# Patient Record
Sex: Female | Born: 1935 | Race: White | Hispanic: No | Smoking: Never smoker
Health system: Southern US, Community
[De-identification: ages and names within clinical notes are randomized; demographics above are authoritative.]

## PROBLEM LIST (undated history)

## (undated) DIAGNOSIS — R011 Cardiac murmur, unspecified: Secondary | ICD-10-CM

## (undated) DIAGNOSIS — E119 Type 2 diabetes mellitus without complications: Secondary | ICD-10-CM

## (undated) DIAGNOSIS — I10 Essential (primary) hypertension: Secondary | ICD-10-CM

## (undated) DIAGNOSIS — R42 Dizziness and giddiness: Secondary | ICD-10-CM

## (undated) DIAGNOSIS — I350 Nonrheumatic aortic (valve) stenosis: Secondary | ICD-10-CM

## (undated) DIAGNOSIS — K226 Gastro-esophageal laceration-hemorrhage syndrome: Secondary | ICD-10-CM

## (undated) DIAGNOSIS — M810 Age-related osteoporosis without current pathological fracture: Secondary | ICD-10-CM

## (undated) DIAGNOSIS — D62 Acute posthemorrhagic anemia: Secondary | ICD-10-CM

## (undated) HISTORY — DX: Nonrheumatic aortic (valve) stenosis: I35.0

## (undated) HISTORY — PX: CHOLECYSTECTOMY: SHX55

## (undated) HISTORY — PX: COLONOSCOPY: SHX174

## (undated) HISTORY — PX: TUBAL LIGATION: SHX77

## (undated) HISTORY — PX: SKIN CANCER EXCISION: SHX779

---

## 1998-08-11 ENCOUNTER — Other Ambulatory Visit: Admission: RE | Admit: 1998-08-11 | Discharge: 1998-08-11 | Payer: Self-pay | Admitting: Family Medicine

## 1999-06-21 ENCOUNTER — Encounter: Admission: RE | Admit: 1999-06-21 | Discharge: 1999-06-21 | Payer: Self-pay | Admitting: Family Medicine

## 1999-06-21 ENCOUNTER — Encounter: Payer: Self-pay | Admitting: Family Medicine

## 1999-08-15 ENCOUNTER — Encounter: Admission: RE | Admit: 1999-08-15 | Discharge: 1999-08-15 | Payer: Self-pay | Admitting: Family Medicine

## 1999-08-15 ENCOUNTER — Encounter: Payer: Self-pay | Admitting: Family Medicine

## 2000-06-24 ENCOUNTER — Encounter: Payer: Self-pay | Admitting: Family Medicine

## 2000-06-24 ENCOUNTER — Encounter: Admission: RE | Admit: 2000-06-24 | Discharge: 2000-06-24 | Payer: Self-pay | Admitting: Family Medicine

## 2000-08-06 ENCOUNTER — Other Ambulatory Visit: Admission: RE | Admit: 2000-08-06 | Discharge: 2000-08-06 | Payer: Self-pay | Admitting: Family Medicine

## 2001-06-25 ENCOUNTER — Encounter: Admission: RE | Admit: 2001-06-25 | Discharge: 2001-06-25 | Payer: Self-pay | Admitting: Family Medicine

## 2001-06-25 ENCOUNTER — Encounter: Payer: Self-pay | Admitting: Family Medicine

## 2001-08-12 ENCOUNTER — Other Ambulatory Visit: Admission: RE | Admit: 2001-08-12 | Discharge: 2001-08-12 | Payer: Self-pay | Admitting: Family Medicine

## 2001-08-15 ENCOUNTER — Encounter: Payer: Self-pay | Admitting: Family Medicine

## 2001-08-15 ENCOUNTER — Encounter: Admission: RE | Admit: 2001-08-15 | Discharge: 2001-08-15 | Payer: Self-pay | Admitting: Family Medicine

## 2002-02-23 ENCOUNTER — Encounter: Payer: Self-pay | Admitting: Family Medicine

## 2002-02-23 ENCOUNTER — Encounter: Admission: RE | Admit: 2002-02-23 | Discharge: 2002-02-23 | Payer: Self-pay | Admitting: Family Medicine

## 2002-06-30 ENCOUNTER — Encounter: Admission: RE | Admit: 2002-06-30 | Discharge: 2002-06-30 | Payer: Self-pay | Admitting: Family Medicine

## 2002-06-30 ENCOUNTER — Encounter: Payer: Self-pay | Admitting: Family Medicine

## 2002-08-14 ENCOUNTER — Other Ambulatory Visit: Admission: RE | Admit: 2002-08-14 | Discharge: 2002-08-14 | Payer: Self-pay | Admitting: Family Medicine

## 2003-07-01 ENCOUNTER — Encounter: Admission: RE | Admit: 2003-07-01 | Discharge: 2003-07-01 | Payer: Self-pay | Admitting: Family Medicine

## 2004-07-05 ENCOUNTER — Encounter: Admission: RE | Admit: 2004-07-05 | Discharge: 2004-07-05 | Payer: Self-pay | Admitting: Family Medicine

## 2004-08-29 ENCOUNTER — Other Ambulatory Visit: Admission: RE | Admit: 2004-08-29 | Discharge: 2004-08-29 | Payer: Self-pay | Admitting: Family Medicine

## 2005-01-02 ENCOUNTER — Encounter: Admission: RE | Admit: 2005-01-02 | Discharge: 2005-01-02 | Payer: Self-pay | Admitting: Family Medicine

## 2005-07-06 ENCOUNTER — Encounter: Admission: RE | Admit: 2005-07-06 | Discharge: 2005-07-06 | Payer: Self-pay | Admitting: Family Medicine

## 2006-07-08 ENCOUNTER — Encounter: Admission: RE | Admit: 2006-07-08 | Discharge: 2006-07-08 | Payer: Self-pay | Admitting: Family Medicine

## 2006-08-13 ENCOUNTER — Other Ambulatory Visit: Admission: RE | Admit: 2006-08-13 | Discharge: 2006-08-13 | Payer: Self-pay | Admitting: Family Medicine

## 2007-07-09 ENCOUNTER — Encounter: Admission: RE | Admit: 2007-07-09 | Discharge: 2007-07-09 | Payer: Self-pay | Admitting: Family Medicine

## 2007-07-18 ENCOUNTER — Encounter: Admission: RE | Admit: 2007-07-18 | Discharge: 2007-07-18 | Payer: Self-pay | Admitting: Family Medicine

## 2008-07-09 ENCOUNTER — Encounter: Admission: RE | Admit: 2008-07-09 | Discharge: 2008-07-09 | Payer: Self-pay | Admitting: Internal Medicine

## 2008-09-16 ENCOUNTER — Ambulatory Visit (HOSPITAL_COMMUNITY): Admission: RE | Admit: 2008-09-16 | Discharge: 2008-09-16 | Payer: Self-pay | Admitting: *Deleted

## 2009-07-11 ENCOUNTER — Encounter: Admission: RE | Admit: 2009-07-11 | Discharge: 2009-07-11 | Payer: Self-pay | Admitting: Internal Medicine

## 2010-06-06 ENCOUNTER — Other Ambulatory Visit: Payer: Self-pay | Admitting: *Deleted

## 2010-06-06 DIAGNOSIS — Z1231 Encounter for screening mammogram for malignant neoplasm of breast: Secondary | ICD-10-CM

## 2010-07-13 ENCOUNTER — Ambulatory Visit: Payer: Self-pay

## 2010-07-14 ENCOUNTER — Ambulatory Visit
Admission: RE | Admit: 2010-07-14 | Discharge: 2010-07-14 | Disposition: A | Discharge status: Home / Self Care | Payer: Medicare Other | Source: Ambulatory Visit | Attending: *Deleted | Admitting: *Deleted

## 2010-07-14 DIAGNOSIS — Z1231 Encounter for screening mammogram for malignant neoplasm of breast: Secondary | ICD-10-CM

## 2010-08-29 NOTE — Op Note (Signed)
[name], [name]               ACCOUNT NO.:  [id number]   MEDICAL RECORD NO.:  [id number]          PATIENT TYPE:  AMB   LOCATION:  ENDO                         FACILITY:  [facility]   PHYSICIAN:  Georgiana Spinner, M.D.    DATE OF BIRTH:  28-May-1934   DATE OF PROCEDURE:  DATE OF DISCHARGE:                               OPERATIVE REPORT   PROCEDURE:  Colonoscopy.   INDICATIONS:  Colon cancer screening.   ANESTHESIA:  Fentanyl 70 mcg, Versed 6 mg.   PROCEDURE:  With the patient mildly sedated in the left lateral  decubitus position the Pentax videoscopic pediatric colonoscope was  inserted in the rectum and passed under direct vision to the cecum  identified by ileocecal valve and appendiceal orifice.  We entered into  the terminal ileum very easily and this too was photographed and  appeared normal.  From this point the colonoscope was slowly withdrawn  taking circumferential views of colonic mucosa stopping only in the  rectum which appeared normal on direct and showed hemorrhoids on  retroflexed view.  The endoscope was straightened and withdrawn.  The  patient's vital signs, pulse oximeter remained stable.  The patient  tolerated procedure well without apparent complications.   FINDINGS:  Occasional diverticulum in the sigmoid colon.  Small internal  hemorrhoids.  Otherwise unremarkable colonoscopic examination to the  cecum.   PLAN:  Consider repeat examination 5-10 years.           ______________________________  Georgiana Spinner, M.D.     GMO/MEDQ  D:  09/16/2008  T:  09/16/2008  Job:  [id number]

## 2011-06-05 ENCOUNTER — Other Ambulatory Visit: Payer: Self-pay | Admitting: Internal Medicine

## 2011-06-05 DIAGNOSIS — Z1231 Encounter for screening mammogram for malignant neoplasm of breast: Secondary | ICD-10-CM

## 2011-07-16 ENCOUNTER — Ambulatory Visit
Admission: RE | Admit: 2011-07-16 | Discharge: 2011-07-16 | Disposition: A | Discharge status: Home / Self Care | Payer: Medicare Other | Source: Ambulatory Visit | Attending: Internal Medicine | Admitting: Internal Medicine

## 2011-07-16 DIAGNOSIS — Z1231 Encounter for screening mammogram for malignant neoplasm of breast: Secondary | ICD-10-CM

## 2012-06-09 ENCOUNTER — Other Ambulatory Visit: Payer: Self-pay | Admitting: Internal Medicine

## 2012-06-09 DIAGNOSIS — Z1231 Encounter for screening mammogram for malignant neoplasm of breast: Secondary | ICD-10-CM

## 2012-07-16 ENCOUNTER — Ambulatory Visit
Admission: RE | Admit: 2012-07-16 | Discharge: 2012-07-16 | Disposition: A | Discharge status: Home / Self Care | Payer: Medicare Other | Source: Ambulatory Visit | Attending: Internal Medicine | Admitting: Internal Medicine

## 2012-07-16 DIAGNOSIS — Z1231 Encounter for screening mammogram for malignant neoplasm of breast: Secondary | ICD-10-CM

## 2012-09-19 ENCOUNTER — Other Ambulatory Visit (HOSPITAL_COMMUNITY): Payer: Self-pay | Admitting: Internal Medicine

## 2012-09-19 ENCOUNTER — Ambulatory Visit (HOSPITAL_COMMUNITY)
Admission: RE | Admit: 2012-09-19 | Discharge: 2012-09-19 | Disposition: A | Discharge status: Home / Self Care | Payer: Medicare Other | Source: Ambulatory Visit | Attending: Cardiology | Admitting: Cardiology

## 2012-09-19 DIAGNOSIS — I080 Rheumatic disorders of both mitral and aortic valves: Secondary | ICD-10-CM | POA: Insufficient documentation

## 2012-09-19 DIAGNOSIS — I340 Nonrheumatic mitral (valve) insufficiency: Secondary | ICD-10-CM

## 2012-09-19 DIAGNOSIS — I059 Rheumatic mitral valve disease, unspecified: Secondary | ICD-10-CM

## 2012-09-19 DIAGNOSIS — R011 Cardiac murmur, unspecified: Secondary | ICD-10-CM | POA: Insufficient documentation

## 2012-09-19 DIAGNOSIS — E785 Hyperlipidemia, unspecified: Secondary | ICD-10-CM | POA: Insufficient documentation

## 2012-09-19 DIAGNOSIS — E119 Type 2 diabetes mellitus without complications: Secondary | ICD-10-CM | POA: Insufficient documentation

## 2012-09-19 NOTE — Progress Notes (Signed)
[facility]   2D echo completed 09/19/2012.   Cindy Mellonie Guess, RDCS  

## 2013-06-15 ENCOUNTER — Other Ambulatory Visit: Payer: Self-pay

## 2013-06-15 DIAGNOSIS — Z1231 Encounter for screening mammogram for malignant neoplasm of breast: Secondary | ICD-10-CM

## 2013-07-17 ENCOUNTER — Ambulatory Visit
Admission: RE | Admit: 2013-07-17 | Discharge: 2013-07-17 | Disposition: A | Discharge status: Home / Self Care | Payer: Medicare Other | Source: Ambulatory Visit

## 2013-07-17 DIAGNOSIS — Z1231 Encounter for screening mammogram for malignant neoplasm of breast: Secondary | ICD-10-CM

## 2014-06-14 ENCOUNTER — Other Ambulatory Visit: Payer: Self-pay

## 2014-06-14 DIAGNOSIS — Z1231 Encounter for screening mammogram for malignant neoplasm of breast: Secondary | ICD-10-CM

## 2014-07-20 ENCOUNTER — Ambulatory Visit
Admission: RE | Admit: 2014-07-20 | Discharge: 2014-07-20 | Disposition: A | Discharge status: Home / Self Care | Payer: Medicare Other | Source: Ambulatory Visit

## 2014-07-20 DIAGNOSIS — Z1231 Encounter for screening mammogram for malignant neoplasm of breast: Secondary | ICD-10-CM

## 2015-06-14 ENCOUNTER — Other Ambulatory Visit: Payer: Self-pay

## 2015-06-14 DIAGNOSIS — Z1231 Encounter for screening mammogram for malignant neoplasm of breast: Secondary | ICD-10-CM

## 2015-07-22 ENCOUNTER — Ambulatory Visit
Admission: RE | Admit: 2015-07-22 | Discharge: 2015-07-22 | Disposition: A | Discharge status: Home / Self Care | Payer: Medicare Other | Source: Ambulatory Visit

## 2015-07-22 DIAGNOSIS — Z1231 Encounter for screening mammogram for malignant neoplasm of breast: Secondary | ICD-10-CM

## 2016-02-03 ENCOUNTER — Other Ambulatory Visit: Payer: Self-pay | Admitting: Internal Medicine

## 2016-02-03 DIAGNOSIS — I351 Nonrheumatic aortic (valve) insufficiency: Secondary | ICD-10-CM

## 2016-02-22 ENCOUNTER — Other Ambulatory Visit: Payer: Self-pay

## 2016-02-22 ENCOUNTER — Ambulatory Visit (HOSPITAL_COMMUNITY): Payer: Medicare Other | Attending: Cardiology

## 2016-02-22 DIAGNOSIS — I352 Nonrheumatic aortic (valve) stenosis with insufficiency: Secondary | ICD-10-CM | POA: Insufficient documentation

## 2016-02-22 DIAGNOSIS — I351 Nonrheumatic aortic (valve) insufficiency: Secondary | ICD-10-CM

## 2016-02-22 DIAGNOSIS — I359 Nonrheumatic aortic valve disorder, unspecified: Secondary | ICD-10-CM | POA: Diagnosis present

## 2016-02-22 DIAGNOSIS — I340 Nonrheumatic mitral (valve) insufficiency: Secondary | ICD-10-CM | POA: Diagnosis not present

## 2016-06-11 ENCOUNTER — Other Ambulatory Visit: Payer: Self-pay | Admitting: Internal Medicine

## 2016-06-11 DIAGNOSIS — Z1231 Encounter for screening mammogram for malignant neoplasm of breast: Secondary | ICD-10-CM

## 2016-07-15 ENCOUNTER — Encounter (HOSPITAL_COMMUNITY): Payer: Self-pay | Admitting: Emergency Medicine

## 2016-07-15 ENCOUNTER — Emergency Department (HOSPITAL_COMMUNITY)
Admission: EM | Admit: 2016-07-15 | Discharge: 2016-07-16 | Disposition: A | Discharge status: Home / Self Care | Payer: Medicare Other | Attending: Emergency Medicine | Admitting: Emergency Medicine

## 2016-07-15 ENCOUNTER — Emergency Department (HOSPITAL_COMMUNITY): Payer: Medicare Other

## 2016-07-15 DIAGNOSIS — E860 Dehydration: Secondary | ICD-10-CM | POA: Diagnosis not present

## 2016-07-15 DIAGNOSIS — K226 Gastro-esophageal laceration-hemorrhage syndrome: Secondary | ICD-10-CM

## 2016-07-15 DIAGNOSIS — E119 Type 2 diabetes mellitus without complications: Secondary | ICD-10-CM | POA: Insufficient documentation

## 2016-07-15 DIAGNOSIS — R42 Dizziness and giddiness: Secondary | ICD-10-CM

## 2016-07-15 HISTORY — DX: Dizziness and giddiness: R42

## 2016-07-15 HISTORY — DX: Age-related osteoporosis without current pathological fracture: M81.0

## 2016-07-15 HISTORY — DX: Type 2 diabetes mellitus without complications: E11.9

## 2016-07-15 HISTORY — DX: Gastro-esophageal laceration-hemorrhage syndrome: K22.6

## 2016-07-15 HISTORY — DX: Cardiac murmur, unspecified: R01.1

## 2016-07-15 LAB — LIPASE, BLOOD: Lipase: <10 U/L — ABNORMAL LOW (ref 11–51)

## 2016-07-15 LAB — CBC
HEMATOCRIT: 37.8 % (ref 36.0–46.0)
HEMOGLOBIN: 13 g/dL (ref 12.0–15.0)
MCH: 28.8 pg (ref 26.0–34.0)
MCHC: 34.4 g/dL (ref 30.0–36.0)
MCV: 83.8 fL (ref 78.0–100.0)
Platelets: 249 10*3/uL (ref 150–400)
RBC: 4.51 MIL/uL (ref 3.87–5.11)
RDW: 13.2 % (ref 11.5–15.5)
WBC: 9.4 10*3/uL (ref 4.0–10.5)

## 2016-07-15 LAB — HEPATIC FUNCTION PANEL
ALBUMIN: 3.7 g/dL (ref 3.5–5.0)
ALK PHOS: 54 U/L (ref 38–126)
ALT: 18 U/L (ref 14–54)
AST: 27 U/L (ref 15–41)
BILIRUBIN TOTAL: 0.6 mg/dL (ref 0.3–1.2)
Bilirubin, Direct: <0.1 mg/dL — ABNORMAL LOW (ref 0.1–0.5)
Total Protein: 6.8 g/dL (ref 6.5–8.1)

## 2016-07-15 LAB — BASIC METABOLIC PANEL
ANION GAP: 10 (ref 5–15)
BUN: 13 mg/dL (ref 6–20)
CO2: 23 mmol/L (ref 22–32)
Calcium: 9 mg/dL (ref 8.9–10.3)
Chloride: 97 mmol/L — ABNORMAL LOW (ref 101–111)
Creatinine, Ser: 0.77 mg/dL (ref 0.44–1.00)
GFR calc Af Amer: >60 mL/min (ref >60)
Glucose, Bld: 173 mg/dL — ABNORMAL HIGH (ref 65–99)
POTASSIUM: 4.3 mmol/L (ref 3.5–5.1)
Sodium: 130 mmol/L — ABNORMAL LOW (ref 135–145)

## 2016-07-15 MED ORDER — SODIUM CHLORIDE 0.9 % IV BOLUS (SEPSIS)
1000.0000 mL | Freq: Once | INTRAVENOUS | Status: AC
  Administered 2016-07-15: 1000 mL via INTRAVENOUS

## 2016-07-15 NOTE — ED Provider Notes (Addendum)
Emergency Department Provider Note   I have reviewed the triage vital signs and the nursing notes.   HISTORY  Chief Complaint Dizziness   HPI Margaret Lucas is a 82 y.o. female with PMH of DM and vertigo presents to the ED for evaluation of generalized weakness, intermittent vertigo, and diarrhea with some mild lower abdominal cramping pain. The patient had generalized weakness that seemed to resolve but then returned yesterday. She denies any CP or SOB. No fever or chills. No sick contacts. Patient continued to have intermittent diarrhea and some lower abdominal cramping pain. No radiation. She notes return of her vertigo symptoms. She states is similar to vertigo she's had in the past. Not worse with movement. No other neurological symptoms. No prior CVA. Symptoms are moderate.    Past Medical History:  Diagnosis Date  . Diabetes mellitus without complication (HCC)   . Heart murmur   . Osteoporosis   . Vertigo     There are no active problems to display for this patient.   Past Surgical History:  Procedure Laterality Date  . CHOLECYSTECTOMY      Current Outpatient Rx  . Order #: [id number] Class: Historical Med  . Order #: [id number] Class: Historical Med  . Order #: [id number] Class: Historical Med  . Order #: [id number] Class: Historical Med  . Order #: [id number] Class: Historical Med  . Order #: [id number] Class: Historical Med  . Order #: [id number] Class: Historical Med  . Order #: [id number] Class: Print    Allergies Demerol [meperidine]  No family history on file.  Social History Social History  Substance Use Topics  . Smoking status: Never Smoker  . Smokeless tobacco: Never Used  . Alcohol use No    Review of Systems  10-point ROS otherwise negative.  ____________________________________________   PHYSICAL EXAM:  VITAL SIGNS: ED Triage Vitals  Enc Vitals Group     BP 07/15/16 2057 (!) 197/63     Pulse Rate 07/15/16 2057 71     Resp 07/15/16 2057  16     Temp 07/15/16 2057 97.7 F (36.5 C)     Temp Source 07/15/16 2057 Oral     SpO2 07/15/16 2057 100 %     Weight 07/15/16 2057 124 lb (56.2 kg)     Height 07/15/16 2057  (1.6 m)     Pain Score 07/15/16 2105 0   Constitutional: Alert and oriented. Well appearing and in no acute distress. Eyes: Conjunctivae are normal. PERRL. EOMI. Head: Atraumatic. Nose: No congestion/rhinnorhea. Mouth/Throat: Mucous membranes are moist.  Oropharynx non-erythematous. Neck: No stridor.   Cardiovascular: Normal rate, regular rhythm. Good peripheral circulation. Grossly normal heart sounds.   Respiratory: Normal respiratory effort.  No retractions. Lungs CTAB. Gastrointestinal: Soft and nontender. No distention.  Musculoskeletal: No lower extremity tenderness nor edema. No gross deformities of extremities. Neurologic:  Normal speech and language. No gross focal neurologic deficits are appreciated. Normal CN exam 2-12. Normal finger-to-nose testing. Normal gait.  Skin:  Skin is warm, dry and intact. No rash noted. Psychiatric: Mood and affect are normal. Speech and behavior are normal.  ____________________________________________   LABS (all labs ordered are listed, but only abnormal results are displayed)  Labs Reviewed  BASIC METABOLIC PANEL - Abnormal; Notable for the following:       Result Value   Sodium 130 (*)    Chloride 97 (*)    Glucose, Bld 173 (*)    All other components within normal limits  URINALYSIS, ROUTINE W REFLEX MICROSCOPIC - Abnormal;  Notable for the following:    Color, Urine STRAW (*)    Specific Gravity, Urine 1.004 (*)    Leukocytes, UA TRACE (*)    Squamous Epithelial / LPF 0-5 (*)    All other components within normal limits  HEPATIC FUNCTION PANEL - Abnormal; Notable for the following:    Bilirubin, Direct <0.1 (*)    All other components within normal limits  LIPASE, BLOOD - Abnormal; Notable for the following:    Lipase <10 (*)    All other components  within normal limits  CBC   ____________________________________________  EKG   EKG Interpretation  Date/Time:  Sunday July 15 2016 21:05:44 EDT Ventricular Rate:  71 PR Interval:  174 QRS Duration: 134 QT Interval:  408 QTC Calculation: 443 R Axis:   -177 Text Interpretation:  Normal sinus rhythm Right bundle branch block Abnormal ECG no prior EKG  Confirmed by LIU MD, [name] ([id number]) on 07/15/2016 9:11:04 PM Also confirmed by Verdie Mosher MD, DANA [phone]), editor [place]  CCT, BEVERLY ([id number])  on 07/16/2016 7:06:46 AM       ____________________________________________  RADIOLOGY  Dg Chest 2 View  Result Date: 07/15/2016 CLINICAL DATA:  Cough and fatigue. EXAM: CHEST  2 VIEW COMPARISON:  09/27/2009 FINDINGS: The heart size and mediastinal contours are within normal limits. Both lungs are clear. The visualized skeletal structures are unremarkable. IMPRESSION: No active cardiopulmonary disease. Electronically Signed   By: Ellery Plunk M.D.   On: 07/15/2016 23:42    ____________________________________________   PROCEDURES  Procedure(s) performed:   Procedures  None ____________________________________________   INITIAL IMPRESSION / ASSESSMENT AND PLAN / ED COURSE  Pertinent labs & imaging results that were available during my care of the patient were reviewed by me and considered in my medical decision making (see chart for details).  Patient presents to the emergency department for evaluation of generalized weakness in the setting of diarrhea and likely dehydration. She is having intermittent vertigo that is made worse with movement. No focal neurological deficits. Patient has a history of vertigo but ran out of her meclizine which typically improves her symptoms. No vertigo currently. No chest pain or other ACS equivalent. Very low suspicion for central vertigo. Plan for labs, IVF, and reassessment.   01:15 AM Patient is feeling much better after IV fluids. No significant  lab abnormalities. No evidence of underlying infection. Normal chest x-ray with history of cough. Plan for PO trial and ambulation in the ED. With patient feeling symptomatically better will discharge with PCP follow up.   02:23 AM Patient has tolerated PO and ambulated in the ED without difficulty. Plan for discharge with Meclizine and PCP follow up.   At this time, I do not feel there is any life-threatening condition present. I have reviewed and discussed all results (EKG, imaging, lab, urine as appropriate), exam findings with patient. I have reviewed nursing notes and appropriate previous records.  I feel the patient is safe to be discharged home without further emergent workup. Discussed usual and customary return precautions. Patient and family (if present) verbalize understanding and are comfortable with this plan.  Patient will follow-up with their primary care provider. If they do not have a primary care provider, information for follow-up has been provided to them. All questions have been answered.  ____________________________________________  FINAL CLINICAL IMPRESSION(S) / ED DIAGNOSES  Final diagnoses:  Dehydration  Vertigo  Lightheadedness     MEDICATIONS GIVEN DURING THIS VISIT:  Medications  sodium chloride 0.9 % bolus 1,000  mL (0 mLs Intravenous Stopped 07/16/16 0109)     NEW OUTPATIENT MEDICATIONS STARTED DURING THIS VISIT:  Discharge Medication List as of 07/16/2016  2:26 AM    START taking these medications   Details  meclizine (ANTIVERT) 25 MG tablet Take 1 tablet (25 mg total) by mouth 3 (three) times daily as needed for dizziness., Starting Mon 07/16/2016, Print          Note:  This document was prepared using Dragon voice recognition software and may include unintentional dictation errors.  Alona Bene, MD Emergency Medicine   Maia Plan, MD 07/16/16 1444    Maia Plan, MD 07/16/16 [phone]

## 2016-07-15 NOTE — ED Notes (Signed)
Pt reports she took her BP at home today and got a reading of 204/88. She reports her mother died of a massive stroke so this scared her. Pt reports symptoms that started on Friday evening and have not resolved. Symptoms pt reports are hot flashes, nausea with vomiting once this morning, headache, vertigo, and weakness/ not feeling well. She states she has had decreased intake as well.

## 2016-07-15 NOTE — ED Notes (Signed)
Called lab. Requested Hepatic function panel and Lipase to be added onto previous labs. Lab in agreement.

## 2016-07-15 NOTE — ED Notes (Signed)
Pt to BR for urine sample, episode of malodorous diarrhea. Pt states abd feels slightly better after BM. Pt denies seeing blood in stool, does report BM this afternoon had strong foul odor as well.

## 2016-07-15 NOTE — ED Notes (Signed)
ED Provider at bedside. 

## 2016-07-15 NOTE — ED Notes (Signed)
Patient transported to X-ray 

## 2016-07-15 NOTE — ED Triage Notes (Signed)
Pt brought to ED by family friend. Pt reports dizziness onset yesterday with one episode of vomiting today, nausea yesterday, relieved with rest. Pt does have hx of vertigo and states this feels similar to previous episodes. Pt did take dramamine  today, pt states this did relieve some s/s. Pt reports lower abd pain onset 3 days ago, pt reports she does have diarrhea historically with vertigo.  Pt A & O, neuro intact.

## 2016-07-16 LAB — URINALYSIS, ROUTINE W REFLEX MICROSCOPIC
BACTERIA UA: NONE SEEN
Bilirubin Urine: NEGATIVE
Glucose, UA: NEGATIVE mg/dL
Hgb urine dipstick: NEGATIVE
Ketones, ur: NEGATIVE mg/dL
Nitrite: NEGATIVE
PH: 6 (ref 5.0–8.0)
Protein, ur: NEGATIVE mg/dL
SPECIFIC GRAVITY, URINE: 1.004 — AB (ref 1.005–1.030)

## 2016-07-16 MED ORDER — MECLIZINE HCL 25 MG PO TABS: 25.0000 mg | ORAL_TABLET | Freq: Three times a day (TID) | ORAL | 0 refills | Status: DC | PRN

## 2016-07-16 NOTE — Discharge Instructions (Signed)
We believe your symptoms were caused by benign vertigo.  Please read through the included information and take any prescribed medication(s).  Follow up with your doctor as listed above.  If you develop any new or worsening symptoms that concern you, including but not limited to persistent dizziness/vertigo, numbness or weakness in your arms or legs, altered mental status, persistent vomiting, or fever greater than 101, please return immediately to the Emergency Department.  

## 2016-07-16 NOTE — ED Notes (Signed)
Dr. Long at bedside at this time.  

## 2016-07-16 NOTE — ED Notes (Signed)
Patient ambulated with no difficulty.  Patient stated that when she initially stood up she felt a little dizzy. Seconds later she was ready to ambulate, and did so with no further complaints. Patient had steady gate to and from the restroom.

## 2016-07-24 ENCOUNTER — Ambulatory Visit
Admission: RE | Admit: 2016-07-24 | Discharge: 2016-07-24 | Disposition: A | Discharge status: Home / Self Care | Payer: Medicare Other | Source: Ambulatory Visit | Attending: Internal Medicine | Admitting: Internal Medicine

## 2016-07-24 DIAGNOSIS — Z1231 Encounter for screening mammogram for malignant neoplasm of breast: Secondary | ICD-10-CM

## 2016-08-07 ENCOUNTER — Inpatient Hospital Stay (HOSPITAL_COMMUNITY)
Admission: EM | Admit: 2016-08-07 | Discharge: 2016-08-11 | DRG: 369 | Disposition: A | Discharge status: Home / Self Care | Payer: Medicare Other | Attending: Internal Medicine | Admitting: Internal Medicine

## 2016-08-07 ENCOUNTER — Emergency Department (HOSPITAL_COMMUNITY): Payer: Medicare Other

## 2016-08-07 ENCOUNTER — Encounter (HOSPITAL_COMMUNITY): Payer: Self-pay | Admitting: Family Medicine

## 2016-08-07 DIAGNOSIS — M810 Age-related osteoporosis without current pathological fracture: Secondary | ICD-10-CM | POA: Diagnosis present

## 2016-08-07 DIAGNOSIS — R7989 Other specified abnormal findings of blood chemistry: Secondary | ICD-10-CM

## 2016-08-07 DIAGNOSIS — I4510 Unspecified right bundle-branch block: Secondary | ICD-10-CM | POA: Diagnosis present

## 2016-08-07 DIAGNOSIS — K922 Gastrointestinal hemorrhage, unspecified: Secondary | ICD-10-CM | POA: Diagnosis not present

## 2016-08-07 DIAGNOSIS — E871 Hypo-osmolality and hyponatremia: Secondary | ICD-10-CM

## 2016-08-07 DIAGNOSIS — E876 Hypokalemia: Secondary | ICD-10-CM | POA: Diagnosis present

## 2016-08-07 DIAGNOSIS — I10 Essential (primary) hypertension: Secondary | ICD-10-CM | POA: Diagnosis present

## 2016-08-07 DIAGNOSIS — E119 Type 2 diabetes mellitus without complications: Secondary | ICD-10-CM | POA: Diagnosis present

## 2016-08-07 DIAGNOSIS — Z7951 Long term (current) use of inhaled steroids: Secondary | ICD-10-CM

## 2016-08-07 DIAGNOSIS — E872 Acidosis, unspecified: Secondary | ICD-10-CM

## 2016-08-07 DIAGNOSIS — R402362 Coma scale, best motor response, obeys commands, at arrival to emergency department: Secondary | ICD-10-CM | POA: Diagnosis present

## 2016-08-07 DIAGNOSIS — K3182 Dieulafoy lesion (hemorrhagic) of stomach and duodenum: Secondary | ICD-10-CM | POA: Diagnosis present

## 2016-08-07 DIAGNOSIS — R030 Elevated blood-pressure reading, without diagnosis of hypertension: Secondary | ICD-10-CM

## 2016-08-07 DIAGNOSIS — Z885 Allergy status to narcotic agent status: Secondary | ICD-10-CM | POA: Diagnosis not present

## 2016-08-07 DIAGNOSIS — R402252 Coma scale, best verbal response, oriented, at arrival to emergency department: Secondary | ICD-10-CM | POA: Diagnosis present

## 2016-08-07 DIAGNOSIS — D62 Acute posthemorrhagic anemia: Secondary | ICD-10-CM | POA: Diagnosis present

## 2016-08-07 DIAGNOSIS — K920 Hematemesis: Secondary | ICD-10-CM

## 2016-08-07 DIAGNOSIS — Z79899 Other long term (current) drug therapy: Secondary | ICD-10-CM

## 2016-08-07 DIAGNOSIS — Z7984 Long term (current) use of oral hypoglycemic drugs: Secondary | ICD-10-CM

## 2016-08-07 DIAGNOSIS — R402142 Coma scale, eyes open, spontaneous, at arrival to emergency department: Secondary | ICD-10-CM | POA: Diagnosis present

## 2016-08-07 DIAGNOSIS — D72829 Elevated white blood cell count, unspecified: Secondary | ICD-10-CM | POA: Diagnosis not present

## 2016-08-07 DIAGNOSIS — Z803 Family history of malignant neoplasm of breast: Secondary | ICD-10-CM

## 2016-08-07 DIAGNOSIS — K226 Gastro-esophageal laceration-hemorrhage syndrome: Secondary | ICD-10-CM | POA: Diagnosis present

## 2016-08-07 DIAGNOSIS — Z85828 Personal history of other malignant neoplasm of skin: Secondary | ICD-10-CM

## 2016-08-07 DIAGNOSIS — Z9049 Acquired absence of other specified parts of digestive tract: Secondary | ICD-10-CM | POA: Diagnosis not present

## 2016-08-07 DIAGNOSIS — Z823 Family history of stroke: Secondary | ICD-10-CM | POA: Diagnosis not present

## 2016-08-07 DIAGNOSIS — R54 Age-related physical debility: Secondary | ICD-10-CM | POA: Diagnosis present

## 2016-08-07 DIAGNOSIS — E039 Hypothyroidism, unspecified: Secondary | ICD-10-CM | POA: Diagnosis present

## 2016-08-07 DIAGNOSIS — Z7982 Long term (current) use of aspirin: Secondary | ICD-10-CM

## 2016-08-07 DIAGNOSIS — R945 Abnormal results of liver function studies: Secondary | ICD-10-CM

## 2016-08-07 DIAGNOSIS — Z972 Presence of dental prosthetic device (complete) (partial): Secondary | ICD-10-CM

## 2016-08-07 DIAGNOSIS — R911 Solitary pulmonary nodule: Secondary | ICD-10-CM | POA: Diagnosis present

## 2016-08-07 DIAGNOSIS — N390 Urinary tract infection, site not specified: Secondary | ICD-10-CM | POA: Diagnosis present

## 2016-08-07 HISTORY — DX: Essential (primary) hypertension: I10

## 2016-08-07 HISTORY — DX: Gastrointestinal hemorrhage, unspecified: K92.2

## 2016-08-07 HISTORY — DX: Other specified abnormal findings of blood chemistry: R79.89

## 2016-08-07 HISTORY — DX: Gastro-esophageal laceration-hemorrhage syndrome: K22.6

## 2016-08-07 HISTORY — DX: Hypo-osmolality and hyponatremia: E87.1

## 2016-08-07 LAB — LIPASE, BLOOD: LIPASE: 18 U/L (ref 11–51)

## 2016-08-07 LAB — COMPREHENSIVE METABOLIC PANEL
ALT: 23 U/L (ref 14–54)
ANION GAP: 11 (ref 5–15)
AST: 43 U/L — ABNORMAL HIGH (ref 15–41)
Albumin: 3.8 g/dL (ref 3.5–5.0)
Alkaline Phosphatase: 60 U/L (ref 38–126)
BUN: 10 mg/dL (ref 6–20)
CHLORIDE: 86 mmol/L — AB (ref 101–111)
CO2: 21 mmol/L — ABNORMAL LOW (ref 22–32)
CREATININE: 0.62 mg/dL (ref 0.44–1.00)
Calcium: 8.5 mg/dL — ABNORMAL LOW (ref 8.9–10.3)
Glucose, Bld: 171 mg/dL — ABNORMAL HIGH (ref 65–99)
POTASSIUM: 4.4 mmol/L (ref 3.5–5.1)
Sodium: 118 mmol/L — CL (ref 135–145)
Total Bilirubin: 1.4 mg/dL — ABNORMAL HIGH (ref 0.3–1.2)
Total Protein: 6.9 g/dL (ref 6.5–8.1)

## 2016-08-07 LAB — CBC
HEMATOCRIT: 36.4 % (ref 36.0–46.0)
HEMOGLOBIN: 12.8 g/dL (ref 12.0–15.0)
MCH: 29 pg (ref 26.0–34.0)
MCHC: 35.2 g/dL (ref 30.0–36.0)
MCV: 82.4 fL (ref 78.0–100.0)
PLATELETS: 276 10*3/uL (ref 150–400)
RBC: 4.42 MIL/uL (ref 3.87–5.11)
RDW: 12.8 % (ref 11.5–15.5)
WBC: 20.8 10*3/uL — AB (ref 4.0–10.5)

## 2016-08-07 LAB — I-STAT TROPONIN, ED
Troponin i, poc: 0 ng/mL (ref 0.00–0.08)
Troponin i, poc: 0 ng/mL (ref 0.00–0.08)

## 2016-08-07 LAB — TYPE AND SCREEN
ABO/RH(D): O POS
Antibody Screen: NEGATIVE

## 2016-08-07 LAB — I-STAT CG4 LACTIC ACID, ED
Lactic Acid, Venous: 2.74 mmol/L (ref 0.5–1.9)
Lactic Acid, Venous: 1.24 mmol/L (ref 0.5–1.9)

## 2016-08-07 LAB — BRAIN NATRIURETIC PEPTIDE: B Natriuretic Peptide: 67.6 pg/mL (ref 0.0–100.0)

## 2016-08-07 LAB — ABO/RH: ABO/RH(D): O POS

## 2016-08-07 MED ORDER — PANTOPRAZOLE SODIUM 40 MG IV SOLR
40.0000 mg | Freq: Once | INTRAVENOUS | Status: AC
  Administered 2016-08-07: 40 mg via INTRAVENOUS
  Filled 2016-08-07: qty 40

## 2016-08-07 MED ORDER — IOPAMIDOL (ISOVUE-300) INJECTION 61%
INTRAVENOUS | Status: AC
  Administered 2016-08-07: 100 mL
  Filled 2016-08-07: qty 100

## 2016-08-07 MED ORDER — PANTOPRAZOLE SODIUM 40 MG IV SOLR
40.0000 mg | Freq: Two times a day (BID) | INTRAVENOUS | Status: DC
  Administered 2016-08-11: 40 mg via INTRAVENOUS
  Filled 2016-08-07 (×2): qty 40

## 2016-08-07 MED ORDER — HYDRALAZINE HCL 20 MG/ML IJ SOLN
10.0000 mg | Freq: Once | INTRAMUSCULAR | Status: DC
  Filled 2016-08-07: qty 1

## 2016-08-07 MED ORDER — SODIUM CHLORIDE 0.9 % IV BOLUS (SEPSIS)
1000.0000 mL | Freq: Once | INTRAVENOUS | Status: AC
  Administered 2016-08-07: 1000 mL via INTRAVENOUS

## 2016-08-07 MED ORDER — ONDANSETRON HCL 4 MG/2ML IJ SOLN
4.0000 mg | Freq: Once | INTRAMUSCULAR | Status: AC
  Administered 2016-08-07: 4 mg via INTRAVENOUS
  Filled 2016-08-07: qty 2

## 2016-08-07 MED ORDER — SODIUM CHLORIDE 0.9 % IV SOLN
8.0000 mg/h | INTRAVENOUS | Status: DC
  Administered 2016-08-08 – 2016-08-09 (×4): 8 mg/h via INTRAVENOUS
  Filled 2016-08-07 (×7): qty 80

## 2016-08-07 NOTE — ED Provider Notes (Signed)
[facility]-EMERGENCY DEPT Provider Note   CSN: [id number] Arrival date & time: 08/07/16  1948     History   Chief Complaint Chief Complaint  Patient presents with  . GI Bleeding  . Emesis    HPI Margaret Lucas is a 82 y.o. female.  The history is provided by the patient and a friend.  Emesis   This is a new problem. The current episode started 1 to 2 hours ago. The problem occurs 2 to 4 times per day. The problem has been gradually worsening. The emesis has an appearance of stomach contents and bright red blood. There has been no fever. Associated symptoms include chills and diarrhea. Pertinent negatives include no abdominal pain, no arthralgias, no cough, no fever, no headaches, no sweats and no URI. Risk factors: Pt with elevated blood pressure.    Past Medical History:  Diagnosis Date  . Diabetes mellitus without complication (HCC)   . Heart murmur   . Osteoporosis   . Vertigo     There are no active problems to display for this patient.   Past Surgical History:  Procedure Laterality Date  . CHOLECYSTECTOMY      OB History    No data available       Home Medications    Prior to Admission medications   Medication Sig Start Date End Date Taking? Authorizing Provider  aspirin EC 81 MG tablet Take 81 mg by mouth daily.   Yes Historical Provider, MD  Calcium Carbonate-Vitamin D (CALCIUM 600+D) 600-200 MG-UNIT TABS Take 1 tablet by mouth daily.   Yes Historical Provider, MD  cholecalciferol (VITAMIN D) 1000 units tablet Take 5,000 Units by mouth daily.   Yes Historical Provider, MD  fexofenadine-pseudoephedrine (ALLEGRA-D) 60-120 MG 12 hr tablet Take 1 tablet by mouth every other day.   Yes Historical Provider, MD  fluticasone (FLONASE) 50 MCG/ACT nasal spray Place 1-2 sprays into both nostrils daily.   Yes Historical Provider, MD  hydrochlorothiazide (MICROZIDE) 12.5 MG capsule Take 12.5 mg by mouth daily.   Yes Historical Provider, MD  levothyroxine (SYNTHROID,  LEVOTHROID) 100 MCG tablet Take 100 mcg by mouth daily before breakfast.   Yes Historical Provider, MD  meclizine (ANTIVERT) 25 MG tablet Take 1 tablet (25 mg total) by mouth 3 (three) times daily as needed for dizziness. 07/16/16  Yes Maia Plan, MD  raloxifene (EVISTA) 60 MG tablet Take 60 mg by mouth daily.   Yes Historical Provider, MD    Family History No family history on file.  Social History Social History  Substance Use Topics  . Smoking status: Never Smoker  . Smokeless tobacco: Never Used  . Alcohol use No     Allergies   Demerol [meperidine]   Review of Systems Review of Systems  Constitutional: Positive for chills and fatigue. Negative for fever.  HENT: Negative for ear pain and sore throat.   Eyes: Negative for pain and visual disturbance.  Respiratory: Negative for cough and shortness of breath.   Cardiovascular: Negative for chest pain and palpitations.  Gastrointestinal: Positive for blood in stool, diarrhea, nausea and vomiting. Negative for abdominal pain.  Genitourinary: Negative for dysuria and hematuria.  Musculoskeletal: Negative for arthralgias and back pain.  Skin: Negative for color change and rash.  Neurological: Negative for seizures, syncope and headaches.  All other systems reviewed and are negative.    Physical Exam Updated Vital Signs BP (!) 126/96   Pulse 74   Temp 99 F (37.2 C) (Oral)  Resp (!) 21   Ht  (1.6 m)   Wt 55.3 kg   SpO2 99%   BMI 21.61 kg/m   Physical Exam  Constitutional: She appears well-developed and well-nourished. She appears ill. No distress.  HENT:  Head: Normocephalic and atraumatic.    Eyes: Conjunctivae and EOM are normal.  Neck: Neck supple.  Cardiovascular: Normal rate, regular rhythm and intact distal pulses.   No murmur heard. Pulmonary/Chest: Effort normal and breath sounds normal. No respiratory distress.  Abdominal: Soft. There is no tenderness.  Genitourinary: Rectal exam shows no  external hemorrhoid, no internal hemorrhoid, no tenderness and guaiac negative stool.  Musculoskeletal: She exhibits no edema.  Neurological: She is alert. GCS eye subscore is 4. GCS verbal subscore is 5. GCS motor subscore is 6.  Skin: Skin is warm and dry.  Psychiatric: She has a normal mood and affect.  Nursing note and vitals reviewed.    ED Treatments / Results  Labs (all labs ordered are listed, but only abnormal results are displayed) Labs Reviewed  COMPREHENSIVE METABOLIC PANEL - Abnormal; Notable for the following:       Result Value   Sodium 118 (*)    Chloride 86 (*)    CO2 21 (*)    Glucose, Bld 171 (*)    Calcium 8.5 (*)    AST 43 (*)    Total Bilirubin 1.4 (*)    All other components within normal limits  CBC - Abnormal; Notable for the following:    WBC 20.8 (*)    All other components within normal limits  I-STAT CG4 LACTIC ACID, ED - Abnormal; Notable for the following:    Lactic Acid, Venous 2.74 (*)    All other components within normal limits  LIPASE, BLOOD  BRAIN NATRIURETIC PEPTIDE  I-STAT TROPOININ, ED  POC OCCULT BLOOD, ED  I-STAT CG4 LACTIC ACID, ED  I-STAT TROPOININ, ED  TYPE AND SCREEN  ABO/RH    EKG  EKG Interpretation  Date/Time:  Tuesday August 07 2016 20:01:09 EDT Ventricular Rate:  82 PR Interval:    QRS Duration: 149 QT Interval:  415 QTC Calculation: 485 R Axis:   121 Text Interpretation:  Sinus rhythm Multiple premature complexes, vent & supraven Right bundle branch block No significant change since last tracing Confirmed by KNAPP  MD-J, JON ([id number]) on 08/07/2016 8:10:53 PM       Radiology Ct Chest W Contrast  Result Date: 08/07/2016 CLINICAL DATA:  Bloody emesis EXAM: CT CHEST, ABDOMEN, AND PELVIS WITH CONTRAST TECHNIQUE: Multidetector CT imaging of the chest, abdomen and pelvis was performed following the standard protocol during bolus administration of intravenous contrast. CONTRAST:  ISOVUE-300 IOPAMIDOL (ISOVUE-300)  INJECTION 61% COMPARISON:  Chest radiograph 07/15/2016 FINDINGS: CT CHEST FINDINGS Cardiovascular: No significant vascular findings. Normal heart size. No pericardial effusion. Mediastinum/Nodes: No mediastinal, hilar or axillary lymphadenopathy. The visualized thyroid and thoracic esophageal course are unremarkable. Lungs/Pleura: Biapical scarring. No pleural effusion or pneumothorax. 4 mm nodule at the right lung base posteriorly. Musculoskeletal: Multilevel thoracic osteophytosis. No bony spinal canal stenosis. No rib fracture. CT ABDOMEN PELVIS FINDINGS Hepatobiliary: Status post cholecystectomy. Hypoattenuation of the liver compared to the spleen suggest hepatic steatosis. No biliary dilatation or focal hepatic lesion. Pancreas: Normal Spleen: Normal Adrenals/Urinary Tract: The adrenal glands are normal. There is no hydronephrosis or nephrolithiasis. No perinephric stranding or solid renal mass. Stomach/Bowel: There is a curvilinear focus of hyperdensity of equal attenuation to blood that courses from the gastroesophageal junction into  the gastric lumen. This has the appearance of a artery, but it is not in the gastric wall or any solid viscus and therefore may be active extravasation (axial images 48-51, coronal image 57, sagittal image 110) the. There is a small duodenal diverticulum. The remainder the duodenum is normal. There is no small bowel obstruction or acute inflammation. There is sigmoid diverticulosis without acute inflammation. The colon is mostly decompressed. The appendix is normal. Vascular/Lymphatic: There is mixed calcified and noncalcified plaque within the abdominal aorta. No abdominal or pelvic lymphadenopathy. Reproductive: The uterus is normal. No adnexal mass. No free fluid in the pelvis. Other: None Musculoskeletal: Multilevel lumbar osteophytosis and facet arthrosis. No bony spinal canal stenosis. IMPRESSION: 1. Serpiginous hyperdensity within the gastric lumen, which has the  appearance of a medium-sized blood vessel, but is not within the gastric wall and is thus suspicious for active hemorrhage. This could be a Dieulafoy lesion within the submucosa of the upper gastric wall, actively bleeding into the stomach. Another consideration would be a pedunculated gastric mass. Correlation with upper endoscopy should be considered. 2. No other acute abnormality of the chest, abdomen or pelvis. 3. Aortic atherosclerosis. 4. 4 mm posterior right lung base nodule. No follow-up needed if patient is low-risk. Non-contrast chest CT can be considered in 12 months if patient is high-risk. This recommendation follows the consensus statement: Guidelines for Management of Incidental Pulmonary Nodules Detected on CT Images: From the Fleischner Society 2017; Radiology 2017; 284:228-243. Critical Value/emergent results were called by telephone at the time of interpretation on 08/07/2016 at 10:20 pm to Dr. Linwood Dibbles , who verbally acknowledged these results. Electronically Signed   By: Deatra Robinson M.D.   On: 08/07/2016 22:27   Ct Abdomen Pelvis W Contrast  Result Date: 08/07/2016 CLINICAL DATA:  Bloody emesis EXAM: CT CHEST, ABDOMEN, AND PELVIS WITH CONTRAST TECHNIQUE: Multidetector CT imaging of the chest, abdomen and pelvis was performed following the standard protocol during bolus administration of intravenous contrast. CONTRAST:  ISOVUE-300 IOPAMIDOL (ISOVUE-300) INJECTION 61% COMPARISON:  Chest radiograph 07/15/2016 FINDINGS: CT CHEST FINDINGS Cardiovascular: No significant vascular findings. Normal heart size. No pericardial effusion. Mediastinum/Nodes: No mediastinal, hilar or axillary lymphadenopathy. The visualized thyroid and thoracic esophageal course are unremarkable. Lungs/Pleura: Biapical scarring. No pleural effusion or pneumothorax. 4 mm nodule at the right lung base posteriorly. Musculoskeletal: Multilevel thoracic osteophytosis. No bony spinal canal stenosis. No rib fracture. CT  ABDOMEN PELVIS FINDINGS Hepatobiliary: Status post cholecystectomy. Hypoattenuation of the liver compared to the spleen suggest hepatic steatosis. No biliary dilatation or focal hepatic lesion. Pancreas: Normal Spleen: Normal Adrenals/Urinary Tract: The adrenal glands are normal. There is no hydronephrosis or nephrolithiasis. No perinephric stranding or solid renal mass. Stomach/Bowel: There is a curvilinear focus of hyperdensity of equal attenuation to blood that courses from the gastroesophageal junction into the gastric lumen. This has the appearance of a artery, but it is not in the gastric wall or any solid viscus and therefore may be active extravasation (axial images 48-51, coronal image 57, sagittal image 110) the. There is a small duodenal diverticulum. The remainder the duodenum is normal. There is no small bowel obstruction or acute inflammation. There is sigmoid diverticulosis without acute inflammation. The colon is mostly decompressed. The appendix is normal. Vascular/Lymphatic: There is mixed calcified and noncalcified plaque within the abdominal aorta. No abdominal or pelvic lymphadenopathy. Reproductive: The uterus is normal. No adnexal mass. No free fluid in the pelvis. Other: None Musculoskeletal: Multilevel lumbar osteophytosis and facet arthrosis. No  bony spinal canal stenosis. IMPRESSION: 1. Serpiginous hyperdensity within the gastric lumen, which has the appearance of a medium-sized blood vessel, but is not within the gastric wall and is thus suspicious for active hemorrhage. This could be a Dieulafoy lesion within the submucosa of the upper gastric wall, actively bleeding into the stomach. Another consideration would be a pedunculated gastric mass. Correlation with upper endoscopy should be considered. 2. No other acute abnormality of the chest, abdomen or pelvis. 3. Aortic atherosclerosis. 4. 4 mm posterior right lung base nodule. No follow-up needed if patient is low-risk. Non-contrast  chest CT can be considered in 12 months if patient is high-risk. This recommendation follows the consensus statement: Guidelines for Management of Incidental Pulmonary Nodules Detected on CT Images: From the Fleischner Society 2017; Radiology 2017; 284:228-243. Critical Value/emergent results were called by telephone at the time of interpretation on 08/07/2016 at 10:20 pm to Dr. Linwood Dibbles , who verbally acknowledged these results. Electronically Signed   By: Deatra Robinson M.D.   On: 08/07/2016 22:27    Procedures Procedures (including critical care time)  Medications Ordered in ED Medications  pantoprazole (PROTONIX) 80 mg in sodium chloride 0.9 % 250 mL (0.32 mg/mL) infusion (not administered)  pantoprazole (PROTONIX) injection 40 mg (not administered)  pantoprazole (PROTONIX) injection 40 mg (40 mg Intravenous Given 08/07/16 2038)  ondansetron (ZOFRAN) injection 4 mg (4 mg Intravenous Given 08/07/16 2038)  sodium chloride 0.9 % bolus 1,000 mL (0 mLs Intravenous Stopped 08/07/16 2236)  iopamidol (ISOVUE-300) 61 % injection (100 mLs  Contrast Given 08/07/16 2137)     Initial Impression / Assessment and Plan / ED Course  I have reviewed the triage vital signs and the nursing notes.  Pertinent labs & imaging results that were available during my care of the patient were reviewed by me and considered in my medical decision making (see chart for details).     82 year old female with recent diagnosis of hypertension presents in the setting of nausea, vomiting, blood in vomit, elevated blood pressure. Patient reports today when she awoke she felt generalized fatigue and today she took her first dose of HCTZ. Later on today patient had several bouts of nonbloody diarrhea followed by nausea and nonbloody emesis. Patient noted to have elevated blood pressure home and for this came to the emergency department.  On arrival patient was hemodynamic stable but noted to have elevated blood pressure. Patient  with mild tenderness to epigastric region. Patient had 1 bout of bloody emesis while I was evaluating her. Rectal exam revealed no signs of melena. Hemoglobin within normal limits and patient found to have hyponatremia without kidney abnormality. CT scans obtained which revealed area and stomach concerning for small vessel hemorrhage. Patient was started on IV Protonix and given anti-emetics.  Lactic acidosis and elevated white blood cell count likely related to acute upper GI bleed.  Discussed case with [facility] who recommended continued IV Protonix and the patient remained nothing by mouth for endoscopy in the morning. Patient was given IV fluids and on reassessment reported improvement in nausea and had no further bouts of emesis while in the emergency department. Patient will be admitted to medicine team for further management as condition was stable at time of transfer of care.  Final Clinical Impressions(s) / ED Diagnoses   Final diagnoses:  Hematemesis with nausea  Hyponatremia  Elevated blood pressure reading  Leukocytosis, unspecified type  Lactic acid acidosis    New Prescriptions New Prescriptions   No medications on  file     Stacy Gardner, MD 08/07/16 1610    Linwood Dibbles, MD 08/09/16 1006

## 2016-08-07 NOTE — ED Notes (Signed)
Witnessed rectal exam performed by Stacy Gardner MD, no specimen collected by MD.

## 2016-08-07 NOTE — ED Notes (Addendum)
Lab called this Rn with critical Na level of 118. Dr Lonia Mad notified and to order fluids;

## 2016-08-08 ENCOUNTER — Inpatient Hospital Stay (HOSPITAL_COMMUNITY): Payer: Medicare Other | Admitting: Certified Registered Nurse Anesthetist

## 2016-08-08 ENCOUNTER — Encounter (HOSPITAL_COMMUNITY): Payer: Self-pay | Admitting: Physician Assistant

## 2016-08-08 ENCOUNTER — Encounter (HOSPITAL_COMMUNITY)
Admission: EM | Disposition: A | Discharge status: Home / Self Care | Payer: Self-pay | Source: Home / Self Care | Attending: Internal Medicine

## 2016-08-08 DIAGNOSIS — K226 Gastro-esophageal laceration-hemorrhage syndrome: Principal | ICD-10-CM

## 2016-08-08 DIAGNOSIS — K922 Gastrointestinal hemorrhage, unspecified: Secondary | ICD-10-CM

## 2016-08-08 HISTORY — PX: ESOPHAGOGASTRODUODENOSCOPY: SHX5428

## 2016-08-08 LAB — CBC
HCT: 28.4 % — ABNORMAL LOW (ref 36.0–46.0)
Hemoglobin: 10 g/dL — ABNORMAL LOW (ref 12.0–15.0)
MCH: 29 pg (ref 26.0–34.0)
MCHC: 35.2 g/dL (ref 30.0–36.0)
MCV: 82.3 fL (ref 78.0–100.0)
Platelets: 218 10*3/uL (ref 150–400)
RBC: 3.45 MIL/uL — ABNORMAL LOW (ref 3.87–5.11)
RDW: 12.7 % (ref 11.5–15.5)
WBC: 11.5 10*3/uL — ABNORMAL HIGH (ref 4.0–10.5)

## 2016-08-08 LAB — URINALYSIS, ROUTINE W REFLEX MICROSCOPIC
Bilirubin Urine: NEGATIVE
GLUCOSE, UA: 50 mg/dL — AB
HGB URINE DIPSTICK: NEGATIVE
Ketones, ur: 5 mg/dL — AB
Nitrite: NEGATIVE
PH: 8 (ref 5.0–8.0)
PROTEIN: NEGATIVE mg/dL
SPECIFIC GRAVITY, URINE: 1.024 (ref 1.005–1.030)
Squamous Epithelial / LPF: NONE SEEN

## 2016-08-08 LAB — COMPREHENSIVE METABOLIC PANEL
ALT: 15 U/L (ref 14–54)
AST: 20 U/L (ref 15–41)
Albumin: 2.8 g/dL — ABNORMAL LOW (ref 3.5–5.0)
Alkaline Phosphatase: 42 U/L (ref 38–126)
Anion gap: 8 (ref 5–15)
BUN: 14 mg/dL (ref 6–20)
CO2: 21 mmol/L — ABNORMAL LOW (ref 22–32)
Calcium: 7.8 mg/dL — ABNORMAL LOW (ref 8.9–10.3)
Chloride: 95 mmol/L — ABNORMAL LOW (ref 101–111)
Creatinine, Ser: 0.57 mg/dL (ref 0.44–1.00)
GFR calc Af Amer: >60 mL/min (ref >60)
GFR calc non Af Amer: >60 mL/min (ref >60)
Glucose, Bld: 131 mg/dL — ABNORMAL HIGH (ref 65–99)
Potassium: 3.3 mmol/L — ABNORMAL LOW (ref 3.5–5.1)
Sodium: 124 mmol/L — ABNORMAL LOW (ref 135–145)
Total Bilirubin: 0.9 mg/dL (ref 0.3–1.2)
Total Protein: 5 g/dL — ABNORMAL LOW (ref 6.5–8.1)

## 2016-08-08 LAB — SODIUM, URINE, RANDOM: SODIUM UR: 87 mmol/L

## 2016-08-08 LAB — TSH: TSH: 0.644 u[IU]/mL (ref 0.350–4.500)

## 2016-08-08 LAB — OSMOLALITY: Osmolality: 263 mOsm/kg — ABNORMAL LOW (ref 275–295)

## 2016-08-08 LAB — OSMOLALITY, URINE: OSMOLALITY UR: 327 mosm/kg (ref 300–900)

## 2016-08-08 SURGERY — EGD (ESOPHAGOGASTRODUODENOSCOPY)
Anesthesia: Monitor Anesthesia Care

## 2016-08-08 MED ORDER — SODIUM CHLORIDE 0.9 % IV SOLN
INTRAVENOUS | Status: AC
  Administered 2016-08-08 – 2016-08-09 (×2): via INTRAVENOUS

## 2016-08-08 MED ORDER — FENTANYL CITRATE (PF) 100 MCG/2ML IJ SOLN
INTRAMUSCULAR | Status: AC
  Filled 2016-08-08: qty 2

## 2016-08-08 MED ORDER — DEXTROSE 5 % IV SOLN
1.0000 g | INTRAVENOUS | Status: DC
  Administered 2016-08-08 – 2016-08-11 (×4): 1 g via INTRAVENOUS
  Filled 2016-08-08 (×4): qty 10

## 2016-08-08 MED ORDER — HYDROMORPHONE HCL 1 MG/ML IJ SOLN: 0.5000 mg | INTRAMUSCULAR | Status: DC | PRN

## 2016-08-08 MED ORDER — PROPOFOL 10 MG/ML IV BOLUS
INTRAVENOUS | Status: DC | PRN
  Administered 2016-08-08 (×3): 10 mg via INTRAVENOUS

## 2016-08-08 MED ORDER — PROPOFOL 500 MG/50ML IV EMUL
INTRAVENOUS | Status: DC | PRN
  Administered 2016-08-08: 75 ug/kg/min via INTRAVENOUS

## 2016-08-08 MED ORDER — ONDANSETRON HCL 4 MG/2ML IJ SOLN
4.0000 mg | Freq: Four times a day (QID) | INTRAMUSCULAR | Status: DC | PRN
  Administered 2016-08-08: 4 mg via INTRAVENOUS
  Filled 2016-08-08: qty 2

## 2016-08-08 MED ORDER — SACCHAROMYCES BOULARDII 250 MG PO CAPS: 250.0000 mg | ORAL_CAPSULE | Freq: Two times a day (BID) | ORAL | Status: DC

## 2016-08-08 MED ORDER — ACETAMINOPHEN 325 MG PO TABS
650.0000 mg | ORAL_TABLET | Freq: Four times a day (QID) | ORAL | Status: DC | PRN
Start: 2016-08-08 — End: 2016-08-11
  Administered 2016-08-08: 650 mg via ORAL
  Filled 2016-08-08: qty 2

## 2016-08-08 MED ORDER — EPINEPHRINE PF 1 MG/10ML IJ SOSY
PREFILLED_SYRINGE | INTRAMUSCULAR | Status: AC
  Filled 2016-08-08: qty 10

## 2016-08-08 MED ORDER — HYDROMORPHONE HCL 1 MG/ML IJ SOLN: 0.2500 mg | INTRAMUSCULAR | Status: DC | PRN

## 2016-08-08 MED ORDER — LIDOCAINE 2% (20 MG/ML) 5 ML SYRINGE
INTRAMUSCULAR | Status: DC | PRN
  Administered 2016-08-08: 40 mg via INTRAVENOUS

## 2016-08-08 MED ORDER — MIDAZOLAM HCL 5 MG/ML IJ SOLN
INTRAMUSCULAR | Status: AC
  Filled 2016-08-08: qty 2

## 2016-08-08 MED ORDER — MECLIZINE HCL 25 MG PO TABS: 25.0000 mg | ORAL_TABLET | Freq: Three times a day (TID) | ORAL | Status: DC | PRN

## 2016-08-08 MED ORDER — ONDANSETRON HCL 4 MG/2ML IJ SOLN
4.0000 mg | Freq: Once | INTRAMUSCULAR | Status: AC | PRN
  Administered 2016-08-08: 4 mg via INTRAVENOUS
  Filled 2016-08-08: qty 2

## 2016-08-08 MED ORDER — EPINEPHRINE PF 1 MG/10ML IJ SOSY
PREFILLED_SYRINGE | INTRAMUSCULAR | Status: DC | PRN
  Administered 2016-08-08: 4 mL

## 2016-08-08 MED ORDER — SODIUM CHLORIDE 0.9 % IV SOLN
INTRAVENOUS | Status: AC
  Administered 2016-08-08: 03:00:00 via INTRAVENOUS

## 2016-08-08 MED ORDER — SODIUM CHLORIDE 0.9 % IV SOLN
30.0000 meq | Freq: Once | INTRAVENOUS | Status: AC
  Administered 2016-08-08: 30 meq via INTRAVENOUS
  Filled 2016-08-08: qty 15

## 2016-08-08 MED ORDER — ONDANSETRON HCL 4 MG PO TABS: 4.0000 mg | ORAL_TABLET | Freq: Four times a day (QID) | ORAL | Status: DC | PRN

## 2016-08-08 MED ORDER — LEVOTHYROXINE SODIUM 100 MCG PO TABS
100.0000 ug | ORAL_TABLET | Freq: Every day | ORAL | Status: DC
  Administered 2016-08-08 – 2016-08-11 (×4): 100 ug via ORAL
  Filled 2016-08-08 (×4): qty 1

## 2016-08-08 NOTE — Op Note (Signed)
[facility] Patient Name: Margaret Lucas Procedure Date : 08/08/2016 MRN: [id number] Attending MD: Iva Boop , MD Date of Birth: 15-Dec-1934 CSN: [id number] Age: 82 Admit Type: Inpatient Procedure:                Upper GI endoscopy Indications:              Hematemesis Providers:                Iva Boop, MD, Jacquiline Doe, RN, Oletha Blend, Technician Referring MD:              Medicines:                Propofol per Anesthesia, Monitored Anesthesia Care Complications:            No immediate complications. Estimated Blood Loss:     Estimated blood loss: none. Procedure:                Pre-Anesthesia Assessment:                           - Prior to the procedure, a History and Physical                            was performed, and patient medications and                            allergies were reviewed. The patient's tolerance of                            previous anesthesia was also reviewed. The risks                            and benefits of the procedure and the sedation                            options and risks were discussed with the patient.                            All questions were answered, and informed consent                            was obtained. Prior Anticoagulants: The patient has                            taken no previous anticoagulant or antiplatelet                            agents. ASA Grade Assessment: III - A patient with                            severe systemic disease. After reviewing the risks  and benefits, the patient was deemed in                            satisfactory condition to undergo the procedure.                           After obtaining informed consent, the endoscope was                            passed under direct vision. Throughout the                            procedure, the patient's blood pressure, pulse, and                            oxygen  saturations were monitored continuously. The                            EG-[id number] ([id number]) scope was introduced through the                            mouth, and advanced to the second part of duodenum.                            The upper GI endoscopy was accomplished without                            difficulty. The patient tolerated the procedure                            well. Scope In: Scope Out: Findings:      A 7 mm non-bleeding Mallory-Weiss tear with stigmata of recent bleeding       - slightly raised purple spot - was found. Area was successfully       injected with 4 mL of a 1:10,000 solution of epinephrine for future       hemostasis. Estimated blood loss: none.      The exam was otherwise without abnormality.      The cardia and gastric fundus were normal on retroflexion. Impression:               - Mallory-Weiss tear. Injected. To prevent further                            bleedin                           - The examination was otherwise normal.                           - No specimens collected. Moderate Sedation:      Please see anesthesia notes, moderate sedation not given Recommendation:           - Return patient to hospital ward for ongoing care.                           -  Clear liquid diet.                           - Continue present medications.                           Hopefully home 24-48 hrs Procedure Code(s):        --- Professional ---                           [phone], Esophagogastroduodenoscopy, flexible,                            transoral; with control of bleeding, any method Diagnosis Code(s):        --- Professional ---                           K22.6, Gastro-esophageal laceration-hemorrhage                            syndrome                           K92.0, Hematemesis CPT copyright 2016 American Medical Association. All rights reserved. The codes documented in this report are preliminary and upon coder review may  be revised to meet current  compliance requirements. Iva Boop, MD 08/08/2016 3:43:17 PM This report has been signed electronically. Number of Addenda: 0

## 2016-08-08 NOTE — Anesthesia Postprocedure Evaluation (Addendum)
Anesthesia Post Note  Patient: Margaret Lucas  Procedure(s) Performed: Procedure(s) (LRB): ESOPHAGOGASTRODUODENOSCOPY (EGD) (N/A)  Patient location during evaluation: PACU Anesthesia Type: MAC Level of consciousness: awake, awake and alert and oriented Pain management: pain level controlled Vital Signs Assessment: post-procedure vital signs reviewed and stable Respiratory status: spontaneous breathing, nonlabored ventilation and respiratory function stable Cardiovascular status: blood pressure returned to baseline Anesthetic complications: no       Last Vitals:  Vitals:   08/08/16 1550 08/08/16 1725  BP: (!) 177/56 (!) 131/45  Pulse: 73 69  Resp: 18 20  Temp:  36.8 C    Last Pain:  Vitals:   08/08/16 2045  TempSrc:   PainSc: Asleep                 [name] COKER

## 2016-08-08 NOTE — Anesthesia Procedure Notes (Signed)
Procedure Name: MAC Date/Time: 08/08/2016 3:10 PM Performed by: Candis Shine Pre-anesthesia Checklist: Patient identified, Emergency Drugs available, Suction available, Patient being monitored and Timeout performed Patient Re-evaluated:Patient Re-evaluated prior to inductionOxygen Delivery Method: Nasal cannula Dental Injury: Teeth and Oropharynx as per pre-operative assessment

## 2016-08-08 NOTE — Progress Notes (Signed)
NURSING PROGRESS NOTE  Margaret Wardell BrownMRN: [id number] Admission Data: 08/08/16 at 0145 Attending Provider: Alberteen Sam, MD PCP: Thayer Headings, MD Code status: Full  Allergies:  Allergies  Allergen Reactions  . Demerol [Meperidine] Other (See Comments)    unknown    Past Medical History:  Past Medical History:  Diagnosis Date  . Diabetes mellitus without complication (HCC)   . Heart murmur   . Hypertension   . Osteoporosis   . Vertigo    Past Surgical History:  Past Surgical History:  Procedure Laterality Date  . CHOLECYSTECTOMY     Margaret Lucas is a 82 y.o. female patient, arrived to floor in room [phone] via stretcher, transferred from ED. Patient alert and oriented X 4. No acute distress noted. Denies pain.   Vital signs: Oral temperature 98 F (36.7 C), Blood pressure 125/49, Pulse 72, RR 16, SpO2 99 % on room air.  IV access: Right AC infusing with Protonix and Left Hand-saline locked; condition patent and no redness.  Skin: intact, no pressure ulcer noted in sacral area. Healed surgical scar noted on back. Verified by Gail[facility] Shelter   Patient's ID armband verified with patient and in place. Information packet given to patient. Fall risk assessed, SR up X2, patient able to verbalize understanding of risks associated with falls and to call nurse or staff to assist before getting out of bed. Patient oriented to room and equipment. Call bell within reach.

## 2016-08-08 NOTE — Progress Notes (Signed)
Pharmacy Antibiotic Note  Margaret Lucas is a 82 y.o. female admitted on 08/07/2016 with UTI.  Pharmacy has been consulted for Rocephin dosing.  Plan: Rocephin 1g IV Q24H.  Pharmacy will sign off.  Height:  (160 cm) Weight: 122 lb (55.3 kg) IBW/kg (Calculated) : 52.4  Temp (24hrs), Avg:98.5 F (36.9 C), Min:98 F (36.7 C), Max:99 F (37.2 C)   Recent Labs Lab 08/07/16 2007 08/07/16 2040 08/07/16 2329  WBC 20.8*  --   --   CREATININE 0.62  --   --   LATICACIDVEN  --  2.74* 1.24    Estimated Creatinine Clearance: 47.2 mL/min (by C-G formula based on SCr of 0.62 mg/dL).    Allergies  Allergen Reactions  . Demerol [Meperidine] Other (See Comments)    unknown     Thank you for allowing pharmacy to be a part of this patient's care.  Vernard Gambles, PharmD, BCPS  08/08/2016 3:11 AM

## 2016-08-08 NOTE — Progress Notes (Signed)
Patient ID: Margaret Lucas, female   DOB: [birth year], 82 y.o.   MRN: [id number]                                                                PROGRESS NOTE                                                                                                                                                                                                             Patient Demographics:    Margaret Lucas, is a 82 y.o. female, DOB - 1934/06/12, WUJ:[id number]  Admit date - 08/07/2016   Admitting Physician Alberteen Sam, MD  Outpatient Primary MD for the patient is Thayer Headings, MD  LOS - 1  Outpatient Specialists:    Chief Complaint  Patient presents with  . GI Bleeding  . Emesis       Brief Narrative  82 y.o. female with a past medical history significant for hypothyroidism who presents with hematemesis.  The patient was in her usual state of health until this morning, she woke up with chills and malaise and went back to bed after taking the first dose of her new HCTZ for hypertension. Around mid day, she got up and felt nauseated, and had several watery vomits, followed by emesis with bright red blood.  She had several more episodes of hematemesis and then came to the ER.  ED course: -Temp 4F, heart rate 77, pulse ox and respirations normal, blood pressure 220/80 -Na 118, K 4.4, Cr 0.62, WBC 20.8K, Hgb 12.8 -Lactic acid 2.74 -Lipase and BNP normal -Troponin negative -CT of the chest abdomen and pelvis were obtained that showed what appeared to be intraluminal blood in the stomach, no pneumonia, pancreatitis, hydronephrosis, SBO or other acute findings -She was given IV fluids and IV PPI and the case was discussed with GI who agreed to evaluate the patient in the morning and TR[facility] were asked to admit for GI bleed    She takes no blood thinners, uses no NSAIDs.  She does not use alcohol.  Denies liver disease history.  She has never had a GI bleed before.  Has had  colonoscopies in the past, doesn't know what was found.       Subjective:    Margaret Lucas today has no complains of  n/v hematemesis over nite.  Denies fever, abd pain, diarrhea, constipation, brbpr.   No headache, No chest pain, No new weakness tingling or numbness, No Cough - SOB.    Assessment  & Plan :    Principal Problem:   Upper GI bleed Active Problems:   Hyponatremia   Elevated LFTs   Hypothyroidism, unspecified   Essential hypertension   1. Upper GI bleed:  CT appears to show intraluminal blood in stomach, ?Dieulafoy lesion per report.  No known history of varices or portal hypertension.   -PPI IV gtt -NPO -IVF -GI consult, EGD this am Appreciate GI input   2. Leukocytosis: improving,  likely secondary to UTI No focal symptoms of infection.  No pneumonia.  Despite elevated lactic acid and leukocytosis, sepsis was not suspected at presentation.  Lactate cleared with fluids. - blood cultures, await urine culture Rocephin iv D#2  3. Hyponatremia:  Acute on chronic (baseline 130?).  Suspect the acute portion is from vomiting today. -check bmp in am  4. Elevated LFTs:  resolved Abdomen exam benign. -cmp in am acute hepatitis panel in am  5. Lung nodule:  4mm, small.  Patient non-smoker, no family history of lung cancer.  No follow up necessary.  6. Hypertension:  -Discontinue HCTZ given hyponatremia monitor bp today  7. Hypothyroidism:  -Continue levothyroxine -Check TSH  8. Anemia Repeat cbc in am  DVT prophylaxis:  SCD Code Status: FULL  Family Communication:  no family at bedside Disposition Plan:   Consults called: GI Admission status: INPATIENT     Lab Results  Component Value Date   PLT 218 08/08/2016    Antibiotics  :  Rocephin 4/25=>  Anti-infectives    Start     Dose/Rate Route Frequency Ordered Stop   08/08/16 0400  cefTRIAXone (ROCEPHIN) 1 g in dextrose 5 % 50 mL IVPB     1 g 100 mL/hr over 30 Minutes  Intravenous Every 24 hours 08/08/16 0312          Objective:   Vitals:   08/08/16 0045 08/08/16 0100 08/08/16 0141 08/08/16 0545  BP: 106/63 120/62 (!) 125/49 (!) 99/33  Pulse: 76 74 72 71  Resp: 20 19 16 16   Temp:   98 F (36.7 C) 98.8 F (37.1 C)  TempSrc:   Oral Oral  SpO2: 98% 98% 99% 99%  Weight:   57.1 kg (125 lb 14.4 oz)   Height:   5\' 3"  (1.6 m)     Wt Readings from Last 3 Encounters:  08/08/16 57.1 kg (125 lb 14.4 oz)  07/15/16 56.2 kg (124 lb)     Intake/Output Summary (Last 24 hours) at 08/08/16 4540 Last data filed at 08/08/16 0631  Gross per 24 hour  Intake          1534.17 ml  Output              850 ml  Net           684.17 ml     Physical Exam  Awake Alert, Oriented X 3, No new F.N deficits, Normal affect [place].AT,PERRAL Supple Neck,No JVD, No cervical lymphadenopathy appriciated.  Symmetrical Chest wall movement, Good air movement bilaterally, CTAB RRR, s1, s2, 2/6 sem rusb/ apex +ve B.Sounds, Abd Soft, No tenderness, No organomegaly appriciated, No rebound - guarding or rigidity. No Cyanosis, Clubbing or edema, No new Rash or bruise     Data Review:    CBC  Recent Labs Lab 08/07/16 2007 08/08/16 0222  WBC 20.8* 11.5*  HGB 12.8 10.0*  HCT 36.4 28.4*  PLT 276 218  MCV 82.4 82.3  MCH 29.0 29.0  MCHC 35.2 35.2  RDW 12.8 12.7    Chemistries   Recent Labs Lab 08/07/16 2007 08/08/16 0222  NA 118* 124*  K 4.4 3.3*  CL 86* 95*  CO2 21* 21*  GLUCOSE 171* 131*  BUN 10 14  CREATININE 0.62 0.57  CALCIUM 8.5* 7.8*  AST 43* 20  ALT 23 15  ALKPHOS 60 42  BILITOT 1.4* 0.9   ------------------------------------------------------------------------------------------------------------------ No results for input(s): CHOL, HDL, LDLCALC, TRIG, CHOLHDL, LDLDIRECT in the last 72 hours.  No results found for: HGBA1C ------------------------------------------------------------------------------------------------------------------  Recent  Labs  08/08/16 0359  TSH 0.644   ------------------------------------------------------------------------------------------------------------------ No results for input(s): VITAMINB12, FOLATE, FERRITIN, TIBC, IRON, RETICCTPCT in the last 72 hours.  Coagulation profile No results for input(s): INR, PROTIME in the last 168 hours.  No results for input(s): DDIMER in the last 72 hours.  Cardiac Enzymes No results for input(s): CKMB, TROPONINI, MYOGLOBIN in the last 168 hours.  Invalid input(s): CK ------------------------------------------------------------------------------------------------------------------    Component Value Date/Time   BNP 67.6 08/07/2016 2007    Inpatient Medications  Scheduled Meds: . levothyroxine  100 mcg Oral QAC breakfast  . [START ON 08/11/2016] pantoprazole  40 mg Intravenous Q12H   Continuous Infusions: . sodium chloride 100 mL/hr at 08/08/16 0304  . cefTRIAXone (ROCEPHIN)  IV Stopped (08/08/16 0516)  . pantoprozole (PROTONIX) infusion 8 mg/hr (08/08/16 0057)   PRN Meds:.meclizine, ondansetron **OR** ondansetron (ZOFRAN) IV  Micro Results No results found for this or any previous visit (from the past 240 hour(s)).  Radiology Reports Dg Chest 2 View  Result Date: 07/15/2016 CLINICAL DATA:  Cough and fatigue. EXAM: CHEST  2 VIEW COMPARISON:  09/27/2009 FINDINGS: The heart size and mediastinal contours are within normal limits. Both lungs are clear. The visualized skeletal structures are unremarkable. IMPRESSION: No active cardiopulmonary disease. Electronically Signed   By: Ellery Plunk M.D.   On: 07/15/2016 23:42   Ct Chest W Contrast  Result Date: 08/07/2016 CLINICAL DATA:  Bloody emesis EXAM: CT CHEST, ABDOMEN, AND PELVIS WITH CONTRAST TECHNIQUE: Multidetector CT imaging of the chest, abdomen and pelvis was performed following the standard protocol during bolus administration of intravenous contrast. CONTRAST:  ISOVUE-300 IOPAMIDOL  (ISOVUE-300) INJECTION 61% COMPARISON:  Chest radiograph 07/15/2016 FINDINGS: CT CHEST FINDINGS Cardiovascular: No significant vascular findings. Normal heart size. No pericardial effusion. Mediastinum/Nodes: No mediastinal, hilar or axillary lymphadenopathy. The visualized thyroid and thoracic esophageal course are unremarkable. Lungs/Pleura: Biapical scarring. No pleural effusion or pneumothorax. 4 mm nodule at the right lung base posteriorly. Musculoskeletal: Multilevel thoracic osteophytosis. No bony spinal canal stenosis. No rib fracture. CT ABDOMEN PELVIS FINDINGS Hepatobiliary: Status post cholecystectomy. Hypoattenuation of the liver compared to the spleen suggest hepatic steatosis. No biliary dilatation or focal hepatic lesion. Pancreas: Normal Spleen: Normal Adrenals/Urinary Tract: The adrenal glands are normal. There is no hydronephrosis or nephrolithiasis. No perinephric stranding or solid renal mass. Stomach/Bowel: There is a curvilinear focus of hyperdensity of equal attenuation to blood that courses from the gastroesophageal junction into the gastric lumen. This has the appearance of a artery, but it is not in the gastric wall or any solid viscus and therefore may be active extravasation (axial images 48-51, coronal image 57, sagittal image 110) the. There is a small duodenal diverticulum. The remainder the duodenum is normal. There is no small bowel obstruction or acute inflammation. There is  sigmoid diverticulosis without acute inflammation. The colon is mostly decompressed. The appendix is normal. Vascular/Lymphatic: There is mixed calcified and noncalcified plaque within the abdominal aorta. No abdominal or pelvic lymphadenopathy. Reproductive: The uterus is normal. No adnexal mass. No free fluid in the pelvis. Other: None Musculoskeletal: Multilevel lumbar osteophytosis and facet arthrosis. No bony spinal canal stenosis. IMPRESSION: 1. Serpiginous hyperdensity within the gastric lumen, which has  the appearance of a medium-sized blood vessel, but is not within the gastric wall and is thus suspicious for active hemorrhage. This could be a Dieulafoy lesion within the submucosa of the upper gastric wall, actively bleeding into the stomach. Another consideration would be a pedunculated gastric mass. Correlation with upper endoscopy should be considered. 2. No other acute abnormality of the chest, abdomen or pelvis. 3. Aortic atherosclerosis. 4. 4 mm posterior right lung base nodule. No follow-up needed if patient is low-risk. Non-contrast chest CT can be considered in 12 months if patient is high-risk. This recommendation follows the consensus statement: Guidelines for Management of Incidental Pulmonary Nodules Detected on CT Images: From the Fleischner Society 2017; Radiology 2017; 284:228-243. Critical Value/emergent results were called by telephone at the time of interpretation on 08/07/2016 at 10:20 pm to Dr. Linwood Dibbles , who verbally acknowledged these results. Electronically Signed   By: Deatra Robinson M.D.   On: 08/07/2016 22:27   Ct Abdomen Pelvis W Contrast  Result Date: 08/07/2016 CLINICAL DATA:  Bloody emesis EXAM: CT CHEST, ABDOMEN, AND PELVIS WITH CONTRAST TECHNIQUE: Multidetector CT imaging of the chest, abdomen and pelvis was performed following the standard protocol during bolus administration of intravenous contrast. CONTRAST:  ISOVUE-300 IOPAMIDOL (ISOVUE-300) INJECTION 61% COMPARISON:  Chest radiograph 07/15/2016 FINDINGS: CT CHEST FINDINGS Cardiovascular: No significant vascular findings. Normal heart size. No pericardial effusion. Mediastinum/Nodes: No mediastinal, hilar or axillary lymphadenopathy. The visualized thyroid and thoracic esophageal course are unremarkable. Lungs/Pleura: Biapical scarring. No pleural effusion or pneumothorax. 4 mm nodule at the right lung base posteriorly. Musculoskeletal: Multilevel thoracic osteophytosis. No bony spinal canal stenosis. No rib fracture.  CT ABDOMEN PELVIS FINDINGS Hepatobiliary: Status post cholecystectomy. Hypoattenuation of the liver compared to the spleen suggest hepatic steatosis. No biliary dilatation or focal hepatic lesion. Pancreas: Normal Spleen: Normal Adrenals/Urinary Tract: The adrenal glands are normal. There is no hydronephrosis or nephrolithiasis. No perinephric stranding or solid renal mass. Stomach/Bowel: There is a curvilinear focus of hyperdensity of equal attenuation to blood that courses from the gastroesophageal junction into the gastric lumen. This has the appearance of a artery, but it is not in the gastric wall or any solid viscus and therefore may be active extravasation (axial images 48-51, coronal image 57, sagittal image 110) the. There is a small duodenal diverticulum. The remainder the duodenum is normal. There is no small bowel obstruction or acute inflammation. There is sigmoid diverticulosis without acute inflammation. The colon is mostly decompressed. The appendix is normal. Vascular/Lymphatic: There is mixed calcified and noncalcified plaque within the abdominal aorta. No abdominal or pelvic lymphadenopathy. Reproductive: The uterus is normal. No adnexal mass. No free fluid in the pelvis. Other: None Musculoskeletal: Multilevel lumbar osteophytosis and facet arthrosis. No bony spinal canal stenosis. IMPRESSION: 1. Serpiginous hyperdensity within the gastric lumen, which has the appearance of a medium-sized blood vessel, but is not within the gastric wall and is thus suspicious for active hemorrhage. This could be a Dieulafoy lesion within the submucosa of the upper gastric wall, actively bleeding into the stomach. Another consideration would be a pedunculated gastric  mass. Correlation with upper endoscopy should be considered. 2. No other acute abnormality of the chest, abdomen or pelvis. 3. Aortic atherosclerosis. 4. 4 mm posterior right lung base nodule. No follow-up needed if patient is low-risk. Non-contrast  chest CT can be considered in 12 months if patient is high-risk. This recommendation follows the consensus statement: Guidelines for Management of Incidental Pulmonary Nodules Detected on CT Images: From the Fleischner Society 2017; Radiology 2017; 284:228-243. Critical Value/emergent results were called by telephone at the time of interpretation on 08/07/2016 at 10:20 pm to Dr. Linwood Dibbles , who verbally acknowledged these results. Electronically Signed   By: Deatra Robinson M.D.   On: 08/07/2016 22:27   Mm Screening Breast Tomo Bilateral  Result Date: 07/24/2016 CLINICAL DATA:  Screening. EXAM: 2D DIGITAL SCREENING BILATERAL MAMMOGRAM WITH CAD AND ADJUNCT TOMO COMPARISON:  Previous exam(s). ACR Breast Density Category b: There are scattered areas of fibroglandular density. FINDINGS: There are no findings suspicious for malignancy. Images were processed with CAD. IMPRESSION: No mammographic evidence of malignancy. A result letter of this screening mammogram will be mailed directly to the patient. RECOMMENDATION: Screening mammogram in one year. (Code:SM-B-01Y) BI-RADS CATEGORY  1: Negative. Electronically Signed   By: Sherian Rein M.D.   On: 07/24/2016 16:46    Time Spent in minutes  30   Pearson Grippe M.D on 08/08/2016 at 7:02 AM  Between 7am to 7pm - Pager - [phone]  After 7pm go to [email or web address] - password [facility]  [facility] -  Office  [phone]

## 2016-08-08 NOTE — Progress Notes (Signed)
[facility] Consult: 2:47 PM 08/08/2016  LOS: 1 day    Referring Provider: Dr Selena Batten  Primary Care Physician:  Thayer Headings, MD.  As he is currently out on medical leave Eunice Blase has been managing the patient's primary care.   Primary Gastroenterologist:  Dr. Virginia Rochester    Reason for Consultation:  hematemesis   HPI: Margaret Lucas is a 82 y.o. female.  PMH Htn.  Hypothyroidism.  Osteoporosis. NIDDM.  Vertigo. 02/2016 echo: EF 60 - 65%, grade 1 diastolic dysf, mild Ao stenosis.  S/p cholecystectomy 09/2008 screening Colonoscopy, Dr Virginia Rochester.  Occasional sigmoid tics, small int rrhoids.    ED visit 07/15/16 for dizziness, weakness in setting of diarrhea.  Hgb 13. sxs resolved with IVF and discharged home.   Pt doing well at home.  Started on HCTZ for htn, took first dose of this yesterday morning.  Did not feel well, dizzy and weak, spent the day in bed.   Onset watery, non-bloody stools in AM and emesis in afternoon.  First 2 to 3 episodes were clear, then turned to hematemesis, vomiting pure blood in moderately large amounts, some of it clotted.  None after ED arrival and anti nauseals and PPI until 1 episode just before 8 AM today.  Stools are watery but not black or bloody.  Some mid abdominal pain.    Hgb 12.8 >> 10.  Was 13 on 07/15/16.  WBCs 20 >> 11.5.  Na 124, K 3.3.   + pyuria, urine clx pending.   CT chest/ab/pelvis:  ? Blood in stomach.  Right, 4 mm, lung base nodule.   Protonix gtt, Rocephin in place.   GI ROS unremarkable other than these recent couple of events. She doesn't suffer from heartburn. She has a good appetite. Her weight is stable. She has daily, Kliewer bowel movements. There is no family history of colon cancer, peptic ulcer disease. Her brother suffers from pancreatic insufficiency but has no history  of alcohol abuse or pancreatitis as far she knows. She herself rarely if ever drinks alcohol. She does take a baby aspirin and bruises quite easily but does not have excessive bleeding. Her PMD has told her to stop taking the baby aspirin. No NSAIDs.  Past Medical History:  Diagnosis Date  . Diabetes mellitus without complication (HCC)   . Heart murmur   . Hypertension   . Osteoporosis   . Vertigo     Past Surgical History:  Procedure Laterality Date  . CHOLECYSTECTOMY    . 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   . TUBAL LIGATION      Prior to Admission medications   Medication Sig Start Date End Date Taking? Authorizing Provider  aspirin EC 81 MG tablet Take 81 mg by mouth daily.   Yes Historical Provider, MD  Calcium Carbonate-Vitamin D (CALCIUM 600+D) 600-200 MG-UNIT TABS Take 1 tablet by mouth daily.   Yes Historical Provider, MD  cholecalciferol (VITAMIN D) 1000 units tablet Take 5,000 Units by mouth daily.  Yes Historical Provider, MD  fexofenadine-pseudoephedrine (ALLEGRA-D) 60-120 MG 12 hr tablet Take 1 tablet by mouth every other day.   Yes Historical Provider, MD  fluticasone (FLONASE) 50 MCG/ACT nasal spray Place 1-2 sprays into both nostrils daily.   Yes Historical Provider, MD  levothyroxine (SYNTHROID, LEVOTHROID) 100 MCG tablet Take 100 mcg by mouth daily before breakfast.   Yes Historical Provider, MD  meclizine (ANTIVERT) 25 MG tablet Take 1 tablet (25 mg total) by mouth 3 (three) times daily as needed for dizziness. 07/16/16  Yes Maia Plan, MD  raloxifene (EVISTA) 60 MG tablet Take 60 mg by mouth daily.   Yes Historical Provider, MD    Scheduled Meds: . [MAR Hold] levothyroxine  100 mcg Oral QAC breakfast  . [MAR Hold] pantoprazole  40 mg Intravenous Q12H   Infusions: . sodium chloride 100 mL/hr at 08/08/16 0304  . [MAR Hold] cefTRIAXone (ROCEPHIN)  IV Stopped (08/08/16 0516)  . pantoprozole (PROTONIX)  infusion 8 mg/hr (08/08/16 1407)   PRN Meds: [MAR Hold] meclizine, [MAR Hold] ondansetron **OR** [MAR Hold] ondansetron (ZOFRAN) IV   Allergies as of 08/07/2016 - Review Complete 08/07/2016  Allergen Reaction Noted  . Demerol [meperidine] Other (See Comments) 07/15/2016    Family History  Problem Relation Age of Onset  . Stroke Mother   . Breast cancer Sister     Social History   Social History  . Marital status: Married    Spouse name: N/A  . Number of children: N/A  . Years of education: N/A   Occupational History  . Not on file.   Social History Main Topics  . Smoking status: Never Smoker  . Smokeless tobacco: Never Used  . Alcohol use No  . Drug use: No  . Sexual activity: Not on file   Other Topics Concern  . Not on file   Social History Narrative  . No narrative on file    REVIEW OF SYSTEMS: Constitutional:  Generally patient has good energy and strength. ENT:  No nose bleeds.  She suffers from seasonal environmental allergies which right now are active but she treats these with antihistamines and inhaled nasal steroid.  Wears hearing aids bilaterally Pulm:  Denies shortness of breath or cough. CV:  No palpitations, no LE edema.  GU:  No hematuria, no frequency.  No dysuria. GI:  Per HPI Heme:  Per HPI   Transfusions:  none Neuro:  No headaches, no peripheral tingling or numbness Derm:  No itching, no rash or sores.  Endocrine:  No sweats or chills.  No polyuria or dysuria Immunization:  Up-to-date on flu, Pneumovax etc. Travel:  None beyond local counties in last few months.    PHYSICAL EXAM: Vital signs in last 24 hours: Vitals:   08/08/16 0545 08/08/16 1419  BP: (!) 99/33 (!) 150/44  Pulse: 71 72  Resp: 16 19  Temp: 98.8 F (37.1 C)    Wt Readings from Last 3 Encounters:  08/08/16 125 lb 14.4 oz (57.1 kg)  07/15/16 124 lb (56.2 kg)   General: Pleasant, not acutely ill-appearing WF. Head:  No facial asymmetry or swelling. No signs of  head trauma.  Eyes:  No scleral icterus, no conjunctival pallor. Ears:  Although the hearing aids were not in place, her hearing was not greatly diminished.  Nose:  No congestion or discharge. Mouth:  Oropharynx moist and clear. Tongue midline. Neck:  No mass, no JVD, no thyromegaly. Lungs:  Good breath sounds. Clear bilaterally. No labored breathing, no  cough. Heart: RRR. Soft 1/6 systolic murmur. No rubs or gallops. S1, S2 present. Abdomen:  Soft. Not tender or distended. No masses, no organomegaly, no bruits, no hernias..   Rectal: Deferred   Musc/Skeltl: No joint erythema or gross deformity. Extremities:  No CCE. Feet warm and well perfused.  Neurologic:  Patient alert. Oriented x 3. No tremor or gross weakness. Skin:  No telangiectasia, no rash. No large hematoma. Tattoos:  None Nodes:  No cervical adenopathy.   Psych:  Pleasant, cooperative, calm.  Intake/Output from previous day: 04/24 0701 - 04/25 0700 In: 1534.2 [I.V.:484.2; IV Piggyback:1050] Out: 850 [Urine:850] Intake/Output this shift: Total I/O In: 0  Out: 201 [Urine:200; Stool:1]  LAB RESULTS:  Recent Labs  08/07/16 2007 08/08/16 0222  WBC 20.8* 11.5*  HGB 12.8 10.0*  HCT 36.4 28.4*  PLT 276 218   BMET Lab Results  Component Value Date   NA 124 (L) 08/08/2016   NA 118 (LL) 08/07/2016   NA 130 (L) 07/15/2016   K 3.3 (L) 08/08/2016   K 4.4 08/07/2016   K 4.3 07/15/2016   CL 95 (L) 08/08/2016   CL 86 (L) 08/07/2016   CL 97 (L) 07/15/2016   CO2 21 (L) 08/08/2016   CO2 21 (L) 08/07/2016   CO2 23 07/15/2016   GLUCOSE 131 (H) 08/08/2016   GLUCOSE 171 (H) 08/07/2016   GLUCOSE 173 (H) 07/15/2016   BUN 14 08/08/2016   BUN 10 08/07/2016   BUN 13 07/15/2016   CREATININE 0.57 08/08/2016   CREATININE 0.62 08/07/2016   CREATININE 0.77 07/15/2016   CALCIUM 7.8 (L) 08/08/2016   CALCIUM 8.5 (L) 08/07/2016   CALCIUM 9.0 07/15/2016   LFT  Recent Labs  08/07/16 2007 08/08/16 0222  PROT 6.9 5.0*    ALBUMIN 3.8 2.8*  AST 43* 20  ALT 23 15  ALKPHOS 60 42  BILITOT 1.4* 0.9   Lipase     Component Value Date/Time   LIPASE 18 08/07/2016 2007     RADIOLOGY STUDIES: Ct Chest W Contrast Ct Abdomen Pelvis W Contrast  Result Date: 08/07/2016 CLINICAL DATA:  Bloody emesis EXAM: CT CHEST, ABDOMEN, AND PELVIS WITH CONTRAST TECHNIQUE: Multidetector CT imaging of the chest, abdomen and pelvis was performed following the standard protocol during bolus administration of intravenous contrast. CONTRAST:  ISOVUE-300 IOPAMIDOL (ISOVUE-300) INJECTION 61% COMPARISON:  Chest radiograph 07/15/2016 FINDINGS: CT CHEST FINDINGS Cardiovascular: No significant vascular findings. Normal heart size. No pericardial effusion. Mediastinum/Nodes: No mediastinal, hilar or axillary lymphadenopathy. The visualized thyroid and thoracic esophageal course are unremarkable. Lungs/Pleura: Biapical scarring. No pleural effusion or pneumothorax. 4 mm nodule at the right lung base posteriorly. Musculoskeletal: Multilevel thoracic osteophytosis. No bony spinal canal stenosis. No rib fracture. CT ABDOMEN PELVIS FINDINGS Hepatobiliary: Status post cholecystectomy. Hypoattenuation of the liver compared to the spleen suggest hepatic steatosis. No biliary dilatation or focal hepatic lesion. Pancreas: Normal Spleen: Normal Adrenals/Urinary Tract: The adrenal glands are normal. There is no hydronephrosis or nephrolithiasis. No perinephric stranding or solid renal mass. Stomach/Bowel: There is a curvilinear focus of hyperdensity of equal attenuation to blood that courses from the gastroesophageal junction into the gastric lumen. This has the appearance of a artery, but it is not in the gastric wall or any solid viscus and therefore may be active extravasation (axial images 48-51, coronal image 57, sagittal image 110) the. There is a small duodenal diverticulum. The remainder the duodenum is normal. There is no small bowel obstruction or  acute inflammation. There is  sigmoid diverticulosis without acute inflammation. The colon is mostly decompressed. The appendix is normal. Vascular/Lymphatic: There is mixed calcified and noncalcified plaque within the abdominal aorta. No abdominal or pelvic lymphadenopathy. Reproductive: The uterus is normal. No adnexal mass. No free fluid in the pelvis. Other: None Musculoskeletal: Multilevel lumbar osteophytosis and facet arthrosis. No bony spinal canal stenosis. IMPRESSION: 1. Serpiginous hyperdensity within the gastric lumen, which has the appearance of a medium-sized blood vessel, but is not within the gastric wall and is thus suspicious for active hemorrhage. This could be a Dieulafoy lesion within the submucosa of the upper gastric wall, actively bleeding into the stomach. Another consideration would be a pedunculated gastric mass. Correlation with upper endoscopy should be considered. 2. No other acute abnormality of the chest, abdomen or pelvis. 3. Aortic atherosclerosis. 4. 4 mm posterior right lung base nodule. No follow-up needed if patient is low-risk. Non-contrast chest CT can be considered in 12 months if patient is high-risk. This recommendation follows the consensus statement: Guidelines for Management of Incidental Pulmonary Nodules Detected on CT Images: From the Fleischner Society 2017; Radiology 2017; 284:228-243. Critical Value/emergent results were called by telephone at the time of interpretation on 08/07/2016 at 10:20 pm to Dr. Linwood Dibbles , who verbally acknowledged these results. Electronically Signed   By: Deatra Robinson M.D.   On: 08/07/2016 22:27      IMPRESSION:   *  Hematemesis, upper GI bleed.  ? MW tear vs PUD  *  Blood loss anemia.    *  Hypokalemia, 30 meq run in progress. Hyponatremia, IV NS bolus and ongoing infusion.     *  Pyuria, Leukocytosis: UTI.  Blood and urine clx in progress. Rocephin in place.  Wonder if the initial nausea and vomiting was a result of UTI?    *  Small right lung nodule per CT, not seen on cxr 07/15/16.      PLAN:     *  EGD, hopefully this afternoon.  D/W pt and her son: Yarianna Varble [phone].     Jennye Moccasin, PA-C  08/08/2016, 2:47 PM Pager: [phone]      [facility] Attending   I have taken an interval history, reviewed the chart and examined the patient. I agree with the Advanced Practitioner's note, impression and recommendations.    Iva Boop, MD, [facility] [phone] (pager) [phone] after 5 PM, weekends and holidays  08/08/2016 2:49 PM

## 2016-08-08 NOTE — Anesthesia Preprocedure Evaluation (Signed)
Anesthesia Evaluation  Patient identified by MRN, date of birth, ID band Patient awake    Reviewed: Allergy & Precautions, NPO status , Patient's Chart, lab work & pertinent test results  Airway Mallampati: II  TM Distance: >3 FB Neck ROM: Full    Dental  (+) Partial Lower   Pulmonary    breath sounds clear to auscultation       Cardiovascular hypertension,  Rhythm:Regular Rate:Normal     Neuro/Psych    GI/Hepatic   Endo/Other  diabetes  Renal/GU      Musculoskeletal   Abdominal   Peds  Hematology   Anesthesia Other Findings   Reproductive/Obstetrics                             Anesthesia Physical Anesthesia Plan  ASA: III  Anesthesia Plan: MAC   Post-op Pain Management:    Induction: Intravenous  Airway Management Planned: Natural Airway and Nasal Cannula  Additional Equipment:   Intra-op Plan:   Post-operative Plan:   Informed Consent: I have reviewed the patients History and Physical, chart, labs and discussed the procedure including the risks, benefits and alternatives for the proposed anesthesia with the patient or authorized representative who has indicated his/her understanding and acceptance.     Plan Discussed with: CRNA and Anesthesiologist  Anesthesia Plan Comments:         Anesthesia Quick Evaluation

## 2016-08-08 NOTE — Progress Notes (Signed)
Received report from Candace, RN

## 2016-08-08 NOTE — Progress Notes (Addendum)
Pt's BP is 131/45. MD made aware. Will continue to monitor.

## 2016-08-08 NOTE — H&P (Signed)
History and Physical  Patient Name: Margaret Lucas     ZOX:[id number]    DOB: 1934-08-13    DOA: 08/07/2016 PCP: Thayer Headings, MD   Patient coming from: Home  Chief Complaint: Hematemesis  HPI: Margaret Lucas is a 82 y.o. female with a past medical history significant for hypothyroidism who presents with hematemesis.  The patient was in her usual state of health until this morning, she woke up with chills and malaise and went back to bed after taking the first dose of her new HCTZ for hypertension. Around mid day, she got up and felt nauseated, and had several watery vomits, followed by emesis with bright red blood.  She had several more episodes of hematemesis and then came to the ER.  ED course: -Temp 18F, heart rate 77, pulse ox and respirations normal, blood pressure 220/80 -Na 118, K 4.4, Cr 0.62, WBC 20.8K, Hgb 12.8 -Lactic acid 2.74 -Lipase and BNP normal -Troponin negative -CT of the chest abdomen and pelvis were obtained that showed what appeared to be intraluminal blood in the stomach, no pneumonia, pancreatitis, hydronephrosis, SBO or other acute findings -She was given IV fluids and IV PPI and the case was discussed with GI who agreed to evaluate the patient in the morning and TRH were asked to admit for GI bleed    She takes no blood thinners, uses no NSAIDs.  She does not use alcohol.  Denies liver disease history.  She has never had a GI bleed before.  Has had colonoscopies in the past, doesn't know what was found.      ROS: Review of Systems  Constitutional: Positive for chills, fever and malaise/fatigue.  Respiratory: Negative for cough and sputum production.   Gastrointestinal: Positive for diarrhea (one loose BM today), nausea and vomiting. Negative for abdominal pain, blood in stool and melena (uncertain).  Genitourinary: Negative for dysuria, flank pain, frequency, hematuria and urgency.  Psychiatric/Behavioral:       Mild confusion today  All other  systems reviewed and are negative.         Past Medical History:  Diagnosis Date  . Diabetes mellitus without complication (HCC)   . Heart murmur   . Hypertension   . Osteoporosis   . Vertigo     Past Surgical History:  Procedure Laterality Date  . CHOLECYSTECTOMY      Social History: Patient lives with her son.  The patient walks unassisted.  Still drives.  From [place] area.  Was a 5th grade teacher.  Did not smoke.    Allergies  Allergen Reactions  . Demerol [Meperidine] Other (See Comments)    unknown    Family history: family history includes Breast cancer in her sister; Stroke in her mother.  Prior to Admission medications   Medication Sig Start Date End Date Taking? Authorizing Provider  aspirin EC 81 MG tablet Take 81 mg by mouth daily.   Yes Historical Provider, MD  Calcium Carbonate-Vitamin D (CALCIUM 600+D) 600-200 MG-UNIT TABS Take 1 tablet by mouth daily.   Yes Historical Provider, MD  cholecalciferol (VITAMIN D) 1000 units tablet Take 5,000 Units by mouth daily.   Yes Historical Provider, MD  fexofenadine-pseudoephedrine (ALLEGRA-D) 60-120 MG 12 hr tablet Take 1 tablet by mouth every other day.   Yes Historical Provider, MD  fluticasone (FLONASE) 50 MCG/ACT nasal spray Place 1-2 sprays into both nostrils daily.   Yes Historical Provider, MD  levothyroxine (SYNTHROID, LEVOTHROID) 100 MCG tablet Take 100 mcg by mouth daily before  breakfast.   Yes Historical Provider, MD  meclizine (ANTIVERT) 25 MG tablet Take 1 tablet (25 mg total) by mouth 3 (three) times daily as needed for dizziness. 07/16/16  Yes Maia Plan, MD  raloxifene (EVISTA) 60 MG tablet Take 60 mg by mouth daily.   Yes Historical Provider, MD       Physical Exam: BP (!) 125/49 (BP Location: Left Arm)   Pulse 72   Temp 98 F (36.7 C) (Oral)   Resp 16   Ht  (1.6 m)   Wt 55.3 kg (122 lb)   SpO2 99%   BMI 21.61 kg/m  General appearance: Well-developed, elderly adult female, alert  and in mild distress from malaise.   Eyes: Anicteric, conjunctiva pink, lids and lashes normal. PERRL.    ENT: No nasal deformity, discharge, epistaxis.  Hearing diminished. OP tacky dry without lesions.   Neck: No neck masses.  Trachea midline.  No thyromegaly/tenderness. Lymph: No cervical or supraclavicular lymphadenopathy. Skin: Warm and dry.  No jaundice.  No suspicious rashes or lesions. Cardiac: RRR, nl S1-S2, soft SEM.  Capillary refill is brisk.  JVP normal.  No LE edema.  Radial and DP pulses 2+ and symmetric. Respiratory: Normal respiratory rate and rhythm.  CTAB without rales or wheezes. Abdomen: Abdomen soft.  No TTP. No ascites, distension, hepatosplenomegaly.   MSK: No deformities or effusions.  No cyanosis or clubbing. Neuro: Cranial nerves normal.  Sensation intact to light touch. Speech is fluent.  Muscle strength globally weak.    Psych: Sensorium intact and responding to questions, attention diminished, seems out of it.  Behavior appropriate.  Affect blunted.  Judgment and insight appear poor.     Labs on Admission:  I have personally reviewed following labs and imaging studies: CBC:  Recent Labs Lab 08/07/16 2007  WBC 20.8*  HGB 12.8  HCT 36.4  MCV 82.4  PLT 276   Basic Metabolic Panel:  Recent Labs Lab 08/07/16 2007  NA 118*  K 4.4  CL 86*  CO2 21*  GLUCOSE 171*  BUN 10  CREATININE 0.62  CALCIUM 8.5*   GFR: Estimated Creatinine Clearance: 47.2 mL/min (by C-G formula based on SCr of 0.62 mg/dL).  Liver Function Tests:  Recent Labs Lab 08/07/16 2007  AST 43*  ALT 23  ALKPHOS 60  BILITOT 1.4*  PROT 6.9  ALBUMIN 3.8    Recent Labs Lab 08/07/16 2007  LIPASE 18   No results for input(s): AMMONIA in the last 168 hours. Coagulation Profile: No results for input(s): INR, PROTIME in the last 168 hours. Cardiac Enzymes: No results for input(s): CKTOTAL, CKMB, CKMBINDEX, TROPONINI in the last 168 hours. BNP (last 3 results) No results for  input(s): PROBNP in the last 8760 hours. HbA1C: No results for input(s): HGBA1C in the last 72 hours. CBG: No results for input(s): GLUCAP in the last 168 hours. Lipid Profile: No results for input(s): CHOL, HDL, LDLCALC, TRIG, CHOLHDL, LDLDIRECT in the last 72 hours. Thyroid Function Tests: No results for input(s): TSH, T4TOTAL, FREET4, T3FREE, THYROIDAB in the last 72 hours. Anemia Panel: No results for input(s): VITAMINB12, FOLATE, FERRITIN, TIBC, IRON, RETICCTPCT in the last 72 hours. Sepsis Labs: Lactic acid 2.74 Invalid input(s): PROCALCITONIN, LACTICIDVEN No results found for this or any previous visit (from the past 240 hour(s)).       Radiological Exams on Admission: Personally reviewed: Ct Chest W Contrast  Result Date: 08/07/2016 CLINICAL DATA:  Bloody emesis EXAM: CT CHEST, ABDOMEN, AND PELVIS  WITH CONTRAST TECHNIQUE: Multidetector CT imaging of the chest, abdomen and pelvis was performed following the standard protocol during bolus administration of intravenous contrast. CONTRAST:  ISOVUE-300 IOPAMIDOL (ISOVUE-300) INJECTION 61% COMPARISON:  Chest radiograph 07/15/2016 FINDINGS: CT CHEST FINDINGS Cardiovascular: No significant vascular findings. Normal heart size. No pericardial effusion. Mediastinum/Nodes: No mediastinal, hilar or axillary lymphadenopathy. The visualized thyroid and thoracic esophageal course are unremarkable. Lungs/Pleura: Biapical scarring. No pleural effusion or pneumothorax. 4 mm nodule at the right lung base posteriorly. Musculoskeletal: Multilevel thoracic osteophytosis. No bony spinal canal stenosis. No rib fracture. CT ABDOMEN PELVIS FINDINGS Hepatobiliary: Status post cholecystectomy. Hypoattenuation of the liver compared to the spleen suggest hepatic steatosis. No biliary dilatation or focal hepatic lesion. Pancreas: Normal Spleen: Normal Adrenals/Urinary Tract: The adrenal glands are normal. There is no hydronephrosis or nephrolithiasis. No  perinephric stranding or solid renal mass. Stomach/Bowel: There is a curvilinear focus of hyperdensity of equal attenuation to blood that courses from the gastroesophageal junction into the gastric lumen. This has the appearance of a artery, but it is not in the gastric wall or any solid viscus and therefore may be active extravasation (axial images 48-51, coronal image 57, sagittal image 110) the. There is a small duodenal diverticulum. The remainder the duodenum is normal. There is no small bowel obstruction or acute inflammation. There is sigmoid diverticulosis without acute inflammation. The colon is mostly decompressed. The appendix is normal. Vascular/Lymphatic: There is mixed calcified and noncalcified plaque within the abdominal aorta. No abdominal or pelvic lymphadenopathy. Reproductive: The uterus is normal. No adnexal mass. No free fluid in the pelvis. Other: None Musculoskeletal: Multilevel lumbar osteophytosis and facet arthrosis. No bony spinal canal stenosis. IMPRESSION: 1. Serpiginous hyperdensity within the gastric lumen, which has the appearance of a medium-sized blood vessel, but is not within the gastric wall and is thus suspicious for active hemorrhage. This could be a Dieulafoy lesion within the submucosa of the upper gastric wall, actively bleeding into the stomach. Another consideration would be a pedunculated gastric mass. Correlation with upper endoscopy should be considered. 2. No other acute abnormality of the chest, abdomen or pelvis. 3. Aortic atherosclerosis. 4. 4 mm posterior right lung base nodule. No follow-up needed if patient is low-risk. Non-contrast chest CT can be considered in 12 months if patient is high-risk. This recommendation follows the consensus statement: Guidelines for Management of Incidental Pulmonary Nodules Detected on CT Images: From the Fleischner Society 2017; Radiology 2017; 284:228-243. Critical Value/emergent results were called by telephone at the time of  interpretation on 08/07/2016 at 10:20 pm to Dr. Linwood Dibbles , who verbally acknowledged these results. Electronically Signed   By: Deatra Robinson M.D.   On: 08/07/2016 22:27   Ct Abdomen Pelvis W Contrast  Result Date: 08/07/2016 CLINICAL DATA:  Bloody emesis EXAM: CT CHEST, ABDOMEN, AND PELVIS WITH CONTRAST TECHNIQUE: Multidetector CT imaging of the chest, abdomen and pelvis was performed following the standard protocol during bolus administration of intravenous contrast. CONTRAST:  ISOVUE-300 IOPAMIDOL (ISOVUE-300) INJECTION 61% COMPARISON:  Chest radiograph 07/15/2016 FINDINGS: CT CHEST FINDINGS Cardiovascular: No significant vascular findings. Normal heart size. No pericardial effusion. Mediastinum/Nodes: No mediastinal, hilar or axillary lymphadenopathy. The visualized thyroid and thoracic esophageal course are unremarkable. Lungs/Pleura: Biapical scarring. No pleural effusion or pneumothorax. 4 mm nodule at the right lung base posteriorly. Musculoskeletal: Multilevel thoracic osteophytosis. No bony spinal canal stenosis. No rib fracture. CT ABDOMEN PELVIS FINDINGS Hepatobiliary: Status post cholecystectomy. Hypoattenuation of the liver compared to the spleen suggest hepatic steatosis.  No biliary dilatation or focal hepatic lesion. Pancreas: Normal Spleen: Normal Adrenals/Urinary Tract: The adrenal glands are normal. There is no hydronephrosis or nephrolithiasis. No perinephric stranding or solid renal mass. Stomach/Bowel: There is a curvilinear focus of hyperdensity of equal attenuation to blood that courses from the gastroesophageal junction into the gastric lumen. This has the appearance of a artery, but it is not in the gastric wall or any solid viscus and therefore may be active extravasation (axial images 48-51, coronal image 57, sagittal image 110) the. There is a small duodenal diverticulum. The remainder the duodenum is normal. There is no small bowel obstruction or acute inflammation. There is  sigmoid diverticulosis without acute inflammation. The colon is mostly decompressed. The appendix is normal. Vascular/Lymphatic: There is mixed calcified and noncalcified plaque within the abdominal aorta. No abdominal or pelvic lymphadenopathy. Reproductive: The uterus is normal. No adnexal mass. No free fluid in the pelvis. Other: None Musculoskeletal: Multilevel lumbar osteophytosis and facet arthrosis. No bony spinal canal stenosis. IMPRESSION: 1. Serpiginous hyperdensity within the gastric lumen, which has the appearance of a medium-sized blood vessel, but is not within the gastric wall and is thus suspicious for active hemorrhage. This could be a Dieulafoy lesion within the submucosa of the upper gastric wall, actively bleeding into the stomach. Another consideration would be a pedunculated gastric mass. Correlation with upper endoscopy should be considered. 2. No other acute abnormality of the chest, abdomen or pelvis. 3. Aortic atherosclerosis. 4. 4 mm posterior right lung base nodule. No follow-up needed if patient is low-risk. Non-contrast chest CT can be considered in 12 months if patient is high-risk. This recommendation follows the consensus statement: Guidelines for Management of Incidental Pulmonary Nodules Detected on CT Images: From the Fleischner Society 2017; Radiology 2017; 284:228-243. Critical Value/emergent results were called by telephone at the time of interpretation on 08/07/2016 at 10:20 pm to Dr. Linwood Dibbles , who verbally acknowledged these results. Electronically Signed   By: Deatra Robinson M.D.   On: 08/07/2016 22:27    EKG: Independently reviewed. Rate 82, old RBBB, QTc 485.    Echocardiogram Nov 2017: Report reviewed EF 60-65% Mild AS Grade I DD          Assessment/Plan  1. Upper GI bleed:  CT appears to show intraluminal blood in stomach, ?Dieulafoy lesion per report.  No known history of varices or portal hypertension.   -PPI IV gtt -NPO -IVF -GI consult,  appreciate cares   2. Leukocytosis:  No focal symptoms of infection.  No pneumonia.  Despite elevated lactic acid and leukocytosis, sepsis was not suspected at presentation.  Lactate cleared with fluids. -Obtain blood cultures -Check urinalysis  3. Hyponatremia:  Acute on chronic (baseline 130?).  Suspect the acute portion is from vomiting today. -Check osmolalities and urine sodium -Fluids and repeat Sodium overnight  4. Elevated LFTs:  Unclear cause. Abdomen exam benign. -Trend CMP  5. Lung nodule:  4mm, small.  Patient non-smoker, no family history of lung cancer.  No follow up necessary.  6. Hypertension:  -Discontinue HCTZ given hyponatremia   7. Hypothyroidism:  -Continue levothyroxine -Check TSH     DVT prophylaxis: Lovenox  Code Status: FULL  Family Communication: Children at bedside  Disposition Plan: Anticipate IV fluids, IV PPI, consult to GI.  Follow fever curve and obtain blood cultures and UA. Consults called: GI Admission status: INPATIENT   Medical decision making: Patient seen at 11:40 PM on 08/07/2016.  The patient was discussed with Dr. Lonia Mad.  What exists of the patient's chart was reviewed in depth and summarized above.  Clinical condition: stable.        Alberteen Sam [facility] Pager [phone]      At the time of admission, it appears that the appropriate admission status for this patient is INPATIENT. This is judged to be reasonable and necessary in order to provide the required intensity of service to ensure the patient's safety given the presenting symptoms, physical exam findings, and initial radiographic and laboratory data in the context of their chronic comorbidities.  Together, these circumstances are felt to place her at high risk for further clinical deterioration threatening life, limb, or organ.   Patient requires inpatient status due to high intensity of service, high risk for further deterioration and high  frequency of surveillance required because of this acute illness that poses a threat to life, limb or bodily function.  I certify that at the point of admission it is my clinical judgment that the patient will require inpatient hospital care spanning beyond 2 midnights from the point of admission and that early discharge would result in unnecessary risk of decompensation and readmission or threat to life, limb or bodily function.

## 2016-08-08 NOTE — Progress Notes (Signed)
RN notified Danford,MD that orders for blood cultures to be drawn were not showing up in [facility] for the phlebotmist to draw blood cultures. Danford,MD returned page and told this RN to reorder the blood cultures correctly so that phlebotomy can draw the blood cultures. Order for blood cultures were placed by this RN with verbal readback from Danford,MD. MD stated only hang the ordered Rocephin after blood cultures had been drawn. Blood cultures were drawn and collected by phlebotomy at 0415. IV antibiotics will be hung by this RN at this time.

## 2016-08-08 NOTE — Transfer of Care (Signed)
Immediate Anesthesia Transfer of Care Note  Patient: Margaret Lucas  Procedure(s) Performed: Procedure(s): ESOPHAGOGASTRODUODENOSCOPY (EGD) (N/A)  Patient Location: Endoscopy Unit  Anesthesia Type:MAC  Level of Consciousness: awake, alert  and oriented  Airway & Oxygen Therapy: Patient Spontanous Breathing and Patient connected to nasal cannula oxygen  Post-op Assessment: Report given to RN and Post -op Vital signs reviewed and stable  Post vital signs: Reviewed and stable  Last Vitals:  Vitals:   08/08/16 0545 08/08/16 1419  BP: (!) 99/33 (!) 150/44  Pulse: 71 72  Resp: 16 19  Temp: 37.1 C     Last Pain:  Vitals:   08/08/16 1419  TempSrc: Oral  PainSc:          Complications: No apparent anesthesia complications

## 2016-08-09 ENCOUNTER — Encounter (HOSPITAL_COMMUNITY): Payer: Self-pay | Admitting: Internal Medicine

## 2016-08-09 DIAGNOSIS — N390 Urinary tract infection, site not specified: Secondary | ICD-10-CM

## 2016-08-09 DIAGNOSIS — D62 Acute posthemorrhagic anemia: Secondary | ICD-10-CM

## 2016-08-09 LAB — URINE CULTURE: Culture: <10000 — AB

## 2016-08-09 LAB — CBC
HCT: 23.6 % — ABNORMAL LOW (ref 36.0–46.0)
Hemoglobin: 8 g/dL — ABNORMAL LOW (ref 12.0–15.0)
MCH: 28.4 pg (ref 26.0–34.0)
MCHC: 33.9 g/dL (ref 30.0–36.0)
MCV: 83.7 fL (ref 78.0–100.0)
Platelets: 202 10*3/uL (ref 150–400)
RBC: 2.82 MIL/uL — AB (ref 3.87–5.11)
RDW: 13.2 % (ref 11.5–15.5)
WBC: 6.3 10*3/uL (ref 4.0–10.5)

## 2016-08-09 LAB — COMPREHENSIVE METABOLIC PANEL
ALT: 13 U/L — ABNORMAL LOW (ref 14–54)
AST: 17 U/L (ref 15–41)
Albumin: 2.5 g/dL — ABNORMAL LOW (ref 3.5–5.0)
Alkaline Phosphatase: 33 U/L — ABNORMAL LOW (ref 38–126)
Anion gap: 4 — ABNORMAL LOW (ref 5–15)
BILIRUBIN TOTAL: 0.4 mg/dL (ref 0.3–1.2)
BUN: 11 mg/dL (ref 6–20)
CO2: 25 mmol/L (ref 22–32)
CREATININE: 0.84 mg/dL (ref 0.44–1.00)
Calcium: 8.2 mg/dL — ABNORMAL LOW (ref 8.9–10.3)
Chloride: 108 mmol/L (ref 101–111)
Glucose, Bld: 98 mg/dL (ref 65–99)
Potassium: 3.9 mmol/L (ref 3.5–5.1)
Sodium: 137 mmol/L (ref 135–145)
Total Protein: 4.6 g/dL — ABNORMAL LOW (ref 6.5–8.1)

## 2016-08-09 LAB — HEMOGLOBIN AND HEMATOCRIT, BLOOD
HEMATOCRIT: 26.6 % — AB (ref 36.0–46.0)
HEMOGLOBIN: 9 g/dL — AB (ref 12.0–15.0)

## 2016-08-09 LAB — CORTISOL: Cortisol, Plasma: 11.2 ug/dL

## 2016-08-09 MED ORDER — SODIUM CHLORIDE 0.9 % IV SOLN
8.0000 mg/h | INTRAVENOUS | Status: AC
  Administered 2016-08-09 – 2016-08-10 (×2): 8 mg/h via INTRAVENOUS
  Filled 2016-08-09 (×4): qty 80

## 2016-08-09 MED ORDER — PRO-STAT SUGAR FREE PO LIQD
30.0000 mL | Freq: Two times a day (BID) | ORAL | Status: DC
  Administered 2016-08-09 – 2016-08-11 (×5): 30 mL via ORAL
  Filled 2016-08-09 (×5): qty 30

## 2016-08-09 NOTE — Progress Notes (Addendum)
          Daily Rounding Note  08/09/2016, 11:03 AM  LOS: 2 days  ASSESMENT:   *  Hematemesis.   EGD 4/25: MW tear.    72 hour PPI drip finishes 4/27 at 2300.    *  Blood loss anemia.  Hgb 12.8 >> 10 >> 8.0.    *  UTI.  This was likely cause of her N/V.  However urine clx: < 10 K colonies (insignificant growth)   *  Small right lung nodule per CT.    *  Resolved hypokalemia and hyponatremia.     PLAN   *  Needs to stay at least another 24 hours.  Hgb/crit this afternoon and in AM.      Margaret Lucas  08/09/2016, 11:03 AM Pager: [phone]    [facility] Attending   I have taken an interval history, reviewed the chart and examined the patient. I agree with the Advanced Practitioner's note, impression and recommendations.   Improved and stable last Hgb 9 Still having melena - not surprising  Would plan for outpt f/u primary care Daily PPI x 2 months then stop F/u Hgb and supplement ferrous sulfate as outpatient  Signing off - call if ?  Iva Boop, MD, [facility] [phone] (pager) [phone] after 5 PM, weekends and holidays  08/09/2016 4:32 PM  SUBJECTIVE:     Dark, loose stool last night 6 PM.  Nothing since.  No nausea.  Some abdominal discomfort currently but needs to urinate and, perhaps, have A BM.    OBJECTIVE:         Vital signs in last 24 hours:    Temp:  [98 F (36.7 C)-98.5 F (36.9 C)] 98 F (36.7 C) (04/26 0538) Pulse Rate:  [60-92] 64 (04/26 0538) Resp:  [16-22] 16 (04/26 0538) BP: (103-200)/(40-64) 103/42 (04/26 0538) SpO2:  [94 %-100 %] 98 % (04/26 0538) Last BM Date: 08/08/16 Filed Weights   08/07/16 2001 08/08/16 0141  Weight: 55.3 kg (122 lb) 57.1 kg (125 lb 14.4 oz)   General: pleasant, NAD.  Looks a bit frail but not ill   Heart: RRR Chest: clear bil.  No dyspnea Abdomen: soft, NT, ND.  Active BS  Extremities: no CCE Neuro/Psych:  Alert.  Oriented x  3.  Moves all 4s, no tremor.   Intake/Output from previous day: 04/25 0701 - 04/26 0700 In: 1613.8 [I.V.:1563.8; IV Piggyback:50] Out: 751 [Urine:750; Stool:1]  Intake/Output this shift: Total I/O In: 620 [P.O.:620] Out: -   Lab Results:  Recent Labs  08/07/16 2007 08/08/16 0222 08/09/16 0613  WBC 20.8* 11.5* 6.3  HGB 12.8 10.0* 8.0*  HCT 36.4 28.4* 23.6*  PLT 276 218 202   BMET  Recent Labs  08/07/16 2007 08/08/16 0222 08/09/16 0613  NA 118* 124* 137  K 4.4 3.3* 3.9  CL 86* 95* 108  CO2 21* 21* 25  GLUCOSE 171* 131* 98  BUN CREATININE 0.62 0.57 0.84  CALCIUM 8.5* 7.8* 8.2*   LFT  Recent Labs  08/07/16 2007 08/08/16 0222 08/09/16 0613  PROT 6.9 5.0* 4.6*  ALBUMIN 3.8 2.8* 2.5*  AST 43* 20 17  ALT 23 15 13*  ALKPHOS 60 42 33*  BILITOT 1.4* 0.9 0.4

## 2016-08-09 NOTE — Progress Notes (Signed)
Patient ID: Margaret Lucas, female   DOB: 26-Oct-1934, 83 y.o.   MRN: [id number]                                                                PROGRESS NOTE                                                                                                                                                                                                             Patient Demographics:    Margaret Lucas, is a 82 y.o. female, DOB - 1935-01-22, NWG:[id number]  Admit date - 08/07/2016   Admitting Physician Alberteen Sam, MD  Outpatient Primary MD for the patient is Thayer Headings, MD  L[facility] - 2  [facility]ists    Chief Complaint  Patient presents with  . GI Bleeding  . Emesis       Brief Narrative  82 y.o.femalewith a past medical history significant for hypothyroidismwho presents with hematemesis.  The patient was in her usual state of health until this morning, she woke up with chills and malaise and went back to bed after taking the first dose of her new HCTZ for hypertension. Around mid day, she got up and felt nauseated, and had several watery vomits, followed by emesis with bright red blood. She had several more episodes of hematemesis and then came to the ER.  ED course: -Temp 72F, heart rate 77, pulse ox and respirations normal, blood pressure 220/80 -Na 118, K 4.4, Cr 0.62, WBC 20.8K, Hgb 12.8 -Lactic acid 2.74 -Lipase and BNP normal -Troponin negative -CT of the chest abdomen and pelvis were obtained that showed what appeared to be intraluminal blood in the stomach, no pneumonia, pancreatitis, hydronephrosis, SBO or other acute findings -She was given IV fluids and IV PPI and the case was discussed with GI who agreed to evaluate the patient in the morning and TR[facility] were asked to admit for GI bleed    She takes no blood thinners, uses no NSAIDs. She does not use alcohol. Denies liver disease history. She has never had a GI bleed before. Has had  colonoscopies in the past, doesn't know what was found.      Subjective:    Margaret Lucas today still has slight black stool per pt.  Hgb, slightly less. Denies abd  pain, diarrhea. Constipation, brbpr.   , No headache, No chest pain, No new weakness tingling or numbness, No Cough - SOB.   Assessment  & Plan :    Principal Problem:   Upper GI bleed Active Problems:   Hyponatremia   Elevated LFTs   Hypothyroidism, unspecified   Essential hypertension   Mallory-Weiss syndrome  1. Upper GI bleed: CT appears to show intraluminal blood in stomach, ?Dieulafoy lesion per report. No known history of varices or portal hypertension. -PPI IV gtt -advance diet as tolerated EGD 4/25 Appreciate GI input Will hold another day due to black stool and drop in Hgb. Cbc in am     2. Leukocytosis: improving, likely secondary to UTI No focal symptoms of infection. No pneumonia. Despite elevated lactic acid and leukocytosis, sepsis was not suspected at presentation. Lactate cleared with fluids. - blood cultures ngtd, await urine culture <10,000CFU Rocephin iv D#3  3. Hyponatremia:improving Acute on chronic (baseline 130?). Suspect the acute portion is from vomiting today. -check bmp in am  4. Elevated LFTs: resolved Abdomen exam benign. -cmp iam  5. Lung nodule: 4mm, small. Patient non-smoker, no family history of lung cancer. No follow up necessary.  6. Hypertension: -Discontinue HCTZ given hyponatremia monitor bp today  7. Hypothyroidism: -Continue levothyroxine -Check TSH  8. Anemia Repeat cbc in am  DVT prophylaxis: SCD Code Status:FULL Family Communication: no family at bedside Disposition Plan:  Consults called:GI Admission status:INPATIENT   Lab Results  Component Value Date   PLT 202 08/09/2016     Anti-infectives    Start     Dose/Rate Route Frequency Ordered Stop   08/08/16 0400  cefTRIAXone (ROCEPHIN) 1 g in dextrose 5 %  50 mL IVPB     1 g 100 mL/hr over 30 Minutes Intravenous Every 24 hours 08/08/16 0312          Objective:   Vitals:   08/08/16 1550 08/08/16 1725 08/08/16 2129 08/09/16 0538  BP: (!) 177/56 (!) 131/45 (!) 118/40 (!) 103/42  Pulse: 73 69 60 64  Resp: Temp:  98.2 F (36.8 C) 98.3 F (36.8 C) 98 F (36.7 C)  TempSrc:  Oral Oral Oral  SpO2: 100% 99% 97% 98%  Weight:      Height:        Wt Readings from Last 3 Encounters:  08/08/16 57.1 kg (125 lb 14.4 oz)  07/15/16 56.2 kg (124 lb)     Intake/Output Summary (Last 24 hours) at 08/09/16 1319 Last data filed at 08/09/16 1012  Gross per 24 hour  Intake          2233.75 ml  Output              550 ml  Net          1683.75 ml     Physical Exam  Awake Alert, Oriented X 3, No new F.N deficits, Normal affect [place].AT,PERRAL Supple Neck,No JVD, No cervical lymphadenopathy appriciated.  Symmetrical Chest wall movement, Good air movement bilaterally, CTAB RRR,No Gallops,Rubs or new Murmurs, No Parasternal Heave +ve B.Sounds, Abd Soft, No tenderness, No organomegaly appriciated, No rebound - guarding or rigidity. No Cyanosis, Clubbing or edema, No new Rash or bruise      Data Review:    CBC  Recent Labs Lab 08/07/16 2007 08/08/16 0222 08/09/16 0613  WBC 20.8* 11.5* 6.3  HGB 12.8 10.0* 8.0*  HCT 36.4 28.4* 23.6*  PLT 276 218 202  MCV 82.4 82.3  83.7  MCH 29.0 29.0 28.4  MCHC 35.2 35.2 33.9  RDW 12.8 12.7 13.2    Chemistries   Recent Labs Lab 08/07/16 2007 08/08/16 0222 08/09/16 0613  NA 118* 124* 137  K 4.4 3.3* 3.9  CL 86* 95* 108  CO2 21* 21* 25  GLUCOSE 171* 131* 98  BUN 10 14 11   CREATININE 0.62 0.57 0.84  CALCIUM 8.5* 7.8* 8.2*  AST 43* 20 17  ALT 23 15 13*  ALKPHOS 60 42 33*  BILITOT 1.4* 0.9 0.4   ------------------------------------------------------------------------------------------------------------------ No results for input(s): CHOL, HDL, LDLCALC, TRIG, CHOLHDL, LDLDIRECT  in the last 72 hours.  No results found for: HGBA1C ------------------------------------------------------------------------------------------------------------------  Recent Labs  08/08/16 0359  TSH 0.644   ------------------------------------------------------------------------------------------------------------------ No results for input(s): VITAMINB12, FOLATE, FERRITIN, TIBC, IRON, RETICCTPCT in the last 72 hours.  Coagulation profile No results for input(s): INR, PROTIME in the last 168 hours.  No results for input(s): DDIMER in the last 72 hours.  Cardiac Enzymes No results for input(s): CKMB, TROPONINI, MYOGLOBIN in the last 168 hours.  Invalid input(s): CK ------------------------------------------------------------------------------------------------------------------    Component Value Date/Time   BNP 67.6 08/07/2016 2007    Inpatient Medications  Scheduled Meds: . levothyroxine  100 mcg Oral QAC breakfast  . [START ON 08/11/2016] pantoprazole  40 mg Intravenous Q12H   Continuous Infusions: . cefTRIAXone (ROCEPHIN)  IV Stopped (08/09/16 0339)  . pantoprozole (PROTONIX) infusion 8 mg/hr (08/09/16 0307)   PRN Meds:.acetaminophen, HYDROmorphone (DILAUDID) injection, meclizine, ondansetron **OR** ondansetron (ZOFRAN) IV  Micro Results Recent Results (from the past 240 hour(s))  Culture, Urine     Status: Abnormal   Collection Time: 08/08/16  3:30 AM  Result Value Ref Range Status   Specimen Description URINE, RANDOM  Final   Special Requests NONE  Final   Culture <10,000 COLONIES/mL INSIGNIFICANT GROWTH (A)  Final   Report Status 08/09/2016 FINAL  Final    Radiology Reports Dg Chest 2 View  Result Date: 07/15/2016 CLINICAL DATA:  Cough and fatigue. EXAM: CHEST  2 VIEW COMPARISON:  09/27/2009 FINDINGS: The heart size and mediastinal contours are within normal limits. Both lungs are clear. The visualized skeletal structures are unremarkable. IMPRESSION: No  active cardiopulmonary disease. Electronically Signed   By: Ellery Plunk M.D.   On: 07/15/2016 23:42   Ct Chest W Contrast  Result Date: 08/07/2016 CLINICAL DATA:  Bloody emesis EXAM: CT CHEST, ABDOMEN, AND PELVIS WITH CONTRAST TECHNIQUE: Multidetector CT imaging of the chest, abdomen and pelvis was performed following the standard protocol during bolus administration of intravenous contrast. CONTRAST:  ISOVUE-300 IOPAMIDOL (ISOVUE-300) INJECTION 61% COMPARISON:  Chest radiograph 07/15/2016 FINDINGS: CT CHEST FINDINGS Cardiovascular: No significant vascular findings. Normal heart size. No pericardial effusion. Mediastinum/Nodes: No mediastinal, hilar or axillary lymphadenopathy. The visualized thyroid and thoracic esophageal course are unremarkable. Lungs/Pleura: Biapical scarring. No pleural effusion or pneumothorax. 4 mm nodule at the right lung base posteriorly. Musculoskeletal: Multilevel thoracic osteophytosis. No bony spinal canal stenosis. No rib fracture. CT ABDOMEN PELVIS FINDINGS Hepatobiliary: Status post cholecystectomy. Hypoattenuation of the liver compared to the spleen suggest hepatic steatosis. No biliary dilatation or focal hepatic lesion. Pancreas: Normal Spleen: Normal Adrenals/Urinary Tract: The adrenal glands are normal. There is no hydronephrosis or nephrolithiasis. No perinephric stranding or solid renal mass. Stomach/Bowel: There is a curvilinear focus of hyperdensity of equal attenuation to blood that courses from the gastroesophageal junction into the gastric lumen. This has the appearance of a artery, but it is not in the  gastric wall or any solid viscus and therefore may be active extravasation (axial images 48-51, coronal image 57, sagittal image 110) the. There is a small duodenal diverticulum. The remainder the duodenum is normal. There is no small bowel obstruction or acute inflammation. There is sigmoid diverticulosis without acute inflammation. The colon is mostly  decompressed. The appendix is normal. Vascular/Lymphatic: There is mixed calcified and noncalcified plaque within the abdominal aorta. No abdominal or pelvic lymphadenopathy. Reproductive: The uterus is normal. No adnexal mass. No free fluid in the pelvis. Other: None Musculoskeletal: Multilevel lumbar osteophytosis and facet arthrosis. No bony spinal canal stenosis. IMPRESSION: 1. Serpiginous hyperdensity within the gastric lumen, which has the appearance of a medium-sized blood vessel, but is not within the gastric wall and is thus suspicious for active hemorrhage. This could be a Dieulafoy lesion within the submucosa of the upper gastric wall, actively bleeding into the stomach. Another consideration would be a pedunculated gastric mass. Correlation with upper endoscopy should be considered. 2. No other acute abnormality of the chest, abdomen or pelvis. 3. Aortic atherosclerosis. 4. 4 mm posterior right lung base nodule. No follow-up needed if patient is low-risk. Non-contrast chest CT can be considered in 12 months if patient is high-risk. This recommendation follows the consensus statement: Guidelines for Management of Incidental Pulmonary Nodules Detected on CT Images: From the Fleischner Society 2017; Radiology 2017; 284:228-243. Critical Value/emergent results were called by telephone at the time of interpretation on 08/07/2016 at 10:20 pm to Dr. Linwood Dibbles , who verbally acknowledged these results. Electronically Signed   By: Deatra Robinson M.D.   On: 08/07/2016 22:27   Ct Abdomen Pelvis W Contrast  Result Date: 08/07/2016 CLINICAL DATA:  Bloody emesis EXAM: CT CHEST, ABDOMEN, AND PELVIS WITH CONTRAST TECHNIQUE: Multidetector CT imaging of the chest, abdomen and pelvis was performed following the standard protocol during bolus administration of intravenous contrast. CONTRAST:  ISOVUE-300 IOPAMIDOL (ISOVUE-300) INJECTION 61% COMPARISON:  Chest radiograph 07/15/2016 FINDINGS: CT CHEST FINDINGS  Cardiovascular: No significant vascular findings. Normal heart size. No pericardial effusion. Mediastinum/Nodes: No mediastinal, hilar or axillary lymphadenopathy. The visualized thyroid and thoracic esophageal course are unremarkable. Lungs/Pleura: Biapical scarring. No pleural effusion or pneumothorax. 4 mm nodule at the right lung base posteriorly. Musculoskeletal: Multilevel thoracic osteophytosis. No bony spinal canal stenosis. No rib fracture. CT ABDOMEN PELVIS FINDINGS Hepatobiliary: Status post cholecystectomy. Hypoattenuation of the liver compared to the spleen suggest hepatic steatosis. No biliary dilatation or focal hepatic lesion. Pancreas: Normal Spleen: Normal Adrenals/Urinary Tract: The adrenal glands are normal. There is no hydronephrosis or nephrolithiasis. No perinephric stranding or solid renal mass. Stomach/Bowel: There is a curvilinear focus of hyperdensity of equal attenuation to blood that courses from the gastroesophageal junction into the gastric lumen. This has the appearance of a artery, but it is not in the gastric wall or any solid viscus and therefore may be active extravasation (axial images 48-51, coronal image 57, sagittal image 110) the. There is a small duodenal diverticulum. The remainder the duodenum is normal. There is no small bowel obstruction or acute inflammation. There is sigmoid diverticulosis without acute inflammation. The colon is mostly decompressed. The appendix is normal. Vascular/Lymphatic: There is mixed calcified and noncalcified plaque within the abdominal aorta. No abdominal or pelvic lymphadenopathy. Reproductive: The uterus is normal. No adnexal mass. No free fluid in the pelvis. Other: None Musculoskeletal: Multilevel lumbar osteophytosis and facet arthrosis. No bony spinal canal stenosis. IMPRESSION: 1. Serpiginous hyperdensity within the gastric lumen, which has the appearance  of a medium-sized blood vessel, but is not within the gastric wall and is thus  suspicious for active hemorrhage. This could be a Dieulafoy lesion within the submucosa of the upper gastric wall, actively bleeding into the stomach. Another consideration would be a pedunculated gastric mass. Correlation with upper endoscopy should be considered. 2. No other acute abnormality of the chest, abdomen or pelvis. 3. Aortic atherosclerosis. 4. 4 mm posterior right lung base nodule. No follow-up needed if patient is low-risk. Non-contrast chest CT can be considered in 12 months if patient is high-risk. This recommendation follows the consensus statement: Guidelines for Management of Incidental Pulmonary Nodules Detected on CT Images: From the Fleischner Society 2017; Radiology 2017; 284:228-243. Critical Value/emergent results were called by telephone at the time of interpretation on 08/07/2016 at 10:20 pm to Dr. Linwood Dibbles , who verbally acknowledged these results. Electronically Signed   By: Deatra Robinson M.D.   On: 08/07/2016 22:27   Mm Screening Breast Tomo Bilateral  Result Date: 07/24/2016 CLINICAL DATA:  Screening. EXAM: 2D DIGITAL SCREENING BILATERAL MAMMOGRAM WITH CAD AND ADJUNCT TOMO COMPARISON:  Previous exam(s). ACR Breast Density Category b: There are scattered areas of fibroglandular density. FINDINGS: There are no findings suspicious for malignancy. Images were processed with CAD. IMPRESSION: No mammographic evidence of malignancy. A result letter of this screening mammogram will be mailed directly to the patient. RECOMMENDATION: Screening mammogram in one year. (Code:SM-B-01Y) BI-RADS CATEGORY  1: Negative. Electronically Signed   By: Sherian Rein M.D.   On: 07/24/2016 16:46    Time Spent in minutes  30   Pearson Grippe M.D on 08/09/2016 at 1:19 PM  Between 7am to 7pm - Pager - [phone]  After 7pm go to [email or web address] - password [email or web address]  [facility] -  Office  [phone]

## 2016-08-10 LAB — CBC
HCT: 24.5 % — ABNORMAL LOW (ref 36.0–46.0)
HEMOGLOBIN: 8.2 g/dL — AB (ref 12.0–15.0)
MCH: 28.4 pg (ref 26.0–34.0)
MCHC: 33.5 g/dL (ref 30.0–36.0)
MCV: 84.8 fL (ref 78.0–100.0)
PLATELETS: 214 10*3/uL (ref 150–400)
RBC: 2.89 MIL/uL — AB (ref 3.87–5.11)
RDW: 13.6 % (ref 11.5–15.5)
WBC: 7.2 10*3/uL (ref 4.0–10.5)

## 2016-08-10 LAB — COMPREHENSIVE METABOLIC PANEL
ALK PHOS: 36 U/L — AB (ref 38–126)
ALT: 14 U/L (ref 14–54)
ANION GAP: 6 (ref 5–15)
AST: 21 U/L (ref 15–41)
Albumin: 2.6 g/dL — ABNORMAL LOW (ref 3.5–5.0)
BUN: 11 mg/dL (ref 6–20)
CALCIUM: 8.1 mg/dL — AB (ref 8.9–10.3)
CO2: 23 mmol/L (ref 22–32)
CREATININE: 0.83 mg/dL (ref 0.44–1.00)
Chloride: 109 mmol/L (ref 101–111)
Glucose, Bld: 110 mg/dL — ABNORMAL HIGH (ref 65–99)
Potassium: 3.7 mmol/L (ref 3.5–5.1)
Sodium: 138 mmol/L (ref 135–145)
Total Bilirubin: 0.2 mg/dL — ABNORMAL LOW (ref 0.3–1.2)
Total Protein: 4.8 g/dL — ABNORMAL LOW (ref 6.5–8.1)

## 2016-08-10 NOTE — Progress Notes (Signed)
Patient ID: Margaret Lucas, female   DOB: 08-15-1934, 82 y.o.   MRN: [id number]                                                                PROGRESS NOTE                                                                                                                                                                                                             Patient Demographics:    Margaret Lucas, is a 82 y.o. female, DOB - Feb 09, 1935, WUJ:[id number]  Admit date - 08/07/2016   Admitting Physician Alberteen Sam, MD  Outpatient Primary MD for the patient is Thayer Headings, MD  LOS - 3  Outpatient Specialists: Chief Complaint  Patient presents with  . GI Bleeding  . Emesis       Brief Narrative  82 y.o.femalewith a past medical history significant for hypothyroidismwho presents with hematemesis.  The patient was in her usual state of health until this morning, she woke up with chills and malaise and went back to bed after taking the first dose of her new HCTZ for hypertension. Around mid day, she got up and felt nauseated, and had several watery vomits, followed by emesis with bright red blood. She had several more episodes of hematemesis and then came to the ER.  ED course: -Temp 77F, heart rate 77, pulse ox and respirations normal, blood pressure 220/80 -Na 118, K 4.4, Cr 0.62, WBC 20.8K, Hgb 12.8 -Lactic acid 2.74 -Lipase and BNP normal -Troponin negative -CT of the chest abdomen and pelvis were obtained that showed what appeared to be intraluminal blood in the stomach, no pneumonia, pancreatitis, hydronephrosis, SBO or other acute findings -She was given IV fluids and IV PPI and the case was discussed with GI who agreed to evaluate the patient in the morning and TRH were asked to admit for GI bleed    She takes no blood thinners, uses no NSAIDs. She does not use alcohol. Denies liver disease history. She has never had a GI bleed before. Has had colonoscopies  in the past, doesn't know what was found.   Subjective:    Margaret Lucas today still has black stool,  Seems to be clearing  hgb lower 8.0  Still on iv PPI. ,  No headache, No chest pain, No abdominal pain - No Nausea, No emesis. Slight loose stool.  No constipation.    No new weakness tingling or numbness, No Cough - SOB.    Assessment  & Plan :    Principal Problem:   Upper GI bleed Active Problems:   Hyponatremia   Elevated LFTs   Hypothyroidism, unspecified   Essential hypertension   Mallory-Weiss syndrome   UTI (urinary tract infection)   Acute blood loss anemia    1. Upper GI bleed: CT appears to show intraluminal blood in stomach, ?Dieulafoy lesion per report. No known history of varices or portal hypertension. -PPI IV gtt -advance diet as tolerated EGD 4/25 Appreciate GI input Pt still having black stool and drop in Hgb. Pt has not yet finished iv ppi, await further GI recommendations Cbc in am     2. Leukocytosis: improving, likely secondary to UTI No focal symptoms of infection. No pneumonia. Despite elevated lactic acid and leukocytosis, sepsis was not suspected at presentation. Lactate cleared with fluids. -blood cultures ngtd, urine culture <10,000CFU Rocephin iv D#4  3. Hyponatremia:improving Acute on chronic (baseline 130?). Suspect the acute portion is from vomiting today. -check bmp in am  4. Elevated LFTs:resolved Abdomen exam benign. -cmp in am  5. Lung nodule: 4mm, small. Patient non-smoker, no family history of lung cancer. No follow up necessary.  6. Hypertension: -Discontinue HCTZ given hyponatremia monitor bp today  7. Hypothyroidism: -Continue levothyroxine -Check TSH  8. Anemia Repeat cbc in am  DVT prophylaxis:SCD Code Status:FULL Family Communication:no family at bedside Disposition Plan: Consults called:GI Admission status:INPATIENT    Lab Results  Component Value Date   PLT 214  08/10/2016      Anti-infectives    Start     Dose/Rate Route Frequency Ordered Stop   08/08/16 0400  cefTRIAXone (ROCEPHIN) 1 g in dextrose 5 % 50 mL IVPB     1 g 100 mL/hr over 30 Minutes Intravenous Every 24 hours 08/08/16 0312          Objective:   Vitals:   08/09/16 1423 08/09/16 1423 08/09/16 2157 08/10/16 0551  BP: (!) 129/39 (!) 129/39 (!) 128/47 (!) 117/94  Pulse: 69 69 86 72  Resp: Temp: 98.1 F (36.7 C) 98.1 F (36.7 C) 98.5 F (36.9 C) 98.7 F (37.1 C)  TempSrc: Oral Oral    SpO2: 100% 100% 97% 99%  Weight:      Height:        Wt Readings from Last 3 Encounters:  08/08/16 57.1 kg (125 lb 14.4 oz)  07/15/16 56.2 kg (124 lb)     Intake/Output Summary (Last 24 hours) at 08/10/16 0831 Last data filed at 08/10/16 0600  Gross per 24 hour  Intake          2524.59 ml  Output              700 ml  Net          1824.59 ml     Physical Exam  Awake Alert, Oriented X 3, No new F.N deficits, Normal affect [place].AT,PERRAL, slight pale conjunctiva Supple Neck,No JVD, No cervical lymphadenopathy appriciated.  Symmetrical Chest wall movement, Good air movement bilaterally, CTAB RRR,No Gallops,Rubs or new Murmurs, No Parasternal Heave +ve B.Sounds, Abd Soft, No tenderness, No organomegaly appriciated, No rebound - guarding or rigidity. No Cyanosis, Clubbing or edema, No new Rash or bruise     Data Review:  CBC  Recent Labs Lab 08/07/16 2007 08/08/16 0222 08/09/16 0613 08/09/16 1430 08/10/16 0537  WBC 20.8* 11.5* 6.3  --  7.2  HGB 12.8 10.0* 8.0* 9.0* 8.2*  HCT 36.4 28.4* 23.6* 26.6* 24.5*  PLT 276 218 202  --  214  MCV 82.4 82.3 83.7  --  84.8  MCH 29.0 29.0 28.4  --  28.4  MCHC 35.2 35.2 33.9  --  33.5  RDW 12.8 12.7 13.2  --  13.6    Chemistries   Recent Labs Lab 08/07/16 2007 08/08/16 0222 08/09/16 0613 08/10/16 0537  NA 118* 124* 137 138  K 4.4 3.3* 3.9 3.7  CL 86* 95* 108 109  CO2 21* 21* 25 23  GLUCOSE 171* 131* 98  110*  BUN CREATININE 0.62 0.57 0.84 0.83  CALCIUM 8.5* 7.8* 8.2* 8.1*  AST 43* ALT 23 15 13* 14  ALKPHOS 60 42 33* 36*  BILITOT 1.4* 0.9 0.4 0.2*   ------------------------------------------------------------------------------------------------------------------ No results for input(s): CHOL, HDL, LDLCALC, TRIG, CHOLHDL, LDLDIRECT in the last 72 hours.  No results found for: HGBA1C ------------------------------------------------------------------------------------------------------------------  Recent Labs  08/08/16 0359  TSH 0.644   ------------------------------------------------------------------------------------------------------------------ No results for input(s): VITAMINB12, FOLATE, FERRITIN, TIBC, IRON, RETICCTPCT in the last 72 hours.  Coagulation profile No results for input(s): INR, PROTIME in the last 168 hours.  No results for input(s): DDIMER in the last 72 hours.  Cardiac Enzymes No results for input(s): CKMB, TROPONINI, MYOGLOBIN in the last 168 hours.  Invalid input(s): CK ------------------------------------------------------------------------------------------------------------------    Component Value Date/Time   BNP 67.6 08/07/2016 2007    Inpatient Medications  Scheduled Meds: . feeding supplement (PRO-STAT SUGAR FREE 64)  30 mL Oral BID  . levothyroxine  100 mcg Oral QAC breakfast  . [START ON 08/11/2016] pantoprazole  40 mg Intravenous Q12H   Continuous Infusions: . cefTRIAXone (ROCEPHIN)  IV Stopped (08/10/16 0446)  . pantoprozole (PROTONIX) infusion 8 mg/hr (08/09/16 2339)   PRN Meds:.acetaminophen, HYDROmorphone (DILAUDID) injection, meclizine, ondansetron **OR** ondansetron (ZOFRAN) IV  Micro Results Recent Results (from the past 240 hour(s))  Culture, blood (x 2)     Status: None (Preliminary result)   Collection Time: 08/08/16  2:22 AM  Result Value Ref Range Status   Specimen Description BLOOD RIGHT HAND   Final   Special Requests   Final    BOTTLES DRAWN AEROBIC AND ANAEROBIC Blood Culture adequate volume   Culture NO GROWTH 1 DAY  Final   Report Status PENDING  Incomplete  Culture, blood (x 2)     Status: None (Preliminary result)   Collection Time: 08/08/16  2:35 AM  Result Value Ref Range Status   Specimen Description BLOOD LEFT ARM  Final   Special Requests   Final    BOTTLES DRAWN AEROBIC AND ANAEROBIC Blood Culture adequate volume   Culture NO GROWTH 1 DAY  Final   Report Status PENDING  Incomplete  Culture, Urine     Status: Abnormal   Collection Time: 08/08/16  3:30 AM  Result Value Ref Range Status   Specimen Description URINE, RANDOM  Final   Special Requests NONE  Final   Culture <10,000 COLONIES/mL INSIGNIFICANT GROWTH (A)  Final   Report Status 08/09/2016 FINAL  Final  Culture, blood (routine x 2)     Status: None (Preliminary result)   Collection Time: 08/08/16  4:10 AM  Result Value Ref Range Status   Specimen Description BLOOD RIGHT HAND  Final   Special Requests   Final    BOTTLES DRAWN AEROBIC AND ANAEROBIC Blood Culture adequate volume   Culture NO GROWTH 1 DAY  Final   Report Status PENDING  Incomplete  Culture, blood (routine x 2)     Status: None (Preliminary result)   Collection Time: 08/08/16  4:16 AM  Result Value Ref Range Status   Specimen Description BLOOD LEFT ARM  Final   Special Requests   Final    BOTTLES DRAWN AEROBIC AND ANAEROBIC Blood Culture adequate volume   Culture NO GROWTH 1 DAY  Final   Report Status PENDING  Incomplete    Radiology Reports Dg Chest 2 View  Result Date: 07/15/2016 CLINICAL DATA:  Cough and fatigue. EXAM: CHEST  2 VIEW COMPARISON:  09/27/2009 FINDINGS: The heart size and mediastinal contours are within normal limits. Both lungs are clear. The visualized skeletal structures are unremarkable. IMPRESSION: No active cardiopulmonary disease. Electronically Signed   By: Ellery Plunk M.D.   On: 07/15/2016 23:42   Ct  Chest W Contrast  Result Date: 08/07/2016 CLINICAL DATA:  Bloody emesis EXAM: CT CHEST, ABDOMEN, AND PELVIS WITH CONTRAST TECHNIQUE: Multidetector CT imaging of the chest, abdomen and pelvis was performed following the standard protocol during bolus administration of intravenous contrast. CONTRAST:  ISOVUE-300 IOPAMIDOL (ISOVUE-300) INJECTION 61% COMPARISON:  Chest radiograph 07/15/2016 FINDINGS: CT CHEST FINDINGS Cardiovascular: No significant vascular findings. Normal heart size. No pericardial effusion. Mediastinum/Nodes: No mediastinal, hilar or axillary lymphadenopathy. The visualized thyroid and thoracic esophageal course are unremarkable. Lungs/Pleura: Biapical scarring. No pleural effusion or pneumothorax. 4 mm nodule at the right lung base posteriorly. Musculoskeletal: Multilevel thoracic osteophytosis. No bony spinal canal stenosis. No rib fracture. CT ABDOMEN PELVIS FINDINGS Hepatobiliary: Status post cholecystectomy. Hypoattenuation of the liver compared to the spleen suggest hepatic steatosis. No biliary dilatation or focal hepatic lesion. Pancreas: Normal Spleen: Normal Adrenals/Urinary Tract: The adrenal glands are normal. There is no hydronephrosis or nephrolithiasis. No perinephric stranding or solid renal mass. Stomach/Bowel: There is a curvilinear focus of hyperdensity of equal attenuation to blood that courses from the gastroesophageal junction into the gastric lumen. This has the appearance of a artery, but it is not in the gastric wall or any solid viscus and therefore may be active extravasation (axial images 48-51, coronal image 57, sagittal image 110) the. There is a small duodenal diverticulum. The remainder the duodenum is normal. There is no small bowel obstruction or acute inflammation. There is sigmoid diverticulosis without acute inflammation. The colon is mostly decompressed. The appendix is normal. Vascular/Lymphatic: There is mixed calcified and noncalcified plaque within  the abdominal aorta. No abdominal or pelvic lymphadenopathy. Reproductive: The uterus is normal. No adnexal mass. No free fluid in the pelvis. Other: None Musculoskeletal: Multilevel lumbar osteophytosis and facet arthrosis. No bony spinal canal stenosis. IMPRESSION: 1. Serpiginous hyperdensity within the gastric lumen, which has the appearance of a medium-sized blood vessel, but is not within the gastric wall and is thus suspicious for active hemorrhage. This could be a Dieulafoy lesion within the submucosa of the upper gastric wall, actively bleeding into the stomach. Another consideration would be a pedunculated gastric mass. Correlation with upper endoscopy should be considered. 2. No other acute abnormality of the chest, abdomen or pelvis. 3. Aortic atherosclerosis. 4. 4 mm posterior right lung base nodule. No follow-up needed if patient is low-risk. Non-contrast chest CT can be considered in 12 months if patient is high-risk. This recommendation follows the consensus statement: Guidelines for  Management of Incidental Pulmonary Nodules Detected on CT Images: From the Fleischner Society 2017; Radiology 2017; 284:228-243. Critical Value/emergent results were called by telephone at the time of interpretation on 08/07/2016 at 10:20 pm to Dr. Linwood Dibbles , who verbally acknowledged these results. Electronically Signed   By: Deatra Robinson M.D.   On: 08/07/2016 22:27   Ct Abdomen Pelvis W Contrast  Result Date: 08/07/2016 CLINICAL DATA:  Bloody emesis EXAM: CT CHEST, ABDOMEN, AND PELVIS WITH CONTRAST TECHNIQUE: Multidetector CT imaging of the chest, abdomen and pelvis was performed following the standard protocol during bolus administration of intravenous contrast. CONTRAST:  ISOVUE-300 IOPAMIDOL (ISOVUE-300) INJECTION 61% COMPARISON:  Chest radiograph 07/15/2016 FINDINGS: CT CHEST FINDINGS Cardiovascular: No significant vascular findings. Normal heart size. No pericardial effusion. Mediastinum/Nodes: No  mediastinal, hilar or axillary lymphadenopathy. The visualized thyroid and thoracic esophageal course are unremarkable. Lungs/Pleura: Biapical scarring. No pleural effusion or pneumothorax. 4 mm nodule at the right lung base posteriorly. Musculoskeletal: Multilevel thoracic osteophytosis. No bony spinal canal stenosis. No rib fracture. CT ABDOMEN PELVIS FINDINGS Hepatobiliary: Status post cholecystectomy. Hypoattenuation of the liver compared to the spleen suggest hepatic steatosis. No biliary dilatation or focal hepatic lesion. Pancreas: Normal Spleen: Normal Adrenals/Urinary Tract: The adrenal glands are normal. There is no hydronephrosis or nephrolithiasis. No perinephric stranding or solid renal mass. Stomach/Bowel: There is a curvilinear focus of hyperdensity of equal attenuation to blood that courses from the gastroesophageal junction into the gastric lumen. This has the appearance of a artery, but it is not in the gastric wall or any solid viscus and therefore may be active extravasation (axial images 48-51, coronal image 57, sagittal image 110) the. There is a small duodenal diverticulum. The remainder the duodenum is normal. There is no small bowel obstruction or acute inflammation. There is sigmoid diverticulosis without acute inflammation. The colon is mostly decompressed. The appendix is normal. Vascular/Lymphatic: There is mixed calcified and noncalcified plaque within the abdominal aorta. No abdominal or pelvic lymphadenopathy. Reproductive: The uterus is normal. No adnexal mass. No free fluid in the pelvis. Other: None Musculoskeletal: Multilevel lumbar osteophytosis and facet arthrosis. No bony spinal canal stenosis. IMPRESSION: 1. Serpiginous hyperdensity within the gastric lumen, which has the appearance of a medium-sized blood vessel, but is not within the gastric wall and is thus suspicious for active hemorrhage. This could be a Dieulafoy lesion within the submucosa of the upper gastric wall,  actively bleeding into the stomach. Another consideration would be a pedunculated gastric mass. Correlation with upper endoscopy should be considered. 2. No other acute abnormality of the chest, abdomen or pelvis. 3. Aortic atherosclerosis. 4. 4 mm posterior right lung base nodule. No follow-up needed if patient is low-risk. Non-contrast chest CT can be considered in 12 months if patient is high-risk. This recommendation follows the consensus statement: Guidelines for Management of Incidental Pulmonary Nodules Detected on CT Images: From the Fleischner Society 2017; Radiology 2017; 284:228-243. Critical Value/emergent results were called by telephone at the time of interpretation on 08/07/2016 at 10:20 pm to Dr. Linwood Dibbles , who verbally acknowledged these results. Electronically Signed   By: Deatra Robinson M.D.   On: 08/07/2016 22:27   Mm Screening Breast Tomo Bilateral  Result Date: 07/24/2016 CLINICAL DATA:  Screening. EXAM: 2D DIGITAL SCREENING BILATERAL MAMMOGRAM WITH CAD AND ADJUNCT TOMO COMPARISON:  Previous exam(s). ACR Breast Density Category b: There are scattered areas of fibroglandular density. FINDINGS: There are no findings suspicious for malignancy. Images were processed with CAD. IMPRESSION: No mammographic  evidence of malignancy. A result letter of this screening mammogram will be mailed directly to the patient. RECOMMENDATION: Screening mammogram in one year. (Code:SM-B-01Y) BI-RADS CATEGORY  1: Negative. Electronically Signed   By: Sherian Rein M.D.   On: 07/24/2016 16:46    Time Spent in minutes  30   Pearson Grippe M.D on 08/10/2016 at 8:31 AM  Between 7am to 7pm - Pager - [phone]  After 7pm go to [email or web address] - password [facility]  [facility] -  Office  [phone]

## 2016-08-10 NOTE — Care Management Important Message (Signed)
Important Message  Patient Details  Name: Margaret Lucas MRN: [id number] Date of Birth: 06/24/1934   Medicare Important Message Given:  Yes    Kyla Balzarine 08/10/2016, 2:36 PM

## 2016-08-11 LAB — COMPREHENSIVE METABOLIC PANEL
ALK PHOS: 49 U/L (ref 38–126)
ALT: 13 U/L — ABNORMAL LOW (ref 14–54)
ANION GAP: 6 (ref 5–15)
AST: 17 U/L (ref 15–41)
Albumin: 2.7 g/dL — ABNORMAL LOW (ref 3.5–5.0)
BUN: 13 mg/dL (ref 6–20)
CALCIUM: 8.5 mg/dL — AB (ref 8.9–10.3)
CO2: 27 mmol/L (ref 22–32)
CREATININE: 0.8 mg/dL (ref 0.44–1.00)
Chloride: 104 mmol/L (ref 101–111)
Glucose, Bld: 108 mg/dL — ABNORMAL HIGH (ref 65–99)
Potassium: 4.3 mmol/L (ref 3.5–5.1)
SODIUM: 137 mmol/L (ref 135–145)
TOTAL PROTEIN: 5 g/dL — AB (ref 6.5–8.1)
Total Bilirubin: 0.3 mg/dL (ref 0.3–1.2)

## 2016-08-11 LAB — CBC
HCT: 25.1 % — ABNORMAL LOW (ref 36.0–46.0)
HEMOGLOBIN: 8.5 g/dL — AB (ref 12.0–15.0)
MCH: 28.8 pg (ref 26.0–34.0)
MCHC: 33.9 g/dL (ref 30.0–36.0)
MCV: 85.1 fL (ref 78.0–100.0)
PLATELETS: 256 10*3/uL (ref 150–400)
RBC: 2.95 MIL/uL — ABNORMAL LOW (ref 3.87–5.11)
RDW: 13.6 % (ref 11.5–15.5)
WBC: 8.9 10*3/uL (ref 4.0–10.5)

## 2016-08-11 MED ORDER — PANTOPRAZOLE SODIUM 40 MG PO TBEC: 40.0000 mg | DELAYED_RELEASE_TABLET | Freq: Two times a day (BID) | ORAL | 0 refills | Status: AC

## 2016-08-11 NOTE — Progress Notes (Signed)
Margaret Lucas to be D/C'd home per MD order.  Discussed with the patient and all questions fully answered.  VSS, Skin clean, dry and intact without evidence of skin break down, no evidence of skin tears noted. IV catheter discontinued intact. Site without signs and symptoms of complications. Dressing and pressure applied.  An After Visit Summary was printed and given to the patient. Patient received prescription.  D/c education completed with patient/family including follow up instructions, medication list, d/c activities limitations if indicated, with other d/c instructions as indicated by MD - patient able to verbalize understanding, all questions fully answered.   Patient instructed to return to ED, call 911, or call MD for any changes in condition.   Patient escorted via WC, and D/C home via private auto.  Evern Bio 08/11/2016 4:24 PM

## 2016-08-11 NOTE — Care Management Note (Signed)
Case Management Note  Patient Details  Name: Margaret Lucas MRN: [id number] Date of Birth: 07-May-1934  Subjective/Objective:                 Patient with order to DC to home. No CM needs, orders, or consults identified at this time.    Action/Plan:  DC to home  Expected Discharge Date:  08/11/16               Expected Discharge Plan:  Home/Self Care  In-House Referral:     Discharge planning Services  CM Consult  Post Acute Care Choice:    Choice offered to:     DME Arranged:    DME Agency:     HH Arranged:    HH Agency:     Status of Service:  Completed, signed off  If discussed at [employer] of Stay Meetings, dates discussed:    Additional Comments:  Lawerance Sabal, RN 08/11/2016, 1:03 PM

## 2016-08-11 NOTE — Discharge Summary (Signed)
Margaret Lucas, is a 82 y.o. female  DOB 10/16/1934  MRN [id number].  Admission date:  08/07/2016  Admitting Physician  Alberteen Sam, MD  Discharge Date:  08/11/2016   Primary MD  Thayer Headings, MD  Recommendations for primary care physician for things to follow:    1. Upper GI bleed: CT appears to show intraluminal blood in stomach, ?Dieulafoy lesion per report. No known history of varices or portal hypertension. EGD 4/25=> Mallory Weiss Tear Continue protonix 40mg  po bid Needs CBC in 1 week    2. Leukocytosis: resolved, likely secondary to UTI -blood cultures ngtd, urine culture <10,000CFU Rocephin iv D#5 Discharge on keflex 250mg  po bid x 2 days  3. Hyponatremia ? Secondary to n/v:resolved Check cmp in 1week  4. Elevated LFTs:resolved Check cmp in 1 week  5. Lung nodule: 4mm, small. Patient non-smoker, no family history of lung cancer. No follow up necessary.  6. Hypertension:bp good while in hospital Off HCTZ given hyponatremia Check bp in 1 week,  If rises then consider amlodipine   7. Hypothyroidism: Continue levothyroxine     Admission Diagnosis  Hyponatremia [E87.1] Elevated blood pressure reading [R03.0] Lactic acid acidosis [E87.2] Hematemesis with nausea [K92.0] Leukocytosis, unspecified type [D72.829]   Discharge Diagnosis  Hyponatremia [E87.1] Elevated blood pressure reading [R03.0] Lactic acid acidosis [E87.2] Hematemesis with nausea [K92.0] Leukocytosis, unspecified type [D72.829]    Principal Problem:   Upper GI bleed Active Problems:   Hyponatremia   Elevated LFTs   Hypothyroidism, unspecified   Essential hypertension   Mallory-Weiss syndrome   UTI (urinary tract infection)   Acute blood loss anemia      Past Medical History:  Diagnosis Date  . Diabetes mellitus without complication (HCC)   . Heart murmur   .  Hypertension   . 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 Osteoporosis   . Vertigo     Past Surgical History:  Procedure Laterality Date  . CHOLECYSTECTOMY    . COLONOSCOPY  2010 last  . ESOPHAGOGASTRODUODENOSCOPY N/A 08/08/2016   Procedure: ESOPHAGOGASTRODUODENOSCOPY (EGD);  Surgeon: Iva Boop, MD;  Location: [facility] ENDOSCOPY;  Service: Endoscopy;  Laterality: N/A;  . 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   . TUBAL LIGATION         HPI  from the history and physical done on the day of admission:      82 y.o.femalewith a past medical history significant for hypothyroidismwho presents with hematemesis.  The patient was in her usual state of health until this morning, she woke up with chills and malaise and went back to bed after taking the first dose of her new HCTZ for hypertension. Around mid day, she got up and felt nauseated, and had several watery vomits, followed by emesis with bright red blood. She had several more episodes of hematemesis and then came to the ER.  ED course: -Temp 40F, heart rate 77, pulse ox and respirations normal, blood pressure  220/80 -Na 118, K 4.4, Cr 0.62, WBC 20.8K, Hgb 12.8 -Lactic acid 2.74 -Lipase and BNP normal -Troponin negative -CT of the chest abdomen and pelvis were obtained that showed what appeared to be intraluminal blood in the stomach, no pneumonia, pancreatitis, hydronephrosis, SBO or other acute findings -She was given IV fluids and IV PPI and the case was discussed with GI who agreed to evaluate the patient in the morning and TRH were asked to admit for GI bleed    She takes no blood thinners, uses no NSAIDs. She does not use alcohol. Denies liver disease history. She has never had a GI bleed before. Has had colonoscopies in the past, doesn't know what was found.    Hospital Course:     Pt was admitted and placed on protonix gtt.  Her nause and vomitting thought to be  secondary to Uti.  Pt tx w rocephin.  Pt  had EGD 4/25=> mallory weiss tear,  Pt continued after EGD to have complains of black stool and Hgb dropped to low of 8.2  .  She was continued on protonix gtt for total of 72 hours. Her black stool resolved over the past 24 hours.  Pt denies any abdominal pain, diarrhea, constipation, brbpr.  Pt Hgb today is 8.5.  Pt will be placed on protonix  po bid.  Aspirin has been held since admission. Raloxifene should be held as well.  Pt realizes no NSAIDS.  Pt appears stable and will be discharge home today.      Follow UP  Follow-up Information    MACKENZIE,BRIAN, MD Follow up in 1 week(s).   Specialty:  Internal Medicine Contact information: [address] [place] [address] [phone]            Consults obtained - GI  Discharge Condition: stable  Diet and Activity recommendation: See Discharge Instructions below  Discharge Instructions    No aspirin, No NSAIDS, if having rectal bleeding or further black stool call PCP or come to ER for evaluation .     Discharge Medications     Allergies as of 08/11/2016      Reactions   Demerol [meperidine] Other (See Comments)   unknown      Medication List    STOP taking these medications   aspirin EC 81 MG tablet   raloxifene 60 MG tablet Commonly known as:  EVISTA     TAKE these medications   CALCIUM 600+D 600-200 MG-UNIT Tabs Generic drug:  Calcium Carbonate-Vitamin D Take 1 tablet by mouth daily.   cholecalciferol 1000 units tablet Commonly known as:  VITAMIN D Take 5,000 Units by mouth daily.   fexofenadine-pseudoephedrine 60-120 MG 12 hr tablet Commonly known as:  ALLEGRA-D Take 1 tablet by mouth every other day.   fluticasone 50 MCG/ACT nasal spray Commonly known as:  FLONASE Place 1-2 sprays into both nostrils daily.   levothyroxine 100 MCG tablet Commonly known as:  SYNTHROID, LEVOTHROID Take 100 mcg by mouth daily before breakfast.     meclizine 25 MG tablet Commonly known as:  ANTIVERT Take 1 tablet (25 mg total) by mouth 3 (three) times daily as needed for dizziness.   pantoprazole 40 MG tablet Commonly known as:  PROTONIX Take 1 tablet (40 mg total) by mouth 2 (two) times daily.       Major procedures and Radiology Reports - PLEASE review detailed and final reports for all details, in brief -      Dg Chest 2  View  Result Date: 07/15/2016 CLINICAL DATA:  Cough and fatigue. EXAM: CHEST  2 VIEW COMPARISON:  09/27/2009 FINDINGS: The heart size and mediastinal contours are within normal limits. Both lungs are clear. The visualized skeletal structures are unremarkable. IMPRESSION: No active cardiopulmonary disease. Electronically Signed   By: Ellery Plunk M.D.   On: 07/15/2016 23:42   Ct Chest W Contrast  Result Date: 08/07/2016 CLINICAL DATA:  Bloody emesis EXAM: CT CHEST, ABDOMEN, AND PELVIS WITH CONTRAST TECHNIQUE: Multidetector CT imaging of the chest, abdomen and pelvis was performed following the standard protocol during bolus administration of intravenous contrast. CONTRAST:  ISOVUE-300 IOPAMIDOL (ISOVUE-300) INJECTION 61% COMPARISON:  Chest radiograph 07/15/2016 FINDINGS: CT CHEST FINDINGS Cardiovascular: No significant vascular findings. Normal heart size. No pericardial effusion. Mediastinum/Nodes: No mediastinal, hilar or axillary lymphadenopathy. The visualized thyroid and thoracic esophageal course are unremarkable. Lungs/Pleura: Biapical scarring. No pleural effusion or pneumothorax. 4 mm nodule at the right lung base posteriorly. Musculoskeletal: Multilevel thoracic osteophytosis. No bony spinal canal stenosis. No rib fracture. CT ABDOMEN PELVIS FINDINGS Hepatobiliary: Status post cholecystectomy. Hypoattenuation of the liver compared to the spleen suggest hepatic steatosis. No biliary dilatation or focal hepatic lesion. Pancreas: Normal Spleen: Normal Adrenals/Urinary Tract: The adrenal glands are  normal. There is no hydronephrosis or nephrolithiasis. No perinephric stranding or solid renal mass. Stomach/Bowel: There is a curvilinear focus of hyperdensity of equal attenuation to blood that courses from the gastroesophageal junction into the gastric lumen. This has the appearance of a artery, but it is not in the gastric wall or any solid viscus and therefore may be active extravasation (axial images 48-51, coronal image 57, sagittal image 110) the. There is a small duodenal diverticulum. The remainder the duodenum is normal. There is no small bowel obstruction or acute inflammation. There is sigmoid diverticulosis without acute inflammation. The colon is mostly decompressed. The appendix is normal. Vascular/Lymphatic: There is mixed calcified and noncalcified plaque within the abdominal aorta. No abdominal or pelvic lymphadenopathy. Reproductive: The uterus is normal. No adnexal mass. No free fluid in the pelvis. Other: None Musculoskeletal: Multilevel lumbar osteophytosis and facet arthrosis. No bony spinal canal stenosis. IMPRESSION: 1. Serpiginous hyperdensity within the gastric lumen, which has the appearance of a medium-sized blood vessel, but is not within the gastric wall and is thus suspicious for active hemorrhage. This could be a Dieulafoy lesion within the submucosa of the upper gastric wall, actively bleeding into the stomach. Another consideration would be a pedunculated gastric mass. Correlation with upper endoscopy should be considered. 2. No other acute abnormality of the chest, abdomen or pelvis. 3. Aortic atherosclerosis. 4. 4 mm posterior right lung base nodule. No follow-up needed if patient is low-risk. Non-contrast chest CT can be considered in 12 months if patient is high-risk. This recommendation follows the consensus statement: Guidelines for Management of Incidental Pulmonary Nodules Detected on CT Images: From the Fleischner Society 2017; Radiology 2017; 284:228-243. Critical  Value/emergent results were called by telephone at the time of interpretation on 08/07/2016 at 10:20 pm to Dr. Linwood Dibbles , who verbally acknowledged these results. Electronically Signed   By: Deatra Robinson M.D.   On: 08/07/2016 22:27   Ct Abdomen Pelvis W Contrast  Result Date: 08/07/2016 CLINICAL DATA:  Bloody emesis EXAM: CT CHEST, ABDOMEN, AND PELVIS WITH CONTRAST TECHNIQUE: Multidetector CT imaging of the chest, abdomen and pelvis was performed following the standard protocol during bolus administration of intravenous contrast. CONTRAST:  ISOVUE-300 IOPAMIDOL (ISOVUE-300) INJECTION 61% COMPARISON:  Chest radiograph  07/15/2016 FINDINGS: CT CHEST FINDINGS Cardiovascular: No significant vascular findings. Normal heart size. No pericardial effusion. Mediastinum/Nodes: No mediastinal, hilar or axillary lymphadenopathy. The visualized thyroid and thoracic esophageal course are unremarkable. Lungs/Pleura: Biapical scarring. No pleural effusion or pneumothorax. 4 mm nodule at the right lung base posteriorly. Musculoskeletal: Multilevel thoracic osteophytosis. No bony spinal canal stenosis. No rib fracture. CT ABDOMEN PELVIS FINDINGS Hepatobiliary: Status post cholecystectomy. Hypoattenuation of the liver compared to the spleen suggest hepatic steatosis. No biliary dilatation or focal hepatic lesion. Pancreas: Normal Spleen: Normal Adrenals/Urinary Tract: The adrenal glands are normal. There is no hydronephrosis or nephrolithiasis. No perinephric stranding or solid renal mass. Stomach/Bowel: There is a curvilinear focus of hyperdensity of equal attenuation to blood that courses from the gastroesophageal junction into the gastric lumen. This has the appearance of a artery, but it is not in the gastric wall or any solid viscus and therefore may be active extravasation (axial images 48-51, coronal image 57, sagittal image 110) the. There is a small duodenal diverticulum. The remainder the duodenum is normal. There  is no small bowel obstruction or acute inflammation. There is sigmoid diverticulosis without acute inflammation. The colon is mostly decompressed. The appendix is normal. Vascular/Lymphatic: There is mixed calcified and noncalcified plaque within the abdominal aorta. No abdominal or pelvic lymphadenopathy. Reproductive: The uterus is normal. No adnexal mass. No free fluid in the pelvis. Other: None Musculoskeletal: Multilevel lumbar osteophytosis and facet arthrosis. No bony spinal canal stenosis. IMPRESSION: 1. Serpiginous hyperdensity within the gastric lumen, which has the appearance of a medium-sized blood vessel, but is not within the gastric wall and is thus suspicious for active hemorrhage. This could be a Dieulafoy lesion within the submucosa of the upper gastric wall, actively bleeding into the stomach. Another consideration would be a pedunculated gastric mass. Correlation with upper endoscopy should be considered. 2. No other acute abnormality of the chest, abdomen or pelvis. 3. Aortic atherosclerosis. 4. 4 mm posterior right lung base nodule. No follow-up needed if patient is low-risk. Non-contrast chest CT can be considered in 12 months if patient is high-risk. This recommendation follows the consensus statement: Guidelines for Management of Incidental Pulmonary Nodules Detected on CT Images: From the Fleischner Society 2017; Radiology 2017; 284:228-243. Critical Value/emergent results were called by telephone at the time of interpretation on 08/07/2016 at 10:20 pm to Dr. Linwood Dibbles , who verbally acknowledged these results. Electronically Signed   By: Deatra Robinson M.D.   On: 08/07/2016 22:27   Mm Screening Breast Tomo Bilateral  Result Date: 07/24/2016 CLINICAL DATA:  Screening. EXAM: 2D DIGITAL SCREENING BILATERAL MAMMOGRAM WITH CAD AND ADJUNCT TOMO COMPARISON:  Previous exam(s). ACR Breast Density Category b: There are scattered areas of fibroglandular density. FINDINGS: There are no findings  suspicious for malignancy. Images were processed with CAD. IMPRESSION: No mammographic evidence of malignancy. A result letter of this screening mammogram will be mailed directly to the patient. RECOMMENDATION: Screening mammogram in one year. (Code:SM-B-01Y) BI-RADS CATEGORY  1: Negative. Electronically Signed   By: Sherian Rein M.D.   On: 07/24/2016 16:46    Micro Results     Recent Results (from the past 240 hour(s))  Culture, blood (x 2)     Status: None (Preliminary result)   Collection Time: 08/08/16  2:22 AM  Result Value Ref Range Status   Specimen Description BLOOD RIGHT HAND  Final   Special Requests   Final    BOTTLES DRAWN AEROBIC AND ANAEROBIC Blood Culture adequate volume  Culture NO GROWTH 2 DAYS  Final   Report Status PENDING  Incomplete  Culture, blood (x 2)     Status: None (Preliminary result)   Collection Time: 08/08/16  2:35 AM  Result Value Ref Range Status   Specimen Description BLOOD LEFT ARM  Final   Special Requests   Final    BOTTLES DRAWN AEROBIC AND ANAEROBIC Blood Culture adequate volume   Culture NO GROWTH 2 DAYS  Final   Report Status PENDING  Incomplete  Culture, Urine     Status: Abnormal   Collection Time: 08/08/16  3:30 AM  Result Value Ref Range Status   Specimen Description URINE, RANDOM  Final   Special Requests NONE  Final   Culture <10,000 COLONIES/mL INSIGNIFICANT GROWTH (A)  Final   Report Status 08/09/2016 FINAL  Final  Culture, blood (routine x 2)     Status: None (Preliminary result)   Collection Time: 08/08/16  4:10 AM  Result Value Ref Range Status   Specimen Description BLOOD RIGHT HAND  Final   Special Requests   Final    BOTTLES DRAWN AEROBIC AND ANAEROBIC Blood Culture adequate volume   Culture NO GROWTH 2 DAYS  Final   Report Status PENDING  Incomplete  Culture, blood (routine x 2)     Status: None (Preliminary result)   Collection Time: 08/08/16  4:16 AM  Result Value Ref Range Status   Specimen Description BLOOD LEFT  ARM  Final   Special Requests   Final    BOTTLES DRAWN AEROBIC AND ANAEROBIC Blood Culture adequate volume   Culture NO GROWTH 2 DAYS  Final   Report Status PENDING  Incomplete       Today   Subjective    Margaret Lucas today has no black stool.  Doing well,  Denies fever, chills,  no headache,no chest , no abdominal pain,no new weakness tingling or numbness, feels much better wants to go home today.    Objective   Blood pressure (!) 137/51, pulse 71, temperature 98.6 F (37 C), resp. rate 18, height 5\' 3"  (1.6 m), weight 57.1 kg (125 lb 14.4 oz), SpO2 100 %.   Intake/Output Summary (Last 24 hours) at 08/11/16 0749 Last data filed at 08/11/16 0700  Gross per 24 hour  Intake               50 ml  Output              100 ml  Net              -50 ml    Exam Awake Alert, Oriented x 3, No new F.N deficits, Normal affect [place].AT,PERRAL Supple Neck,No JVD, No cervical lymphadenopathy appriciated.  Symmetrical Chest wall movement, Good air movement bilaterally, CTAB RRR,No Gallops,Rubs or new Murmurs, No Parasternal Heave +ve B.Sounds, Abd Soft, Non tender, No organomegaly appriciated, No rebound -guarding or rigidity. No Cyanosis, Clubbing or edema, No new Rash or bruise   Data Review   CBC w Diff: Lab Results  Component Value Date   WBC 8.9 08/11/2016   HGB 8.5 (L) 08/11/2016   HCT 25.1 (L) 08/11/2016   PLT 256 08/11/2016    CMP: Lab Results  Component Value Date   NA 137 08/11/2016   K 4.3 08/11/2016   CL 104 08/11/2016   CO2 27 08/11/2016   BUN 13 08/11/2016   CREATININE 0.80 08/11/2016   PROT 5.0 (L) 08/11/2016   ALBUMIN 2.7 (L) 08/11/2016   BILITOT 0.3  08/11/2016   ALKPHOS 49 08/11/2016   AST 17 08/11/2016   ALT 13 (L) 08/11/2016  .   Total Time in preparing paper work, data evaluation and todays exam - 35 minutes  Pearson Grippe M.D on 08/11/2016 at 7:49 AM  [facility]   Office  [phone]

## 2016-08-13 LAB — CULTURE, BLOOD (ROUTINE X 2)
CULTURE: NO GROWTH
CULTURE: NO GROWTH
CULTURE: NO GROWTH
Culture: NO GROWTH
SPECIAL REQUESTS: ADEQUATE
Special Requests: ADEQUATE
Special Requests: ADEQUATE
Special Requests: ADEQUATE

## 2016-12-06 NOTE — Addendum Note (Signed)
Addendum  created 12/06/16 1331 by Virgene Tirone, MD   Sign clinical note    

## 2017-01-15 ENCOUNTER — Telehealth: Payer: Self-pay | Admitting: Cardiology

## 2017-01-15 NOTE — Telephone Encounter (Signed)
Received records from [facility]

## 2017-01-15 NOTE — Telephone Encounter (Signed)
Received records from [facility] for appointment on 01/16/17 with Dr Swaziland.  Records put with Dr Elvis Coil schedule for 01/16/17. lp

## 2017-01-15 NOTE — Progress Notes (Signed)
Cardiology Office Note   Date:  01/16/2017   ID:  Margaret Lucas, DOB Jul 05, 1934, MRN [id number]  PCP:  Thayer Headings, MD  Cardiologist:   Aasir Daigler [place], MD   Chief Complaint  Patient presents with  . Follow-up    Referral by Dr Thea Silversmith      History of Present Illness: Margaret Lucas is a 82 y.o. female who is seen at the request of Dr. Thea Silversmith for evaluation of aortic stenosis.  She has a history of HTN and DM- diet controlled. She mentions she has a murmur dating back 4-5 years. She had an Echo in 2014 showing some AV sclerosis. Repeat in 2017 showed mild AS but valve velocity was really unchanged. She denies any chest pain, dyspnea, palpitations, edema, orthopnea, dizziness or syncope. She is still quite active doing her own house and yard work and gardening.    Past Medical History:  Diagnosis Date  . Diabetes mellitus without complication (HCC)   . Heart murmur   . Hypertension   . 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 Osteoporosis   . Vertigo     Past Surgical History:  Procedure Laterality Date  . CHOLECYSTECTOMY    . COLONOSCOPY  2010 last  . ESOPHAGOGASTRODUODENOSCOPY N/A 08/08/2016   Procedure: ESOPHAGOGASTRODUODENOSCOPY (EGD);  Surgeon: Iva Boop, MD;  Location: [facility] ENDOSCOPY;  Service: Endoscopy;  Laterality: N/A;  . 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   . TUBAL LIGATION       Current Outpatient Prescriptions  Medication Sig Dispense Refill  . Calcium Carbonate-Vitamin D (CALCIUM 600+D) 600-200 MG-UNIT TABS Take 1 tablet by mouth daily.    . cholecalciferol (VITAMIN D) 1000 units tablet Take 5,000 Units by mouth daily.    . fexofenadine-pseudoephedrine (ALLEGRA-D) 60-120 MG 12 hr tablet Take 1 tablet by mouth every other day.    . fluticasone (FLONASE) 50 MCG/ACT nasal spray Place 1-2 sprays into both nostrils daily.    Marland Kitchen levothyroxine (SYNTHROID, LEVOTHROID) 100 MCG tablet Take 100 mcg by  mouth daily before breakfast.    . lisinopril (PRINIVIL,ZESTRIL) 10 MG tablet Take 10 mg by mouth daily.  0  . meclizine (ANTIVERT) 25 MG tablet Take 1 tablet (25 mg total) by mouth 3 (three) times daily as needed for dizziness. 30 tablet 0  . Multiple Vitamins-Minerals (ICAPS AREDS FORMULA PO) Take 1 tablet by mouth 2 (two) times daily.    . pantoprazole (PROTONIX) 40 MG tablet Take 1 tablet (40 mg total) by mouth 2 (two) times daily. 60 tablet 0   No current facility-administered medications for this visit.     Allergies:   Demerol [meperidine]    Social History:  The patient  reports that she has never smoked. She has never used smokeless tobacco. She reports that she does not drink alcohol or use drugs.   Family History:  The patient's family history includes Aortic stenosis in her brother; Breast cancer in her sister; Cancer in her sister; Kidney failure in her brother; Pneumonia in her father; Stroke in her mother.    ROS:  Please see the history of present illness.   Otherwise, review of systems are positive for none.   All other systems are reviewed and negative.    PHYSICAL EXAM: VS:  BP 132/60   Pulse 71   Ht  (1.6 m)   Wt 124 lb (56.2 kg)   BMI 21.97 kg/m  ,  BMI Body mass index is 21.97 kg/m. GEN: Well nourished, well developed, in no acute distress  HEENT: normal  Neck: no JVD, carotid bruits, or masses Cardiac: RRR; there is a gr 2/6 systolic ejection murmur RUSB. no rubs, or gallops,no edema  Respiratory:  clear to auscultation bilaterally, normal work of breathing GI: soft, nontender, nondistended, + BS MS: no deformity or atrophy  Skin: warm and dry, no rash Neuro:  Strength and sensation are intact Psych: euthymic mood, full affect   EKG:  EKG is ordered today. The ekg ordered today demonstrates NSR RBBB. I have personally reviewed and interpreted this study.    Recent Labs: 08/07/2016: B Natriuretic Peptide 67.6 08/08/2016: TSH 0.644 08/11/2016: ALT  13; BUN 13; Creatinine, Ser 0.80; Hemoglobin 8.5; Platelets 256; Potassium 4.3; Sodium 137    Lipid Panel No results found for: CHOL, TRIG, HDL, CHOLHDL, VLDL, LDLCALC, LDLDIRECT    Wt Readings from Last 3 Encounters:  01/16/17 124 lb (56.2 kg)  08/08/16 125 lb 14.4 oz (57.1 kg)  07/15/16 124 lb (56.2 kg)      Other studies Reviewed: Additional studies/ records that were reviewed today include:  Echo 02/22/16: Study Conclusions  - Left ventricle: The cavity size was normal. Wall thickness was   normal. Systolic function was normal. The estimated ejection   fraction was in the range of 60% to 65%. Wall motion was normal;   there were no regional wall motion abnormalities. Doppler   parameters are consistent with abnormal left ventricular   relaxation (grade 1 diastolic dysfunction). - Aortic valve: There was mild stenosis. There was mild   regurgitation. - Mitral valve: Calcified annulus. There was mild regurgitation   ASSESSMENT AND PLAN:  1.  Mild Aortic stenosis. Really unchanged from 2014 to 2017. She is asymptomatic. Risk is low. I explained her diagnosis with her. No action required at this time. I would like to see her back in one year for follow up. 2. RBBB.  3. HTN. I have recommended she stay away from decongestants. OK to use antihistamines and nasal steroids.   Current medicines are reviewed at length with the patient today.  The patient does not have concerns regarding medicines.  The following changes have been made:  no change  Labs/ tests ordered today include: none  Orders Placed This Encounter  Procedures  . EKG 12-Lead     Disposition:   FU with me in 1 year  Signed, Omauri Boeve [place], MD  01/16/2017 2:50 PM    [facility] [address], [place], [place], [address] Phone [phone], Fax [phone]

## 2017-01-16 ENCOUNTER — Encounter: Payer: Self-pay | Admitting: Cardiology

## 2017-01-16 ENCOUNTER — Ambulatory Visit (INDEPENDENT_AMBULATORY_CARE_PROVIDER_SITE_OTHER): Payer: Medicare Other | Admitting: Cardiology

## 2017-01-16 VITALS — BP 132/60 | HR 71 | Ht 63.0 in | Wt 124.0 lb

## 2017-01-16 DIAGNOSIS — I350 Nonrheumatic aortic (valve) stenosis: Secondary | ICD-10-CM | POA: Diagnosis not present

## 2017-01-16 DIAGNOSIS — I10 Essential (primary) hypertension: Secondary | ICD-10-CM | POA: Diagnosis not present

## 2017-01-16 NOTE — Patient Instructions (Signed)
Continue your current therapy but consider stopping the decongestant  I will see you in one year

## 2017-06-13 ENCOUNTER — Other Ambulatory Visit: Payer: Self-pay | Admitting: Internal Medicine

## 2017-06-13 DIAGNOSIS — Z1231 Encounter for screening mammogram for malignant neoplasm of breast: Secondary | ICD-10-CM

## 2017-07-29 ENCOUNTER — Ambulatory Visit
Admission: RE | Admit: 2017-07-29 | Discharge: 2017-07-29 | Disposition: A | Discharge status: Home / Self Care | Payer: Medicare Other | Source: Ambulatory Visit | Attending: Internal Medicine | Admitting: Internal Medicine

## 2017-07-29 DIAGNOSIS — Z1231 Encounter for screening mammogram for malignant neoplasm of breast: Secondary | ICD-10-CM

## 2017-11-05 IMAGING — CT CT CHEST W/ CM
2 of 5 series · 12 of 36 positions shown, 15 images · IV contrast (iopamidol)
Comparison: Chest radiograph 07/15/2016

CLINICAL DATA: Bloody emesis

EXAM:
CT CHEST, ABDOMEN, AND PELVIS WITH CONTRAST
TECHNIQUE: Multidetector CT imaging of the chest, abdomen and pelvis was
performed following the standard protocol during bolus
administration of intravenous contrast.
CONTRAST:  100mL [id number] IOPAMIDOL ([id number]) INJECTION 61%

[Series 4: cap with 5mm st · axial · 0.73mm/px · z∈[+859,+1299]mm · 9 of 112 slices shown, 12 images]
[im 12/112  mediastinal]
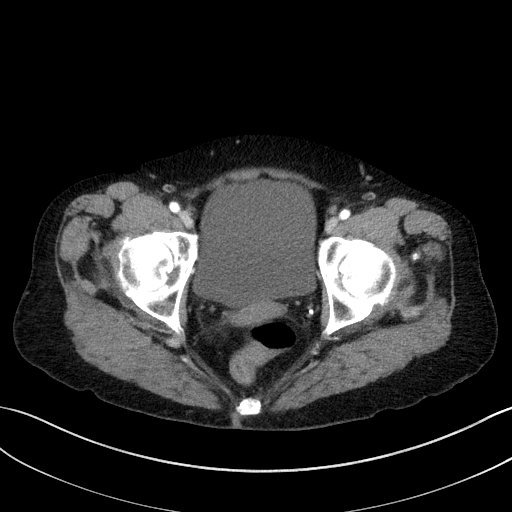
[im 12/112  lung]
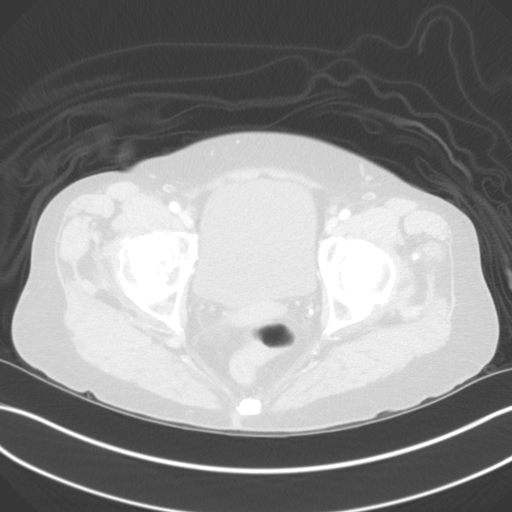
[im 23/112  lung]
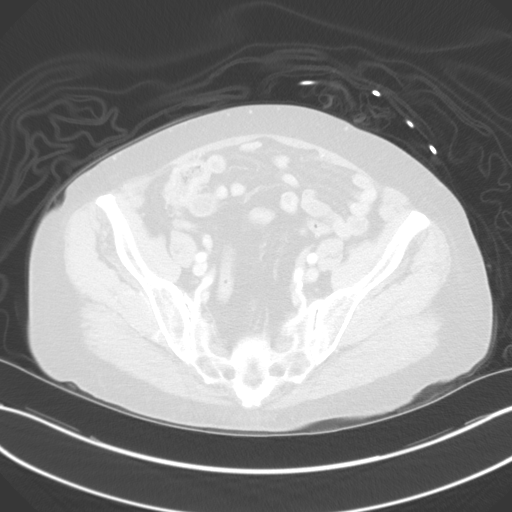
[im 34/112  lung]
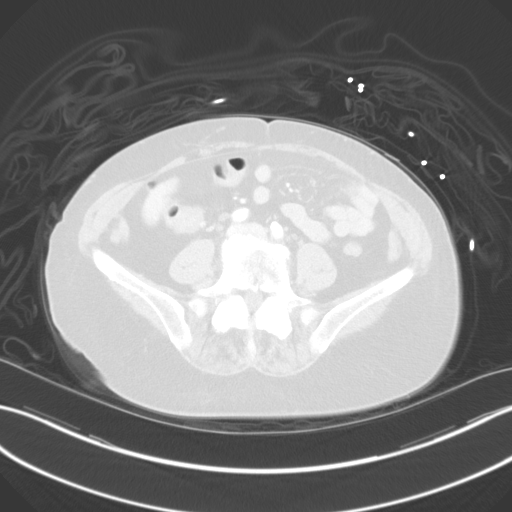
[im 45/112  lung]
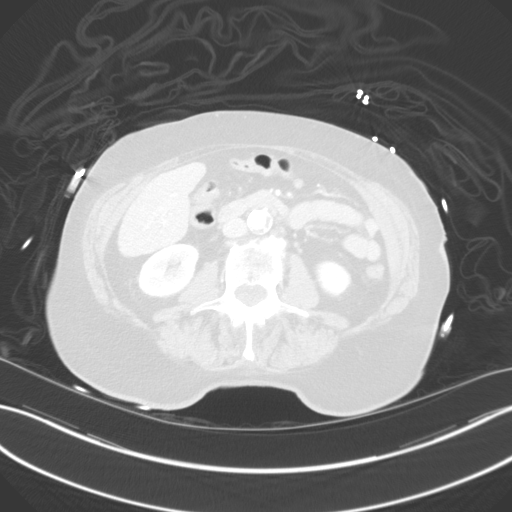
[im 56/112  mediastinal]
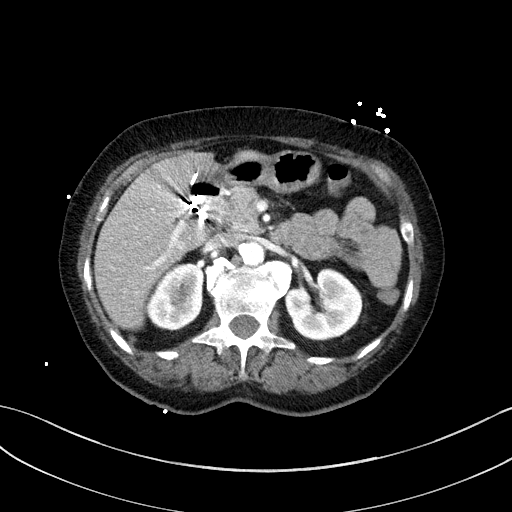
[im 56/112  lung]
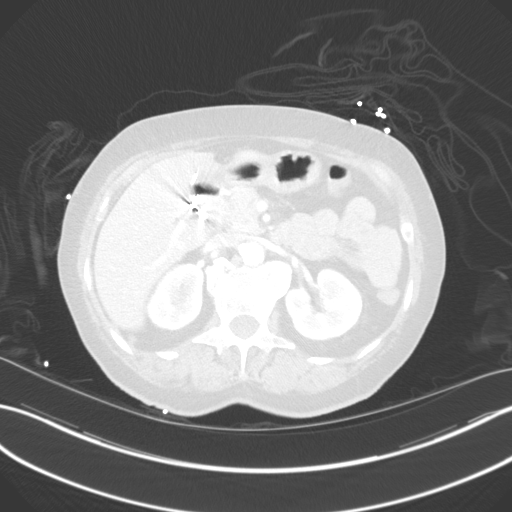
[im 67/112  lung]
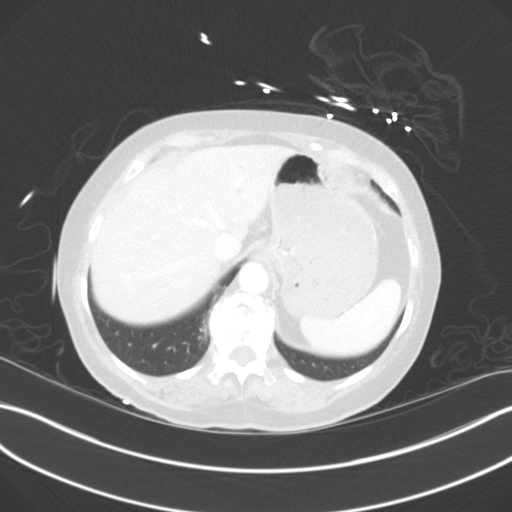
[im 78/112  lung]
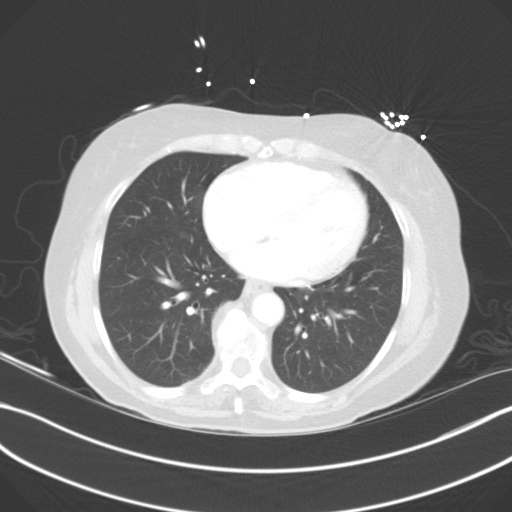
[im 89/112  lung]
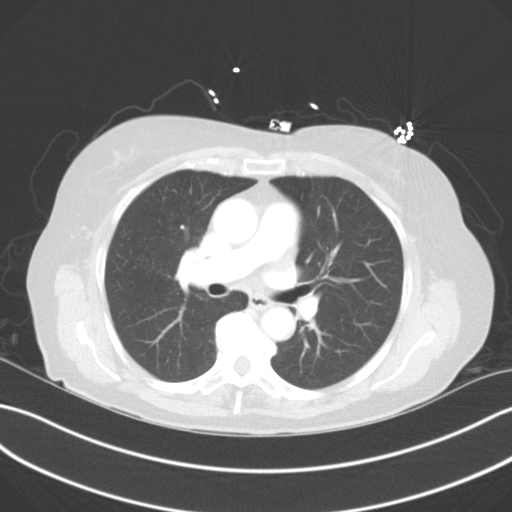
[im 100/112  mediastinal]
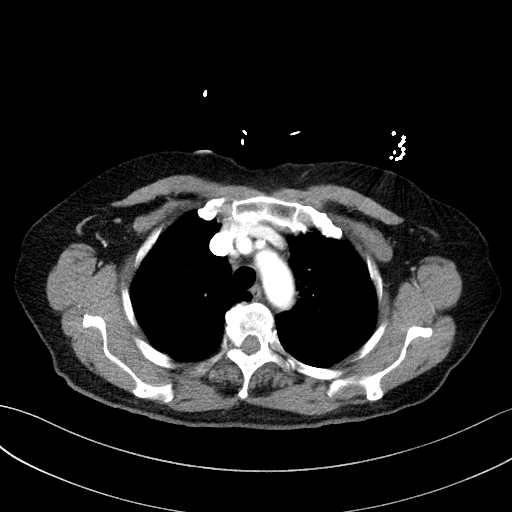
[im 100/112  lung]
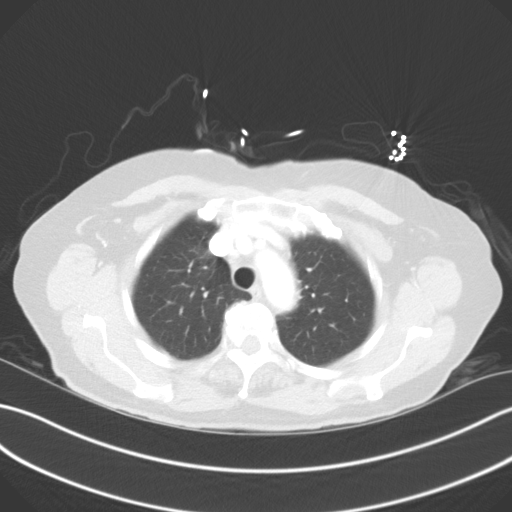

[Series 6: cap with 3mm st cor · coronal · 0.63mm/px · 3 of 149 slices shown]
[im 30/149  lung]
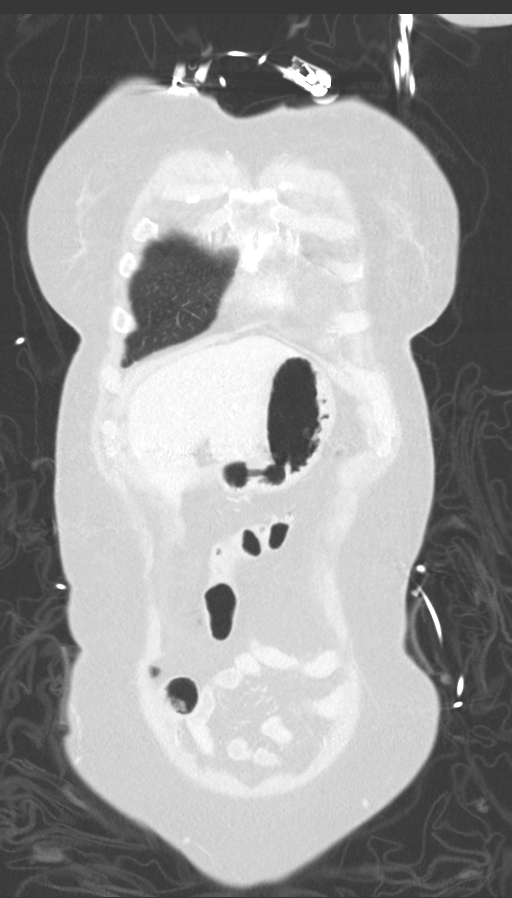
[im 60/149  lung]
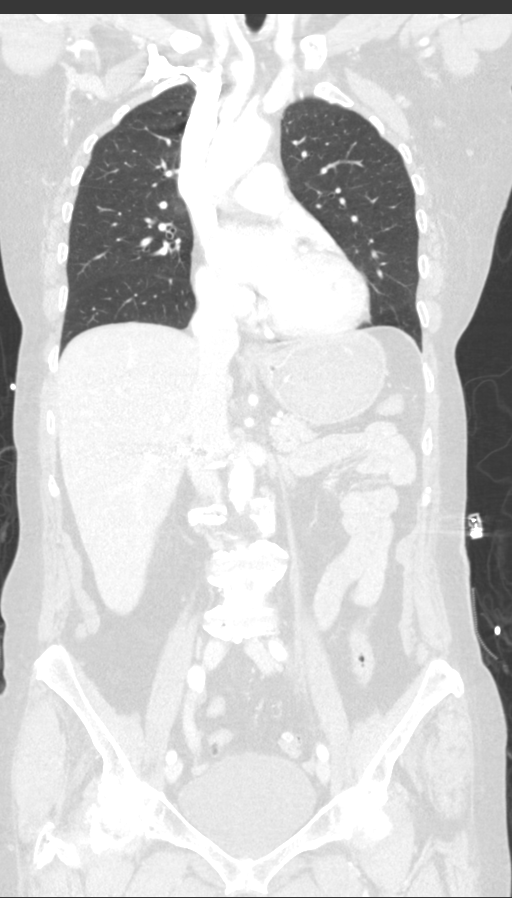
[im 89/149  lung]
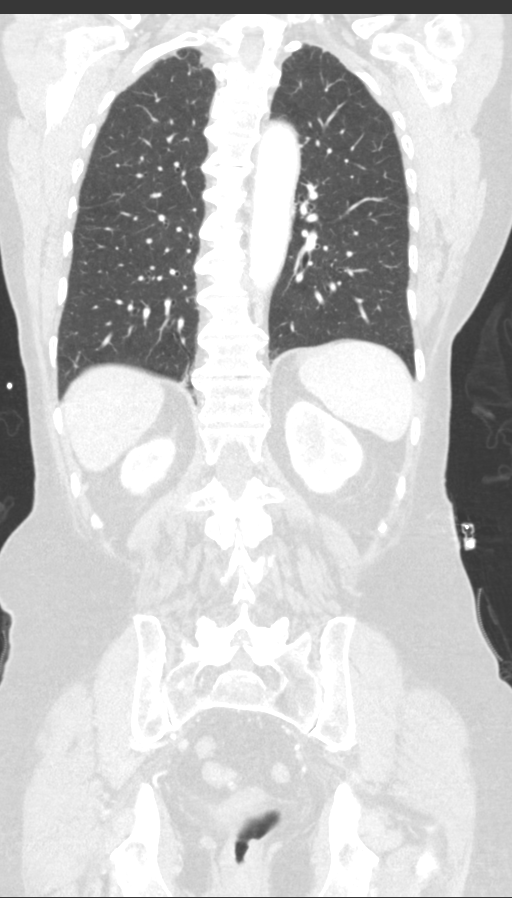

[12 of 36 positions shown; findings below may reference images not displayed]

FINDINGS: CT CHEST FINDINGS

Cardiovascular: No significant vascular findings. Normal heart size.
No pericardial effusion.

Mediastinum/Nodes: No mediastinal, hilar or axillary
lymphadenopathy. The visualized thyroid and thoracic esophageal
course are unremarkable.

Lungs/Pleura: Biapical scarring. No pleural effusion or
pneumothorax. 4 mm nodule at the right lung base posteriorly.

Musculoskeletal: Multilevel thoracic osteophytosis. No bony spinal
canal stenosis. No rib fracture.

CT ABDOMEN PELVIS FINDINGS

Hepatobiliary: Status post cholecystectomy. Hypoattenuation of the
liver compared to the spleen suggest hepatic steatosis. No biliary
dilatation or focal hepatic lesion.

Pancreas: Normal

Spleen: Normal

Adrenals/Urinary Tract: The adrenal glands are normal. There is no
hydronephrosis or nephrolithiasis. No perinephric stranding or solid
renal mass.

Stomach/Bowel: There is a curvilinear focus of hyperdensity of equal
attenuation to blood that courses from the gastroesophageal junction
into the gastric lumen. This has the appearance of a artery, but it
is not in the gastric wall or any solid viscus and therefore may be
image 110) the.

There is a small duodenal diverticulum. The remainder the duodenum
is normal. There is no small bowel obstruction or acute
inflammation. There is sigmoid diverticulosis without acute
inflammation. The colon is mostly decompressed. The appendix is
normal.

Vascular/Lymphatic: There is mixed calcified and noncalcified plaque
within the abdominal aorta. No abdominal or pelvic lymphadenopathy.

Reproductive: The uterus is normal. No adnexal mass. No free fluid
in the pelvis.

Other: None

Musculoskeletal: Multilevel lumbar osteophytosis and facet
arthrosis. No bony spinal canal stenosis.
IMPRESSION: 1. Serpiginous hyperdensity within the gastric lumen, which has the
appearance of a medium-sized blood vessel, but is not within the
gastric wall and is thus suspicious for active hemorrhage. This
could be Ethel Deroche lesion within the submucosa of the upper
gastric wall, actively bleeding into the stomach. Another
consideration would be a pedunculated gastric mass. Correlation with
upper endoscopy should be considered.
2. No other acute abnormality of the chest, abdomen or pelvis.
3. Aortic atherosclerosis.
4. 4 mm posterior right lung base nodule. No follow-up needed if
patient is low-risk. Non-contrast chest CT can be considered in 12
months if patient is high-risk. This recommendation follows the
consensus statement: Guidelines for Management of Incidental
Pulmonary Nodules Detected on CT Images: From the [HOSPITAL]

Critical Value/emergent results were called by telephone at the time
of interpretation on 08/07/2016 at [DATE] to Dr. ENRIQUE DELA ROSA , who
verbally acknowledged these results.

## 2018-02-08 NOTE — Progress Notes (Signed)
Cardiology Office Note   Date:  02/18/2018   ID:  Margaret Lucas, DOB 04-Mar-1935, MRN [id number]  PCP:  Irena Reichmann, DO  Cardiologist:   Peter Swaziland, MD   Chief Complaint  Patient presents with  . Aortic Stenosis      History of Present Illness: Margaret Lucas is a 83 y.o. female who is seen for follow up of aortic stenosis.  She has a history of HTN and DM- diet controlled. She mentions she has a murmur dating back 4-5 years. She had an Echo in 2014 showing some AV sclerosis. Repeat in 2017 showed mild AS but valve velocity was really unchanged.   On follow up today she has done well this past year. She denies any chest pain, dyspnea, palpitations, edema, orthopnea, dizziness or syncope. She is active and still drives. 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  She enjoys gardening. Notes her brother passed at 56 during surgery for AVR and aortic aneurysm.   Past Medical History:  Diagnosis Date  . Diabetes mellitus without complication (HCC)   . Heart murmur   . Hypertension   . 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 Osteoporosis   . Vertigo     Past Surgical History:  Procedure Laterality Date  . CHOLECYSTECTOMY    . COLONOSCOPY  2010 last  . ESOPHAGOGASTRODUODENOSCOPY N/A 08/08/2016   Procedure: ESOPHAGOGASTRODUODENOSCOPY (EGD);  Surgeon: Iva Boop, MD;  Location: [facility] ENDOSCOPY;  Service: Endoscopy;  Laterality: N/A;  . 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   . TUBAL LIGATION       Current Outpatient Medications  Medication Sig Dispense Refill  . Calcium Carbonate-Vitamin D (CALCIUM 600+D) 600-200 MG-UNIT TABS Take 1 tablet by mouth daily.    . cholecalciferol (VITAMIN D) 1000 units tablet Take 5,000 Units by mouth daily.    . fexofenadine-pseudoephedrine (ALLEGRA-D) 60-120 MG 12 hr tablet Take 1 tablet by mouth every other day.    . fluticasone (FLONASE) 50 MCG/ACT nasal spray Place 1-2 sprays into both  nostrils daily.    Marland Kitchen levothyroxine (SYNTHROID, LEVOTHROID) 100 MCG tablet Take 100 mcg by mouth daily before breakfast.    . lisinopril (PRINIVIL,ZESTRIL) 10 MG tablet Take 10 mg by mouth daily.  0  . meclizine (ANTIVERT) 25 MG tablet Take 1 tablet (25 mg total) by mouth 3 (three) times daily as needed for dizziness. 30 tablet 0  . Multiple Vitamins-Minerals (ICAPS AREDS FORMULA PO) Take 1 tablet by mouth 2 (two) times daily.    . pantoprazole (PROTONIX) 40 MG tablet Take 1 tablet (40 mg total) by mouth 2 (two) times daily. 60 tablet 0   No current facility-administered medications for this visit.     Allergies:   Demerol [meperidine]    Social History:  The patient  reports that she has never smoked. She has never used smokeless tobacco. She reports that she does not drink alcohol or use drugs.   Family History:  The patient's family history includes Aortic stenosis in her brother; Breast cancer in her sister; Cancer in her sister; Kidney failure in her brother; Pneumonia in her father; Stroke in her mother.    ROS:  Please see the history of present illness.   Otherwise, review of systems are positive for none.   All other systems are reviewed and negative.    PHYSICAL EXAM: VS:  BP (!) 142/56   Pulse 66   Ht 5'  3" (1.6 m)   Wt 126 lb 12.8 oz (57.5 kg)   BMI 22.46 kg/m  , BMI Body mass index is 22.46 kg/m. GENERAL:  Well appearing WF in NAD HEENT:  PERRL, EOMI, sclera are clear. Oropharynx is clear. NECK:  No jugular venous distention, carotid upstroke brisk and symmetric, no bruits, no thyromegaly or adenopathy LUNGS:  Clear to auscultation bilaterally CHEST:  Unremarkable HEART:  RRR,  PMI not displaced or sustained, grade 2/6 harsh systolic murmur RUSB. S1 and S2 within normal limits, no S3, no S4: no clicks, no rubs ABD:  Soft, nontender. BS +, no masses or bruits. No hepatomegaly, no splenomegaly EXT:  2 + pulses throughout, no edema, no cyanosis no clubbing SKIN:  Warm  and dry.  No rashes NEURO:  Alert and oriented x 3. Cranial nerves II through XII intact. PSYCH:  Cognitively intact     EKG:  EKG is ordered today. The ekg ordered today demonstrates NSR RBBB. I have personally reviewed and interpreted this study.    Recent Labs: No results found for requested labs within last 8760 hours.    Lipid Panel No results found for: CHOL, TRIG, HDL, CHOLHDL, VLDL, LDLCALC, LDLDIRECT   Labs dated 06/03/17: cholesterol 232, triglycerides 158, HDL 47, LDL 153. A1c 6.2%. Potassium 5.5. Creatinine 0.74   Wt Readings from Last 3 Encounters:  02/18/18 126 lb 12.8 oz (57.5 kg)  01/16/17 124 lb (56.2 kg)  08/08/16 125 lb 14.4 oz (57.1 kg)      Other studies Reviewed: Additional studies/ records that were reviewed today include:  Echo 02/22/16: Study Conclusions  - Left ventricle: The cavity size was normal. Wall thickness was   normal. Systolic function was normal. The estimated ejection   fraction was in the range of 60% to 65%. Wall motion was normal;   there were no regional wall motion abnormalities. Doppler   parameters are consistent with abnormal left ventricular   relaxation (grade 1 diastolic dysfunction). - Aortic valve: There was mild stenosis. There was mild   regurgitation. - Mitral valve: Calcified annulus. There was mild regurgitation   ASSESSMENT AND PLAN:  1.  Mild Aortic stenosis. Really unchanged from 2014 to 2017. She is asymptomatic. Exam is stable today. Risk is low. Plan to repeat Echo in a couple of years.  I would like to see her back in one year for follow up. 2. RBBB.  3. HTN. Controlled on lisinopril. 4. Hypercholesterolemia. Managed with diet.    Current medicines are reviewed at length with the patient today.  The patient does not have concerns regarding medicines.  The following changes have been made:  no change  Labs/ tests ordered today include: none  No orders of the defined types were placed in this  encounter.    Disposition:   FU with me in 1 year  Signed, Peter Swaziland, MD  02/18/2018 2:18 PM    [facility] [address] [place], [place], [address] Phone [phone], Fax [phone]

## 2018-02-18 ENCOUNTER — Encounter: Payer: Self-pay | Admitting: Cardiology

## 2018-02-18 ENCOUNTER — Ambulatory Visit: Payer: Medicare Other | Admitting: Cardiology

## 2018-02-18 VITALS — BP 142/56 | HR 66 | Ht 63.0 in | Wt 126.8 lb

## 2018-02-18 DIAGNOSIS — I350 Nonrheumatic aortic (valve) stenosis: Secondary | ICD-10-CM

## 2018-02-18 DIAGNOSIS — I10 Essential (primary) hypertension: Secondary | ICD-10-CM | POA: Diagnosis not present

## 2018-02-18 NOTE — Patient Instructions (Signed)
Continue your current therapy  I will see you in one year   

## 2018-09-03 ENCOUNTER — Other Ambulatory Visit: Payer: Self-pay | Admitting: Family Medicine

## 2018-09-03 DIAGNOSIS — Z1231 Encounter for screening mammogram for malignant neoplasm of breast: Secondary | ICD-10-CM

## 2018-10-23 ENCOUNTER — Ambulatory Visit: Payer: Medicare Other

## 2019-02-20 NOTE — Progress Notes (Deleted)
Cardiology Office Note   Date:  02/20/2019   ID:  Margaret Lucas, DOB 05/21/1934, MRN [id number]  PCP:  Janie Morning, DO  Cardiologist:   Peter Martinique, MD   No chief complaint on file.     History of Present Illness: Margaret Lucas is a 84 y.o. female who is seen for follow up of aortic stenosis.  She has a history of HTN and DM- diet controlled. She mentions she has a murmur dating back 4-5 years. She had an Echo in 2014 showing some AV sclerosis. Repeat in 2017 showed mild AS but valve velocity was really unchanged.   On follow up today she has done well this past year. She denies any chest pain, dyspnea, palpitations, edema, orthopnea, dizziness or syncope. She is active and still drives. 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  She enjoys gardening. Notes her brother passed at 49 during surgery for AVR and aortic aneurysm.   Past Medical History:  Diagnosis Date  . Diabetes mellitus without complication ([place])   . Heart murmur   . Hypertension   . 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 Osteoporosis   . Vertigo     Past Surgical History:  Procedure Laterality Date  . CHOLECYSTECTOMY    . COLONOSCOPY  2010 last  . ESOPHAGOGASTRODUODENOSCOPY N/A 08/08/2016   Procedure: ESOPHAGOGASTRODUODENOSCOPY (EGD);  Surgeon: Gatha Mayer, MD;  Location[facility] ENDOSCOPY;  Service: Endoscopy;  Laterality: N/A;  . 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   . TUBAL LIGATION       Current Outpatient Medications  Medication Sig Dispense Refill  . Calcium Carbonate-Vitamin D (CALCIUM 600+D) 600-200 MG-UNIT TABS Take 1 tablet by mouth daily.    . cholecalciferol (VITAMIN D) 1000 units tablet Take 5,000 Units by mouth daily.    . fexofenadine-pseudoephedrine (ALLEGRA-D) 60-120 MG 12 hr tablet Take 1 tablet by mouth every other day.    . fluticasone (FLONASE) 50 MCG/ACT nasal spray Place 1-2 sprays into both nostrils daily.    Marland Kitchen  levothyroxine (SYNTHROID, LEVOTHROID) 100 MCG tablet Take 100 mcg by mouth daily before breakfast.    . lisinopril (PRINIVIL,ZESTRIL) 10 MG tablet Take 10 mg by mouth daily.  0  . meclizine (ANTIVERT) 25 MG tablet Take 1 tablet (25 mg total) by mouth 3 (three) times daily as needed for dizziness. 30 tablet 0  . Multiple Vitamins-Minerals (ICAPS AREDS FORMULA PO) Take 1 tablet by mouth 2 (two) times daily.    . pantoprazole (PROTONIX) 40 MG tablet Take 1 tablet (40 mg total) by mouth 2 (two) times daily. 60 tablet 0   No current facility-administered medications for this visit.     Allergies:   Demerol [meperidine]    Social History:  The patient  reports that she has never smoked. She has never used smokeless tobacco. She reports that she does not drink alcohol or use drugs.   Family History:  The patient's family history includes Aortic stenosis in her brother; Breast cancer in her sister; Cancer in her sister; Kidney failure in her brother; Pneumonia in her father; Stroke in her mother.    ROS:  Please see the history of present illness.   Otherwise, review of systems are positive for none.   All other systems are reviewed and negative.    PHYSICAL EXAM: VS:  There were no vitals taken for this visit. , BMI There is no height or weight on  file to calculate BMI. GENERAL:  Well appearing WF in NAD HEENT:  PERRL, EOMI, sclera are clear. Oropharynx is clear. NECK:  No jugular venous distention, carotid upstroke brisk and symmetric, no bruits, no thyromegaly or adenopathy LUNGS:  Clear to auscultation bilaterally CHEST:  Unremarkable HEART:  RRR,  PMI not displaced or sustained, grade 2/6 harsh systolic murmur RUSB. S1 and S2 within normal limits, no S3, no S4: no clicks, no rubs ABD:  Soft, nontender. BS +, no masses or bruits. No hepatomegaly, no splenomegaly EXT:  2 + pulses throughout, no edema, no cyanosis no clubbing SKIN:  Warm and dry.  No rashes NEURO:  Alert and oriented x 3.  Cranial nerves II through XII intact. PSYCH:  Cognitively intact     EKG:  EKG is ordered today. The ekg ordered today demonstrates NSR RBBB. I have personally reviewed and interpreted this study.    Recent Labs: No results found for requested labs within last 8760 hours.    Lipid Panel No results found for: CHOL, TRIG, HDL, CHOLHDL, VLDL, LDLCALC, LDLDIRECT   Labs dated 06/03/17: cholesterol 232, triglycerides 158, HDL 47, LDL 153. A1c 6.2%. Potassium 5.5. Creatinine 0.74 Dated 05/20/18: cholesterol 218, triglycerides 101, HDL 54, LDL 140. Chemistries normal.   Wt Readings from Last 3 Encounters:  02/18/18 126 lb 12.8 oz (57.5 kg)  01/16/17 124 lb (56.2 kg)  08/08/16 125 lb 14.4 oz (57.1 kg)      Other studies Reviewed: Additional studies/ records that were reviewed today include:  Echo 02/22/16: Study Conclusions  - Left ventricle: The cavity size was normal. Wall thickness was   normal. Systolic function was normal. The estimated ejection   fraction was in the range of 60% to 65%. Wall motion was normal;   there were no regional wall motion abnormalities. Doppler   parameters are consistent with abnormal left ventricular   relaxation (grade 1 diastolic dysfunction). - Aortic valve: There was mild stenosis. There was mild   regurgitation. - Mitral valve: Calcified annulus. There was mild regurgitation   ASSESSMENT AND PLAN:  1.  Mild Aortic stenosis. Really unchanged from 2014 to 2017. She is asymptomatic. Exam is stable today. Risk is low. Plan to repeat Echo in a couple of years.  I would like to see her back in one year for follow up. 2. RBBB.  3. HTN. Controlled on lisinopril. 4. Hypercholesterolemia. Managed with diet.    Current medicines are reviewed at length with the patient today.  The patient does not have concerns regarding medicines.  The following changes have been made:  no change  Labs/ tests ordered today include: none  No orders of the defined  types were placed in this encounter.    Disposition:   FU with me in 1 year  Signed, Peter Swaziland, MD  02/20/2019 7:47 AM    [facility] [address], [place], [place], [address] Phone [phone], Fax [phone]

## 2019-02-24 ENCOUNTER — Other Ambulatory Visit: Payer: Self-pay

## 2019-02-24 DIAGNOSIS — Z20822 Contact with and (suspected) exposure to covid-19: Secondary | ICD-10-CM

## 2019-02-25 ENCOUNTER — Ambulatory Visit: Payer: Medicare Other | Admitting: Cardiology

## 2019-02-25 LAB — NOVEL CORONAVIRUS, NAA: SARS-CoV-2, NAA: NOT DETECTED

## 2019-03-27 ENCOUNTER — Ambulatory Visit: Payer: Medicare Other | Admitting: Physician Assistant

## 2019-04-21 DIAGNOSIS — H353231 Exudative age-related macular degeneration, bilateral, with active choroidal neovascularization: Secondary | ICD-10-CM | POA: Diagnosis not present

## 2019-04-28 NOTE — Progress Notes (Signed)
Cardiology Office Note   Date:  05/04/2019   ID:  Margaret Lucas, DOB Dec 19, 1934, MRN [id number]  PCP:  Irena Reichmann, DO  Cardiologist:   Aylana Hirschfeld Swaziland, MD   Chief Complaint  Patient presents with  . Aortic Stenosis      History of Present Illness: Margaret Lucas is a 85 y.o. female who is seen for follow up of aortic stenosis.  She has a history of HTN and DM- diet controlled. She mentions she has a murmur dating back 4-5 years. She had an Echo in 2014 showing some AV sclerosis. Repeat in 2017 showed mild AS but valve velocity was really unchanged.   On follow up today she has done well this past year. She denies any chest pain, dyspnea, palpitations, edema, orthopnea, dizziness or syncope. She is active and still drives.  She enjoys gardening. Notes her brother passed at 29 during surgery for AVR and aortic aneurysm.   Past Medical History:  Diagnosis Date  . Diabetes mellitus without complication (HCC)   . Heart murmur   . Hypertension   . 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 Osteoporosis   . Vertigo     Past Surgical History:  Procedure Laterality Date  . CHOLECYSTECTOMY    . COLONOSCOPY  2010 last  . ESOPHAGOGASTRODUODENOSCOPY N/A 08/08/2016   Procedure: ESOPHAGOGASTRODUODENOSCOPY (EGD);  Surgeon: Iva Boop, MD;  Location: [facility] ENDOSCOPY;  Service: Endoscopy;  Laterality: N/A;  . 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   . TUBAL LIGATION       Current Outpatient Medications  Medication Sig Dispense Refill  . Calcium Carbonate-Vitamin D (CALCIUM 600+D) 600-200 MG-UNIT TABS Take 1 tablet by mouth daily.    . cholecalciferol (VITAMIN D) 1000 units tablet Take 5,000 Units by mouth daily.    . fluticasone (FLONASE) 50 MCG/ACT nasal spray Place 1-2 sprays into both nostrils daily.    Marland Kitchen levothyroxine (SYNTHROID, LEVOTHROID) 100 MCG tablet Take 100 mcg by mouth daily before breakfast.    . lisinopril  (PRINIVIL,ZESTRIL) 10 MG tablet Take 10 mg by mouth daily.  0  . meclizine (ANTIVERT) 25 MG tablet Take 1 tablet (25 mg total) by mouth 3 (three) times daily as needed for dizziness. 30 tablet 0  . Multiple Vitamins-Minerals (ICAPS AREDS FORMULA PO) Take 1 tablet by mouth 2 (two) times daily.    . pantoprazole (PROTONIX) 40 MG tablet Take 1 tablet (40 mg total) by mouth 2 (two) times daily. 60 tablet 0   No current facility-administered medications for this visit.    Allergies:   Demerol [meperidine]    Social History:  The patient  reports that she has never smoked. She has never used smokeless tobacco. She reports that she does not drink alcohol or use drugs.   Family History:  The patient's family history includes Aortic stenosis in her brother; Breast cancer in her sister; Cancer in her sister; Kidney failure in her brother; Pneumonia in her father; Stroke in her mother.    ROS:  Please see the history of present illness.   Otherwise, review of systems are positive for none.   All other systems are reviewed and negative.    PHYSICAL EXAM: VS:  BP (!) 148/70 (BP Location: Right Arm, Cuff Size: Normal)   Pulse 64   Ht 5\' 3"  (1.6 m)   Wt 127 lb 9.6 oz (57.9 kg)   SpO2 99%   BMI  22.60 kg/m  , BMI Body mass index is 22.6 kg/m. GENERAL:  Well appearing WF in NAD HEENT:  PERRL, EOMI, sclera are clear. Oropharynx is clear. NECK:  No jugular venous distention, carotid upstroke brisk and symmetric, no bruits, no thyromegaly or adenopathy LUNGS:  Clear to auscultation bilaterally CHEST:  Unremarkable HEART:  RRR,  PMI not displaced or sustained, grade 2/6 harsh systolic murmur RUSB. S1 and S2 within normal limits, no S3, no S4: no clicks, no rubs ABD:  Soft, nontender. BS +, no masses or bruits. No hepatomegaly, no splenomegaly EXT:  2 + pulses throughout, no edema, no cyanosis no clubbing SKIN:  Warm and dry.  No rashes NEURO:  Alert and oriented x 3. Cranial nerves II through XII  intact. PSYCH:  Cognitively intact     EKG:  EKG is ordered today. The ekg ordered today demonstrates NSR RBBB. I have personally reviewed and interpreted this study.    Recent Labs: No results found for requested labs within last 8760 hours.    Lipid Panel No results found for: CHOL, TRIG, HDL, CHOLHDL, VLDL, LDLCALC, LDLDIRECT   Labs dated 06/03/17: cholesterol 232, triglycerides 158, HDL 47, LDL 153. A1c 6.2%. Potassium 5.5. Creatinine 0.74 Dated 05/20/18: cholesterol 218, triglycerides 121, HDL 54, LDL 140. CMET and TSH normal.  Wt Readings from Last 3 Encounters:  05/04/19 127 lb 9.6 oz (57.9 kg)  02/18/18 126 lb 12.8 oz (57.5 kg)  01/16/17 124 lb (56.2 kg)      Other studies Reviewed: Additional studies/ records that were reviewed today include:  Echo 02/22/16: Study Conclusions  - Left ventricle: The cavity size was normal. Wall thickness was   normal. Systolic function was normal. The estimated ejection   fraction was in the range of 60% to 65%. Wall motion was normal;   there were no regional wall motion abnormalities. Doppler   parameters are consistent with abnormal left ventricular   relaxation (grade 1 diastolic dysfunction). - Aortic valve: There was mild stenosis. There was mild   regurgitation. - Mitral valve: Calcified annulus. There was mild regurgitation   ASSESSMENT AND PLAN:  1.  Mild Aortic stenosis. Her exam is unchanged from prior. Echo in 2017 showed only mild stenosis. She is asymptomatic. Risk is low. Plan to repeat Echo in a year or so.   I would like to see her back in one year for follow up. 2. RBBB.  3. HTN. Elevated today. I asked that she repeat BP at home and she has follow up with primary care in a couple of weeks. If BP still running high could increase lisinopril.  4. Hypercholesterolemia. Managed with diet.    Current medicines are reviewed at length with the patient today.  The patient does not have concerns regarding  medicines.  The following changes have been made:  no change  Labs/ tests ordered today include: none  No orders of the defined types were placed in this encounter.    Disposition:   FU with me in 1 year  Signed, Kaimen Peine Martinique, MD  05/04/2019 11:57 AM    [facility] [address], [place], [place], [address] Phone [phone], Fax [phone]

## 2019-05-04 ENCOUNTER — Ambulatory Visit: Payer: Medicare PPO | Admitting: Cardiology

## 2019-05-04 ENCOUNTER — Encounter: Payer: Self-pay | Admitting: Cardiology

## 2019-05-04 ENCOUNTER — Other Ambulatory Visit: Payer: Self-pay

## 2019-05-04 VITALS — BP 148/70 | HR 64 | Ht 63.0 in | Wt 127.6 lb

## 2019-05-04 DIAGNOSIS — I10 Essential (primary) hypertension: Secondary | ICD-10-CM

## 2019-05-04 DIAGNOSIS — I350 Nonrheumatic aortic (valve) stenosis: Secondary | ICD-10-CM

## 2019-05-04 NOTE — Patient Instructions (Signed)
Medication Instructions:  NO CHANGES *If you need a refill on your cardiac medications before your next appointment, please call your pharmacy*  Lab Work: NONE If you have labs (blood work) drawn today and your tests are completely normal, you will receive your results only by: Marland Kitchen MyChart Message (if you have MyChart) OR . A paper copy in the mail If you have any lab test that is abnormal or we need to change your treatment, we will call you to review the results.    Follow-Up: At [facility], you and your health needs are our priority.  As part of our continuing mission to provide you with exceptional heart care, we have created designated Provider Care Teams.  These Care Teams include your primary Cardiologist (physician) and Advanced Practice Providers (APPs -  Physician Assistants and Nurse Practitioners) who all work together to provide you with the care you need, when you need it.  Your next appointment:   12 month(s)  The format for your next appointment:   In Person  Provider:   Peter Swaziland, MD  Other Instructions CALL IN September TO SCHEDULE YOUR APPOINTMENT FOR January

## 2019-05-14 ENCOUNTER — Ambulatory Visit: Payer: Medicare Other

## 2019-05-18 DIAGNOSIS — E785 Hyperlipidemia, unspecified: Secondary | ICD-10-CM | POA: Diagnosis not present

## 2019-05-18 DIAGNOSIS — E1122 Type 2 diabetes mellitus with diabetic chronic kidney disease: Secondary | ICD-10-CM | POA: Diagnosis not present

## 2019-05-18 DIAGNOSIS — E039 Hypothyroidism, unspecified: Secondary | ICD-10-CM | POA: Diagnosis not present

## 2019-05-18 DIAGNOSIS — I5032 Chronic diastolic (congestive) heart failure: Secondary | ICD-10-CM | POA: Diagnosis not present

## 2019-05-25 DIAGNOSIS — E039 Hypothyroidism, unspecified: Secondary | ICD-10-CM | POA: Diagnosis not present

## 2019-05-25 DIAGNOSIS — Z7189 Other specified counseling: Secondary | ICD-10-CM | POA: Diagnosis not present

## 2019-05-25 DIAGNOSIS — I350 Nonrheumatic aortic (valve) stenosis: Secondary | ICD-10-CM | POA: Diagnosis not present

## 2019-05-25 DIAGNOSIS — N183 Chronic kidney disease, stage 3 unspecified: Secondary | ICD-10-CM | POA: Diagnosis not present

## 2019-05-25 DIAGNOSIS — I5032 Chronic diastolic (congestive) heart failure: Secondary | ICD-10-CM | POA: Diagnosis not present

## 2019-05-25 DIAGNOSIS — E119 Type 2 diabetes mellitus without complications: Secondary | ICD-10-CM | POA: Diagnosis not present

## 2019-05-25 DIAGNOSIS — H8110 Benign paroxysmal vertigo, unspecified ear: Secondary | ICD-10-CM | POA: Diagnosis not present

## 2019-05-25 DIAGNOSIS — I10 Essential (primary) hypertension: Secondary | ICD-10-CM | POA: Diagnosis not present

## 2019-05-26 DIAGNOSIS — H353231 Exudative age-related macular degeneration, bilateral, with active choroidal neovascularization: Secondary | ICD-10-CM | POA: Diagnosis not present

## 2019-06-10 DIAGNOSIS — J309 Allergic rhinitis, unspecified: Secondary | ICD-10-CM | POA: Diagnosis not present

## 2019-06-10 DIAGNOSIS — N183 Chronic kidney disease, stage 3 unspecified: Secondary | ICD-10-CM | POA: Diagnosis not present

## 2019-06-10 DIAGNOSIS — E1122 Type 2 diabetes mellitus with diabetic chronic kidney disease: Secondary | ICD-10-CM | POA: Diagnosis not present

## 2019-06-10 DIAGNOSIS — I10 Essential (primary) hypertension: Secondary | ICD-10-CM | POA: Diagnosis not present

## 2019-06-10 DIAGNOSIS — I350 Nonrheumatic aortic (valve) stenosis: Secondary | ICD-10-CM | POA: Diagnosis not present

## 2019-06-10 DIAGNOSIS — Z8719 Personal history of other diseases of the digestive system: Secondary | ICD-10-CM | POA: Diagnosis not present

## 2019-06-10 DIAGNOSIS — K219 Gastro-esophageal reflux disease without esophagitis: Secondary | ICD-10-CM | POA: Diagnosis not present

## 2019-06-10 DIAGNOSIS — Z0000 Encounter for general adult medical examination without abnormal findings: Secondary | ICD-10-CM | POA: Diagnosis not present

## 2019-06-10 DIAGNOSIS — M810 Age-related osteoporosis without current pathological fracture: Secondary | ICD-10-CM | POA: Diagnosis not present

## 2019-06-30 DIAGNOSIS — H353231 Exudative age-related macular degeneration, bilateral, with active choroidal neovascularization: Secondary | ICD-10-CM | POA: Diagnosis not present

## 2019-07-16 ENCOUNTER — Ambulatory Visit
Admission: RE | Admit: 2019-07-16 | Discharge: 2019-07-16 | Disposition: A | Discharge status: Home / Self Care | Payer: Medicare PPO | Source: Ambulatory Visit | Attending: Family Medicine | Admitting: Family Medicine

## 2019-07-16 ENCOUNTER — Other Ambulatory Visit: Payer: Self-pay

## 2019-07-16 DIAGNOSIS — Z1231 Encounter for screening mammogram for malignant neoplasm of breast: Secondary | ICD-10-CM | POA: Diagnosis not present

## 2019-08-05 DIAGNOSIS — H353231 Exudative age-related macular degeneration, bilateral, with active choroidal neovascularization: Secondary | ICD-10-CM | POA: Diagnosis not present

## 2019-09-09 DIAGNOSIS — H353231 Exudative age-related macular degeneration, bilateral, with active choroidal neovascularization: Secondary | ICD-10-CM | POA: Diagnosis not present

## 2019-09-24 DIAGNOSIS — Z08 Encounter for follow-up examination after completed treatment for malignant neoplasm: Secondary | ICD-10-CM | POA: Diagnosis not present

## 2019-09-24 DIAGNOSIS — Z85828 Personal history of other malignant neoplasm of skin: Secondary | ICD-10-CM | POA: Diagnosis not present

## 2019-09-24 DIAGNOSIS — L570 Actinic keratosis: Secondary | ICD-10-CM | POA: Diagnosis not present

## 2019-09-24 DIAGNOSIS — X32XXXD Exposure to sunlight, subsequent encounter: Secondary | ICD-10-CM | POA: Diagnosis not present

## 2019-09-30 DIAGNOSIS — E1122 Type 2 diabetes mellitus with diabetic chronic kidney disease: Secondary | ICD-10-CM | POA: Diagnosis not present

## 2019-09-30 DIAGNOSIS — E039 Hypothyroidism, unspecified: Secondary | ICD-10-CM | POA: Diagnosis not present

## 2019-09-30 DIAGNOSIS — N183 Chronic kidney disease, stage 3 unspecified: Secondary | ICD-10-CM | POA: Diagnosis not present

## 2019-09-30 DIAGNOSIS — Z8719 Personal history of other diseases of the digestive system: Secondary | ICD-10-CM | POA: Diagnosis not present

## 2019-09-30 DIAGNOSIS — I10 Essential (primary) hypertension: Secondary | ICD-10-CM | POA: Diagnosis not present

## 2019-09-30 DIAGNOSIS — K219 Gastro-esophageal reflux disease without esophagitis: Secondary | ICD-10-CM | POA: Diagnosis not present

## 2019-10-07 DIAGNOSIS — H353231 Exudative age-related macular degeneration, bilateral, with active choroidal neovascularization: Secondary | ICD-10-CM | POA: Diagnosis not present

## 2019-10-13 DIAGNOSIS — I10 Essential (primary) hypertension: Secondary | ICD-10-CM | POA: Diagnosis not present

## 2019-10-13 DIAGNOSIS — Z8719 Personal history of other diseases of the digestive system: Secondary | ICD-10-CM | POA: Diagnosis not present

## 2019-10-13 DIAGNOSIS — E1122 Type 2 diabetes mellitus with diabetic chronic kidney disease: Secondary | ICD-10-CM | POA: Diagnosis not present

## 2019-10-13 DIAGNOSIS — E039 Hypothyroidism, unspecified: Secondary | ICD-10-CM | POA: Diagnosis not present

## 2019-11-11 DIAGNOSIS — H353231 Exudative age-related macular degeneration, bilateral, with active choroidal neovascularization: Secondary | ICD-10-CM | POA: Diagnosis not present

## 2019-12-10 DIAGNOSIS — R109 Unspecified abdominal pain: Secondary | ICD-10-CM | POA: Diagnosis not present

## 2019-12-23 DIAGNOSIS — H353231 Exudative age-related macular degeneration, bilateral, with active choroidal neovascularization: Secondary | ICD-10-CM | POA: Diagnosis not present

## 2020-02-04 DIAGNOSIS — H353231 Exudative age-related macular degeneration, bilateral, with active choroidal neovascularization: Secondary | ICD-10-CM | POA: Diagnosis not present

## 2020-02-09 DIAGNOSIS — I10 Essential (primary) hypertension: Secondary | ICD-10-CM | POA: Diagnosis not present

## 2020-02-09 DIAGNOSIS — R001 Bradycardia, unspecified: Secondary | ICD-10-CM | POA: Diagnosis not present

## 2020-02-12 DIAGNOSIS — Z79899 Other long term (current) drug therapy: Secondary | ICD-10-CM

## 2020-02-13 NOTE — Telephone Encounter (Signed)
I would recommend adding chlorthalidone 25 mg daily. Repeat BMET in a week  Rashel Okeefe Sw[place]and MD, [facility]

## 2020-02-15 MED ORDER — CHLORTHALIDONE 25 MG PO TABS: 25.0000 mg | ORAL_TABLET | Freq: Every day | ORAL | 3 refills | Status: DC

## 2020-02-23 ENCOUNTER — Emergency Department (HOSPITAL_BASED_OUTPATIENT_CLINIC_OR_DEPARTMENT_OTHER): Payer: Medicare PPO

## 2020-02-23 ENCOUNTER — Other Ambulatory Visit: Payer: Self-pay

## 2020-02-23 ENCOUNTER — Encounter (HOSPITAL_BASED_OUTPATIENT_CLINIC_OR_DEPARTMENT_OTHER): Payer: Self-pay | Admitting: *Deleted

## 2020-02-23 ENCOUNTER — Inpatient Hospital Stay (HOSPITAL_BASED_OUTPATIENT_CLINIC_OR_DEPARTMENT_OTHER)
Admission: EM | Admit: 2020-02-23 | Discharge: 2020-02-26 | DRG: 641 | Disposition: A | Discharge status: Home / Self Care | Payer: Medicare PPO | Attending: Internal Medicine | Admitting: Internal Medicine

## 2020-02-23 DIAGNOSIS — Z823 Family history of stroke: Secondary | ICD-10-CM

## 2020-02-23 DIAGNOSIS — I4510 Unspecified right bundle-branch block: Secondary | ICD-10-CM | POA: Diagnosis present

## 2020-02-23 DIAGNOSIS — R718 Other abnormality of red blood cells: Secondary | ICD-10-CM | POA: Diagnosis present

## 2020-02-23 DIAGNOSIS — I10 Essential (primary) hypertension: Secondary | ICD-10-CM | POA: Diagnosis present

## 2020-02-23 DIAGNOSIS — R1110 Vomiting, unspecified: Secondary | ICD-10-CM | POA: Diagnosis not present

## 2020-02-23 DIAGNOSIS — Z20822 Contact with and (suspected) exposure to covid-19: Secondary | ICD-10-CM | POA: Diagnosis present

## 2020-02-23 DIAGNOSIS — R112 Nausea with vomiting, unspecified: Secondary | ICD-10-CM | POA: Diagnosis not present

## 2020-02-23 DIAGNOSIS — R42 Dizziness and giddiness: Secondary | ICD-10-CM | POA: Diagnosis not present

## 2020-02-23 DIAGNOSIS — N10 Acute tubulo-interstitial nephritis: Secondary | ICD-10-CM

## 2020-02-23 DIAGNOSIS — E860 Dehydration: Secondary | ICD-10-CM | POA: Diagnosis present

## 2020-02-23 DIAGNOSIS — Z87440 Personal history of urinary (tract) infections: Secondary | ICD-10-CM

## 2020-02-23 DIAGNOSIS — R8271 Bacteriuria: Secondary | ICD-10-CM | POA: Diagnosis not present

## 2020-02-23 DIAGNOSIS — Z79899 Other long term (current) drug therapy: Secondary | ICD-10-CM

## 2020-02-23 DIAGNOSIS — E861 Hypovolemia: Secondary | ICD-10-CM | POA: Diagnosis present

## 2020-02-23 DIAGNOSIS — T502X5A Adverse effect of carbonic-anhydrase inhibitors, benzothiadiazides and other diuretics, initial encounter: Secondary | ICD-10-CM | POA: Diagnosis present

## 2020-02-23 DIAGNOSIS — E871 Hypo-osmolality and hyponatremia: Secondary | ICD-10-CM | POA: Diagnosis present

## 2020-02-23 DIAGNOSIS — Z803 Family history of malignant neoplasm of breast: Secondary | ICD-10-CM

## 2020-02-23 DIAGNOSIS — R339 Retention of urine, unspecified: Secondary | ICD-10-CM | POA: Diagnosis present

## 2020-02-23 DIAGNOSIS — E1165 Type 2 diabetes mellitus with hyperglycemia: Secondary | ICD-10-CM | POA: Diagnosis present

## 2020-02-23 DIAGNOSIS — K219 Gastro-esophageal reflux disease without esophagitis: Secondary | ICD-10-CM | POA: Diagnosis present

## 2020-02-23 DIAGNOSIS — E039 Hypothyroidism, unspecified: Secondary | ICD-10-CM | POA: Diagnosis present

## 2020-02-23 DIAGNOSIS — Z885 Allergy status to narcotic agent status: Secondary | ICD-10-CM | POA: Diagnosis not present

## 2020-02-23 DIAGNOSIS — I499 Cardiac arrhythmia, unspecified: Secondary | ICD-10-CM | POA: Diagnosis present

## 2020-02-23 DIAGNOSIS — Z841 Family history of disorders of kidney and ureter: Secondary | ICD-10-CM

## 2020-02-23 DIAGNOSIS — M79671 Pain in right foot: Secondary | ICD-10-CM | POA: Diagnosis not present

## 2020-02-23 DIAGNOSIS — J189 Pneumonia, unspecified organism: Secondary | ICD-10-CM

## 2020-02-23 DIAGNOSIS — R110 Nausea: Secondary | ICD-10-CM | POA: Diagnosis not present

## 2020-02-23 DIAGNOSIS — Z8719 Personal history of other diseases of the digestive system: Secondary | ICD-10-CM

## 2020-02-23 DIAGNOSIS — I350 Nonrheumatic aortic (valve) stenosis: Secondary | ICD-10-CM

## 2020-02-23 DIAGNOSIS — Z79890 Hormone replacement therapy (postmenopausal): Secondary | ICD-10-CM | POA: Diagnosis not present

## 2020-02-23 DIAGNOSIS — K529 Noninfective gastroenteritis and colitis, unspecified: Secondary | ICD-10-CM | POA: Diagnosis present

## 2020-02-23 DIAGNOSIS — I959 Hypotension, unspecified: Secondary | ICD-10-CM | POA: Diagnosis not present

## 2020-02-23 DIAGNOSIS — I498 Other specified cardiac arrhythmias: Secondary | ICD-10-CM

## 2020-02-23 LAB — COMPREHENSIVE METABOLIC PANEL
ALT: 34 U/L (ref 0–44)
AST: 40 U/L (ref 15–41)
Albumin: 4 g/dL (ref 3.5–5.0)
Alkaline Phosphatase: 67 U/L (ref 38–126)
Anion gap: 12 (ref 5–15)
BUN: 13 mg/dL (ref 8–23)
CO2: 23 mmol/L (ref 22–32)
Calcium: 9.3 mg/dL (ref 8.9–10.3)
Chloride: 80 mmol/L — ABNORMAL LOW (ref 98–111)
Creatinine, Ser: 0.85 mg/dL (ref 0.44–1.00)
GFR, Estimated: >60 mL/min (ref >60)
Glucose, Bld: 131 mg/dL — ABNORMAL HIGH (ref 70–99)
Potassium: 3.6 mmol/L (ref 3.5–5.1)
Sodium: 115 mmol/L — CL (ref 135–145)
Total Bilirubin: 0.8 mg/dL (ref 0.3–1.2)
Total Protein: 7.2 g/dL (ref 6.5–8.1)

## 2020-02-23 LAB — CBC WITH DIFFERENTIAL/PLATELET
Abs Immature Granulocytes: 0.07 10*3/uL (ref 0.00–0.07)
Basophils Absolute: 0 10*3/uL (ref 0.0–0.1)
Basophils Relative: 0 %
Eosinophils Absolute: 0 10*3/uL (ref 0.0–0.5)
Eosinophils Relative: 0 %
HCT: 36.7 % (ref 36.0–46.0)
Hemoglobin: 13.2 g/dL (ref 12.0–15.0)
Immature Granulocytes: 0 %
Lymphocytes Relative: 8 %
Lymphs Abs: 1.3 10*3/uL (ref 0.7–4.0)
MCH: 28.3 pg (ref 26.0–34.0)
MCHC: 36 g/dL (ref 30.0–36.0)
MCV: 78.8 fL — ABNORMAL LOW (ref 80.0–100.0)
Monocytes Absolute: 0.9 10*3/uL (ref 0.1–1.0)
Monocytes Relative: 6 %
Neutro Abs: 14.1 10*3/uL — ABNORMAL HIGH (ref 1.7–7.7)
Neutrophils Relative %: 86 %
Platelets: 322 10*3/uL (ref 150–400)
RBC: 4.66 MIL/uL (ref 3.87–5.11)
RDW: 12.7 % (ref 11.5–15.5)
WBC: 16.3 10*3/uL — ABNORMAL HIGH (ref 4.0–10.5)
nRBC: 0 % (ref 0.0–0.2)

## 2020-02-23 LAB — BASIC METABOLIC PANEL
Anion gap: 10 (ref 5–15)
BUN: 12 mg/dL (ref 8–23)
CO2: 25 mmol/L (ref 22–32)
Calcium: 9.1 mg/dL (ref 8.9–10.3)
Chloride: 83 mmol/L — ABNORMAL LOW (ref 98–111)
Creatinine, Ser: 0.88 mg/dL (ref 0.44–1.00)
GFR, Estimated: >60 mL/min (ref >60)
Glucose, Bld: 111 mg/dL — ABNORMAL HIGH (ref 70–99)
Potassium: 3.5 mmol/L (ref 3.5–5.1)
Sodium: 118 mmol/L — CL (ref 135–145)

## 2020-02-23 LAB — URINALYSIS, ROUTINE W REFLEX MICROSCOPIC
Bilirubin Urine: NEGATIVE
Glucose, UA: NEGATIVE mg/dL
Ketones, ur: NEGATIVE mg/dL
Nitrite: NEGATIVE
Protein, ur: NEGATIVE mg/dL
Specific Gravity, Urine: 1.015 (ref 1.005–1.030)
pH: 7 (ref 5.0–8.0)

## 2020-02-23 LAB — URINALYSIS, MICROSCOPIC (REFLEX)

## 2020-02-23 LAB — OSMOLALITY, URINE: Osmolality, Ur: 364 mOsm/kg (ref 300–900)

## 2020-02-23 LAB — OSMOLALITY: Osmolality: 247 mOsm/kg — CL (ref 275–295)

## 2020-02-23 LAB — RESPIRATORY PANEL BY RT PCR (FLU A&B, COVID)
Influenza A by PCR: NEGATIVE
Influenza B by PCR: NEGATIVE
SARS Coronavirus 2 by RT PCR: NEGATIVE

## 2020-02-23 LAB — SODIUM, URINE, RANDOM: Sodium, Ur: 55 mmol/L

## 2020-02-23 MED ORDER — ONDANSETRON HCL 4 MG/2ML IJ SOLN: 4.0000 mg | Freq: Four times a day (QID) | INTRAMUSCULAR | Status: DC | PRN

## 2020-02-23 MED ORDER — SODIUM CHLORIDE 0.9 % IV SOLN: INTRAVENOUS | Status: DC

## 2020-02-23 MED ORDER — ONDANSETRON HCL 4 MG/2ML IJ SOLN
4.0000 mg | Freq: Once | INTRAMUSCULAR | Status: AC
  Administered 2020-02-23: 4 mg via INTRAVENOUS
  Filled 2020-02-23: qty 2

## 2020-02-23 MED ORDER — SODIUM CHLORIDE 0.9 % IV SOLN
1.0000 g | Freq: Once | INTRAVENOUS | Status: AC
  Administered 2020-02-23: 1 g via INTRAVENOUS
  Filled 2020-02-23: qty 10

## 2020-02-23 MED ORDER — LACTATED RINGERS IV BOLUS
250.0000 mL | Freq: Once | INTRAVENOUS | Status: AC
  Administered 2020-02-23: 250 mL via INTRAVENOUS

## 2020-02-23 MED ORDER — ACETAMINOPHEN 325 MG PO TABS: 650.0000 mg | ORAL_TABLET | ORAL | Status: DC | PRN

## 2020-02-23 NOTE — ED Notes (Addendum)
Date and time results received: 02/23/20 10:11 PM  Test: osmol Critical Value: 247  Name of Provider Notified: Dr. Leafy Half

## 2020-02-23 NOTE — ED Notes (Signed)
Attempted to call report to floor x1. 

## 2020-02-23 NOTE — ED Notes (Signed)
Attempted to call report x2

## 2020-02-23 NOTE — ED Triage Notes (Addendum)
C/o " vertigo" x 2 days , with n/v/d , pt vomiting in triage , antivert taken at 1100, w/o improvement

## 2020-02-23 NOTE — ED Notes (Signed)
Pt cycling vitals 

## 2020-02-23 NOTE — ED Provider Notes (Addendum)
[facility] EMERGENCY DEPARTMENT Provider Note   CSN: [id number] Arrival date & time: 02/23/20  1552     History Chief Complaint  Patient presents with  . Dizziness    Margaret Lucas is a 85 y.o. female.  HPI   Margaret Lucas is an 85 y.o. lady with PMHx vertigo, hypothyroidism, hyponatremia, diet-controlled DM, mallory-weiss tear with blood loss anemia, mild aortic stenosis, and UTI, presenting with nausea, vomiting, and diarrhea. She states that she has not had much of an appetite since last night. She developed mild nausea last night that has progressively worsened. She said she was unable to sleep well last night due to diffuse muscle cramping and her nausea. She vomited 3 or 4 times earlier today. Her vomit was clear, without blood. She had one normal bowel movement this morning, but developed watery diarrhea this afternoon that was not dark or bloody. She endorses chronic, unchanged dizziness that remains unchanged s/p meclizine this afternoon and is not present currently at rest. She states that she had an "enlarged tonsil" yesterday, for which she has been taking Mucinex in addition to her Allegra. She notes she had been prescribed Lisinopril 40mg , amlodipine 10mg  and bisoprolol by Dr. a couple of months ago for HTN, but was unable to tolerate bisoprolol, and was switched over to chlorthalidone by Dr. , her cardiologist, at the end of October. She had not taken her lisinopril this morning, but did take her chlorthalidone. She does endorse feeling subjective fevers and chills when asked, although her son states she has been denying these to him. She denies any difficulties urinating or hematuria. She has received the COVID-19 vaccinations and booster shot and flu vaccine and denies any sick contacts or eating anything out of the ordinary this morning or last night.   Past Medical History:  Diagnosis Date  . Diabetes mellitus without complication (HCC)   . Heart  murmur   . Hypertension   . 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 Osteoporosis   . Vertigo     Patient Active Problem List   Diagnosis Date Noted  . UTI (urinary tract infection) 08/09/2016  . Acute blood loss anemia   . Mallory-Weiss syndrome   . Upper GI bleed 08/07/2016  . Hyponatremia 08/07/2016  . Elevated LFTs 08/07/2016  . Hypothyroidism, unspecified 08/07/2016  . Essential hypertension 08/07/2016    Past Surgical History:  Procedure Laterality Date  . CHOLECYSTECTOMY    . COLONOSCOPY  2010 last  . ESOPHAGOGASTRODUODENOSCOPY N/A 08/08/2016   Procedure: ESOPHAGOGASTRODUODENOSCOPY (EGD);  Surgeon: 08/09/2016, MD;  Location: [facility] ENDOSCOPY;  Service: Endoscopy;  Laterality: N/A;  . 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   . TUBAL LIGATION       OB History   No obstetric history on file.     Family History  Problem Relation Age of Onset  . Stroke Mother   . Breast cancer Sister   . Cancer Sister   . Pneumonia Father   . Kidney failure Brother   . Aortic stenosis Brother     Social History   Tobacco Use  . Smoking status: Never Smoker  . Smokeless tobacco: Never Used  Substance Use Topics  . Alcohol use: No  . Drug use: No    Home Medications Prior to Admission medications   Medication Sig Start Date End Date Taking? Authorizing Provider  Calcium Carbonate-Vitamin D (CALCIUM 600+D) 600-200 MG-UNIT TABS Take  1 tablet by mouth daily.    [provider]  chlorthalidone (HYGROTON) 25 MG tablet Take 1 tablet (25 mg total) by mouth daily. 02/15/20 05/15/20  [place]il[place]Jordan, Peter M, MD  cholecalciferol (VITAMIN D) 1000 units tablet Take 5,000 Units by mouth daily.    [provider]  fluticasone (FLONASE) 50 MCG/ACT nasal spray Place 1-2 sprays into both nostrils daily.    [provider]  levothyroxine (SYNTHROID, LEVOTHROID) 100 MCG tablet Take 100 mcg by mouth daily before breakfast.     [provider]  lisinopril (PRINIVIL,ZESTRIL) 10 MG tablet Take 10 mg by mouth daily. 11/03/16   [provider]  meclizine (ANTIVERT) 25 MG tablet Take 1 tablet (25 mg total) by mouth 3 (three) times daily as needed for dizziness. 07/16/16   Long, Arlyss RepressJoshua G, MD  Multiple Vitamins-Minerals (ICAPS AREDS FORMULA PO) Take 1 tablet by mouth 2 (two) times daily.    [provider]  pantoprazole (PROTONIX) 40 MG tablet Take 1 tablet (40 mg total) by mouth 2 (two) times daily. 08/11/16   Pearson GrippeKim, James, MD    Allergies    Demerol [meperidine]  Review of Systems   Review of Systems: Positive for cold intolerance. 10 point review of systems otherwise negative except as noted above in HPI.   Physical Exam Updated Vital Signs BP 126/60   Pulse 63   Temp 98.1 F (36.7 C) (Oral)   Resp 16   Ht 5\' 3"  (1.6 m)   Wt 57.2 kg   SpO2 96%   BMI 22.32 kg/m   Physical Exam   General: Patient appears uncomfortable but otherwise well, in no acute distress.  Eyes: Sclera non-icteric. No conjunctival injection. PERRLA. EMOI, bilaterally.  HENT: Neck is supple. No nasal discharge. No tonsillar enlargement, erythema, or exudates. Posterior oropharynx is not erythematous. Patient is edentulous.  Respiratory: Lungs are CTA, bilaterally. No wheezes, rales, or rhonchi. No tachypnea or increased work of breathing.  Cardiovascular: Rate is normal, although rhythm is irregularly irregular. There is a crescendo-decrescendo murmur heard best along the right upper chest and left sternal borders, consistent with aortic stenosis. No other murmurs, rubs, or gallops. No lower extremity edema.  Neurological: Cranial nerves II-XII intact. Patient does endorse mild dizziness on lateral eye movements bilaterally but does not have nystagmus.  Musculoskeletal: Strength is 5/5 in all extremities. There is mild cachexia diffusely. ROMI in all four extremities.  Abdominal: Soft and non-tender to palpation. Bowel  sounds intact. No rebound or guarding. No distention.  Skin: No lesions. No rashes.  Psych: Patient appears anxious. Normal tone of voice.   ED Results / Procedures / Treatments   Labs (all labs ordered are listed, but only abnormal results are displayed) Labs Reviewed  CBC WITH DIFFERENTIAL/PLATELET - Abnormal; Notable for the following components:      Result Value   WBC 16.3 (*)    MCV 78.8 (*)    Neutro Abs 14.1 (*)    All other components within normal limits  URINALYSIS, ROUTINE W REFLEX MICROSCOPIC - Abnormal; Notable for the following components:   APPearance CLOUDY (*)    Hgb urine dipstick TRACE (*)    Leukocytes,Ua MODERATE (*)    All other components within normal limits  COMPREHENSIVE METABOLIC PANEL - Abnormal; Notable for the following components:   Sodium 115 (*)    Chloride 80 (*)    Glucose, Bld 131 (*)    All other components within normal limits  URINALYSIS, MICROSCOPIC (REFLEX) - Abnormal;  Notable for the following components:   Bacteria, UA FEW (*)    All other components within normal limits  URINE CULTURE  OSMOLALITY  OSMOLALITY, URINE  SODIUM, URINE, RANDOM    EKG EKG Interpretation  Date/Time:  Tuesday February 23 2020 18:01:37 EST Ventricular Rate:  76 PR Interval:    QRS Duration: 156 QT Interval:  423 QTC Calculation: 476 R Axis:   122 Text Interpretation: Sinus rhythm Right bundle branch block Confirmed by Tilden Fossa [phone]) on 02/23/2020 6:20:05 PM    EKG shows NSR at a rate of 76 bpm with RBBB seen on previous EKG's and T wave inversions seen in V1 and V2 also noted on prior EKG from 05/04/19, but new T wave inversion in V3. There are non-specific T wave abnormalities noted on previous EKG's without new ST depressions or elevations concerning for acute ischemia. Normal PR and QTc intervals.   Radiology No results found.  Procedures Procedures (including critical care time)  Medications Ordered in ED Medications  cefTRIAXone  (ROCEPHIN) 1 g in sodium chloride 0.9 % 100 mL IVPB (1 g Intravenous New Bag/Given 02/23/20 2006)  lactated ringers bolus 250 mL (0 mLs Intravenous Stopped 02/23/20 1919)  ondansetron (ZOFRAN) injection 4 mg (4 mg Intravenous Given 02/23/20 1818)    ED Course  I have reviewed the triage vital signs and the nursing notes.  Pertinent labs & imaging results that were available during my care of the patient were reviewed by me and considered in my medical decision making (see chart for details).    MDM Rules/Calculators/A&P                          Patient's acute nausea, vomiting, and diarrhea over the past day are most concerning for gastritis. However, patient's muscle cramping may be in the setting of hypokalemia and patient does have a history of hyponatremia, and has recently been started on Chlorthalidone in addition to lisinopril and amlodipine for HTN.  - Will check CBC for anemia and leukocytosis given recent Mallory-Weiss tear and blood loss anemia and check urinalysis to rule out infectious cause given history of UTI's; however, patient denies urinary complaints or blood loss currently. - Will check EKG to rule out cardiac etiology, especially in setting of irregular heart rhythm on auscultation - Will give 250cc's LR given patient appears mildly dry on examination but avoid fluid overload in setting of mild AS noted on previous ECHO from 2017. - Will give Zofran for nausea if QTc interval allows - Will check CMP to rule out electrolyte derrangement, worsening transaminitis, or AKI in setting of N/V/D  6:13pm: EKG shows NSR with old RBBB and T wave inversions in V1 and V2 with new T wave inversion in V3, with normal PR and QTc intervals. However, there are no new ST segment changes and patient denies any CP, diaphoresis, or other concerning findings to suggest cardiac ischemia as a cause of her symptoms.  CBC shows leukocytosis with neutrophilia, WBC 16.3, neutrophil count 14.1 with left  shift, concerning for infection. Hemoglobin is normal at 13.2, although MCV is low at 78.8, concerning for iron deficiency.  - Will give Zofran 4mg  IV once for nausea  - Will hold off on ordering troponins  - CMP and U/A pending.   6:45pm: Patient is resting comfortably without nausea s/p Zofran.  Explained CMP had to be recollected as it was hemolyzed.   7:48pm: U/A shows cloudy urine, positive for trace  hemoglobin and moderate leukocytes, with few bacteria, 21-50 WBC, and 0-5 RBC without squamous epithelium. With leukocytosis noted on CBC and no other clear source of her nausea, vomiting, and chills, consider acute pyelonephritis.  - Will give IV Rocephin 1g   8:15pm: Patient is resting comfortably in bed without pain or nausea. On examination, she did not have CVA tenderness. Her rhythm appeared irregular on continuous monitoring, so repeat EKG was performed, which showed atrial arrhythmia with varying P wave morphology, but no atrial fibrillation. Her CMP returned with severer hyponatremia with a sodium of 115, normal potassium of 3.6, and noaml creatinine of 0.85. She had mild hyperglycemia but CMP was otherwise unremarkable. She has had hyponatremia in the past, which is likely due to poor PO intake in addition to recent addition of Chlorthalidone (in addition to lisinopril) for BP control. - Patient will need to be admitted for severe hyponatremia - Will order respiratory panel for COVID-19 screening - Will order serum osms, urine osms, and urine sodium for further workup - She will likely require continued antibiotic treatment for UTI vs. Pyelonephritis  Final Clinical Impression(s) / ED Diagnoses Final diagnoses:  RBBB  Microcytosis  Pyelonephritis, acute  Hyponatremia  Aortic valve stenosis, etiology of cardiac valve disease unspecified  Atrial arrhythmia    Rx / DC Orders ED Discharge Orders    None     Glenford Bayley, MD 02/23/2020, 8:32 PM Pager: [phone]      Glenford Bayley, MD 02/23/20 2043    Glenford Bayley, MD 02/23/20 7619    Tilden Fossa, MD 02/24/20 0008

## 2020-02-23 NOTE — ED Notes (Signed)
Ice chips provided.

## 2020-02-23 NOTE — ED Notes (Signed)
Per pt unable to provide urine collection at this time. 

## 2020-02-23 NOTE — ED Notes (Signed)
Date and time results received: 02/23/20 2011 (use smartphrase ".now" to insert current time)  Test: Sodium Critical Value: 115  Name of Provider Notified: Dr Madilyn Hook  Orders Received? Or Actions Taken?: none

## 2020-02-24 ENCOUNTER — Encounter (HOSPITAL_COMMUNITY): Payer: Self-pay | Admitting: Internal Medicine

## 2020-02-24 DIAGNOSIS — R8271 Bacteriuria: Secondary | ICD-10-CM | POA: Diagnosis present

## 2020-02-24 DIAGNOSIS — E871 Hypo-osmolality and hyponatremia: Secondary | ICD-10-CM | POA: Diagnosis not present

## 2020-02-24 DIAGNOSIS — K529 Noninfective gastroenteritis and colitis, unspecified: Secondary | ICD-10-CM

## 2020-02-24 LAB — BASIC METABOLIC PANEL
Anion gap: 9 (ref 5–15)
Anion gap: 9 (ref 5–15)
Anion gap: 10 (ref 5–15)
Anion gap: 8 (ref 5–15)
Anion gap: 8 (ref 5–15)
BUN: 26 mg/dL — ABNORMAL HIGH (ref 8–23)
BUN: 26 mg/dL — ABNORMAL HIGH (ref 8–23)
BUN: 11 mg/dL (ref 8–23)
BUN: 14 mg/dL (ref 8–23)
BUN: 17 mg/dL (ref 8–23)
CO2: 26 mmol/L (ref 22–32)
CO2: 27 mmol/L (ref 22–32)
CO2: 23 mmol/L (ref 22–32)
CO2: 21 mmol/L — ABNORMAL LOW (ref 22–32)
CO2: 20 mmol/L — ABNORMAL LOW (ref 22–32)
Calcium: 8.6 mg/dL — ABNORMAL LOW (ref 8.9–10.3)
Calcium: 8.8 mg/dL — ABNORMAL LOW (ref 8.9–10.3)
Calcium: 8.8 mg/dL — ABNORMAL LOW (ref 8.9–10.3)
Calcium: 8.1 mg/dL — ABNORMAL LOW (ref 8.9–10.3)
Calcium: 8 mg/dL — ABNORMAL LOW (ref 8.9–10.3)
Chloride: 105 mmol/L (ref 98–111)
Chloride: 105 mmol/L (ref 98–111)
Chloride: 90 mmol/L — ABNORMAL LOW (ref 98–111)
Chloride: 93 mmol/L — ABNORMAL LOW (ref 98–111)
Chloride: 93 mmol/L — ABNORMAL LOW (ref 98–111)
Creatinine, Ser: 1.13 mg/dL — ABNORMAL HIGH (ref 0.44–1.00)
Creatinine, Ser: 1.14 mg/dL — ABNORMAL HIGH (ref 0.44–1.00)
Creatinine, Ser: 0.85 mg/dL (ref 0.44–1.00)
Creatinine, Ser: 0.85 mg/dL (ref 0.44–1.00)
Creatinine, Ser: 0.82 mg/dL (ref 0.44–1.00)
GFR, Estimated: 48 mL/min — ABNORMAL LOW (ref >60)
GFR, Estimated: 48 mL/min — ABNORMAL LOW (ref >60)
GFR, Estimated: >60 mL/min (ref >60)
GFR, Estimated: >60 mL/min (ref >60)
GFR, Estimated: >60 mL/min (ref >60)
Glucose, Bld: 104 mg/dL — ABNORMAL HIGH (ref 70–99)
Glucose, Bld: 104 mg/dL — ABNORMAL HIGH (ref 70–99)
Glucose, Bld: 99 mg/dL (ref 70–99)
Glucose, Bld: 119 mg/dL — ABNORMAL HIGH (ref 70–99)
Glucose, Bld: 98 mg/dL (ref 70–99)
Potassium: 4.1 mmol/L (ref 3.5–5.1)
Potassium: 4.1 mmol/L (ref 3.5–5.1)
Potassium: 3.8 mmol/L (ref 3.5–5.1)
Potassium: 3.7 mmol/L (ref 3.5–5.1)
Potassium: 4.1 mmol/L (ref 3.5–5.1)
Sodium: 140 mmol/L (ref 135–145)
Sodium: 141 mmol/L (ref 135–145)
Sodium: 123 mmol/L — ABNORMAL LOW (ref 135–145)
Sodium: 122 mmol/L — ABNORMAL LOW (ref 135–145)
Sodium: 121 mmol/L — ABNORMAL LOW (ref 135–145)

## 2020-02-24 LAB — COMPREHENSIVE METABOLIC PANEL
ALT: 33 U/L (ref 0–44)
AST: 50 U/L — ABNORMAL HIGH (ref 15–41)
Albumin: 3.4 g/dL — ABNORMAL LOW (ref 3.5–5.0)
Alkaline Phosphatase: 58 U/L (ref 38–126)
Anion gap: 7 (ref 5–15)
BUN: 12 mg/dL (ref 8–23)
CO2: 24 mmol/L (ref 22–32)
Calcium: 8.6 mg/dL — ABNORMAL LOW (ref 8.9–10.3)
Chloride: 86 mmol/L — ABNORMAL LOW (ref 98–111)
Creatinine, Ser: 0.75 mg/dL (ref 0.44–1.00)
GFR, Estimated: >60 mL/min (ref >60)
Glucose, Bld: 96 mg/dL (ref 70–99)
Potassium: 3.3 mmol/L — ABNORMAL LOW (ref 3.5–5.1)
Sodium: 117 mmol/L — CL (ref 135–145)
Total Bilirubin: 0.7 mg/dL (ref 0.3–1.2)
Total Protein: 6.3 g/dL — ABNORMAL LOW (ref 6.5–8.1)

## 2020-02-24 LAB — CBC
HCT: 31.8 % — ABNORMAL LOW (ref 36.0–46.0)
Hemoglobin: 11.4 g/dL — ABNORMAL LOW (ref 12.0–15.0)
MCH: 28.8 pg (ref 26.0–34.0)
MCHC: 35.8 g/dL (ref 30.0–36.0)
MCV: 80.3 fL (ref 80.0–100.0)
Platelets: 256 10*3/uL (ref 150–400)
RBC: 3.96 MIL/uL (ref 3.87–5.11)
RDW: 12.6 % (ref 11.5–15.5)
WBC: 10.3 10*3/uL (ref 4.0–10.5)
nRBC: 0 % (ref 0.0–0.2)

## 2020-02-24 MED ORDER — PANTOPRAZOLE SODIUM 40 MG IV SOLR
40.0000 mg | Freq: Once | INTRAVENOUS | Status: AC
  Administered 2020-02-24: 40 mg via INTRAVENOUS
  Filled 2020-02-24: qty 40

## 2020-02-24 MED ORDER — ENOXAPARIN SODIUM 40 MG/0.4ML ~~LOC~~ SOLN
40.0000 mg | SUBCUTANEOUS | Status: DC
  Administered 2020-02-24 – 2020-02-25 (×2): 40 mg via SUBCUTANEOUS
  Filled 2020-02-24 (×3): qty 0.4

## 2020-02-24 MED ORDER — MECLIZINE HCL 25 MG PO TABS: 25.0000 mg | ORAL_TABLET | Freq: Three times a day (TID) | ORAL | Status: DC | PRN

## 2020-02-24 MED ORDER — LEVOTHYROXINE SODIUM 100 MCG PO TABS
100.0000 ug | ORAL_TABLET | Freq: Every day | ORAL | Status: DC
  Administered 2020-02-24 – 2020-02-26 (×3): 100 ug via ORAL
  Filled 2020-02-24 (×3): qty 1

## 2020-02-24 MED ORDER — PANTOPRAZOLE SODIUM 40 MG PO TBEC
40.0000 mg | DELAYED_RELEASE_TABLET | Freq: Every day | ORAL | Status: DC
  Administered 2020-02-24 – 2020-02-26 (×3): 40 mg via ORAL
  Filled 2020-02-24 (×3): qty 1

## 2020-02-24 MED ORDER — SODIUM CHLORIDE 0.9 % IV SOLN
1.0000 g | INTRAVENOUS | Status: DC
  Administered 2020-02-24 – 2020-02-25 (×2): 1 g via INTRAVENOUS
  Filled 2020-02-24 (×2): qty 1

## 2020-02-24 MED ORDER — ONDANSETRON HCL 4 MG PO TABS
4.0000 mg | ORAL_TABLET | Freq: Four times a day (QID) | ORAL | Status: DC | PRN
  Administered 2020-02-24 (×2): 4 mg via ORAL
  Filled 2020-02-24 (×2): qty 1

## 2020-02-24 MED ORDER — ONDANSETRON HCL 4 MG/2ML IJ SOLN: 4.0000 mg | Freq: Four times a day (QID) | INTRAMUSCULAR | Status: DC | PRN

## 2020-02-24 MED ORDER — ACETAMINOPHEN 650 MG RE SUPP: 650.0000 mg | Freq: Four times a day (QID) | RECTAL | Status: DC | PRN

## 2020-02-24 MED ORDER — LISINOPRIL 10 MG PO TABS
10.0000 mg | ORAL_TABLET | Freq: Every day | ORAL | Status: DC
  Administered 2020-02-24 – 2020-02-26 (×3): 10 mg via ORAL
  Filled 2020-02-24 (×3): qty 1

## 2020-02-24 MED ORDER — ACETAMINOPHEN 325 MG PO TABS: 650.0000 mg | ORAL_TABLET | Freq: Four times a day (QID) | ORAL | Status: DC | PRN

## 2020-02-24 MED ORDER — POTASSIUM CHLORIDE IN NACL 20-0.9 MEQ/L-% IV SOLN
INTRAVENOUS | Status: DC
  Filled 2020-02-24 (×5): qty 1000

## 2020-02-24 MED ORDER — DICYCLOMINE HCL 10 MG PO CAPS
10.0000 mg | ORAL_CAPSULE | Freq: Once | ORAL | Status: AC
  Administered 2020-02-24: 10 mg via ORAL
  Filled 2020-02-24: qty 1

## 2020-02-24 NOTE — Progress Notes (Signed)
Performed bladder scan due to patient's inability to void since admission. Resulting scan showed >528mL , visibile distention noted to lower abdomen. On call paged for further orders.

## 2020-02-24 NOTE — H&P (Signed)
History and Physical    Margaret Lucas VHQ:[id number] DOB: 07-19-1934 DOA: 02/23/2020  PCP: Irena Reichmann, DO   Patient coming from: Home.   I have personally briefly reviewed patient's old medical records in [facility]  Chief Complaint: "Low sodium".  HPI: Margaret Lucas is a 85 y.o. female with medical history significant of type 2 diabetes, aortic stenosis, hypertension, osteoporosis, vertigo, history of Mallory-Weiss syndrome who is transferred from [facility] where she presented with complaints of having vertigo for the past 2 days associated with nausea, vomiting and diarrhea.  The patient mentions that she did not feel very well yesterday and was feeling nauseous.  She thought she had something in one of her tonsils, took an Careers adviser and Mucinex tablet.  She continued to be nauseous through the night associated with abdominal cramping that kept her up most of the night.  She had a bowel movement in the morning which provided some temporary relief. The patient took a meclizine 25 mg p.o. without significant improvement.  However, later in the morning and through the day she had at least 5 episodes of emesis and 4 or 5 episodes of diarrhea.  She denies fever, chills, night sweats, productive cough, wheezing or hemoptysis.  No chest pain, palpitations, diaphoresis, PND, orthopnea or recent pitting edema of the lower extremities.  She denies constipation, melena or hematochezia.  No dysuria, frequency, hematuria or flank pain.  She denies polyuria, polydipsia, polyphagia or blurred vision.  ED Course: Initial vital signs were temperature 98.1 F, pulse 72, respiration 18, BP 153/63 mmHg and O2 sat 100% on room air.  Patient received a 250 mL NS bolus, ondansetron 4 mg IVP x1, ceftriaxone 1 g IVPB and maintenance IVF.  Work-up: Urinalysis was cloudy with trace hemoglobinuria and moderate leukocyte esterase.  Nitrite was negative.  There was 0-5 RBC and 21-50 WBC per hpf.   There were a few bacteria.  No casts were reported on the UA reflex microscopic exam.  CBC showed a white count of 16.3 with 86% neutrophils, 8% lymphocytes and 6% monocytes.  Hemoglobin was 13.2 g/dL and platelets 413.  Sodium was 115, potassium 3.6, chloride 80 and CO2 23 mmol/L.  Her osmolality was 247 mOsm/kg.  Her blood glucose was 131 mg/dL.  Renal function, calcium and hepatic function tests were within expected range.  Urine sodium was 55 mmol/L.  Her 1 view chest radiograph did not have any acute cardiopulmonary pathology.  Review of Systems: As per HPI otherwise all other systems reviewed and are negative.  Past Medical History:  Diagnosis Date  . Diabetes mellitus without complication (HCC)   . Heart murmur   . Hypertension   . 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 Osteoporosis   . Vertigo    Past Surgical History:  Procedure Laterality Date  . CHOLECYSTECTOMY    . COLONOSCOPY  2010 last  . ESOPHAGOGASTRODUODENOSCOPY N/A 08/08/2016   Procedure: ESOPHAGOGASTRODUODENOSCOPY (EGD);  Surgeon: Iva Boop, MD;  Location: [facility] ENDOSCOPY;  Service: Endoscopy;  Laterality: N/A;  . 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   . TUBAL LIGATION     Social History  reports that she has never smoked. She has never used smokeless tobacco. She reports that she does not drink alcohol and does not use drugs.  Allergies  Allergen Reactions  . Demerol [Meperidine] Other (See Comments)    unknown   Family History  Problem  Relation Age of Onset  . Stroke Mother   . Breast cancer Sister   . Cancer Sister   . Pneumonia Father   . Kidney failure Brother   . Aortic stenosis Brother    Prior to Admission medications   Medication Sig Start Date End Date Taking? Authorizing Provider  Calcium Carbonate-Vitamin D (CALCIUM 600+D) 600-200 MG-UNIT TABS Take 1 tablet by mouth daily.   Yes [provider]  chlorthalidone (HYGROTON) 25 MG tablet  Take 1 tablet (25 mg total) by mouth daily. 02/15/20 05/15/20 Yes [place], Peter M, MD  cholecalciferol (VITAMIN D) 1000 units tablet Take 5,000 Units by mouth daily.   Yes [provider]  fexofenadine (ALLEGRA) 180 MG tablet Take 180 mg by mouth daily.   Yes [provider]  fluticasone (FLONASE) 50 MCG/ACT nasal spray Place 1-2 sprays into both nostrils daily.   Yes [provider]  levothyroxine (SYNTHROID, LEVOTHROID) 100 MCG tablet Take 100 mcg by mouth daily before breakfast.   Yes [provider]  lisinopril (PRINIVIL,ZESTRIL) 10 MG tablet Take 10 mg by mouth daily. 11/03/16  Yes [provider]  meclizine (ANTIVERT) 25 MG tablet Take 1 tablet (25 mg total) by mouth 3 (three) times daily as needed for dizziness. 07/16/16  Yes Long, Arlyss Repress, MD  Multiple Vitamins-Minerals (ICAPS AREDS FORMULA PO) Take 1 tablet by mouth 2 (two) times daily.   Yes [provider]  pantoprazole (PROTONIX) 40 MG tablet Take 1 tablet (40 mg total) by mouth 2 (two) times daily. Patient taking differently: Take 40 mg by mouth daily.  08/11/16  Yes Pearson Grippe, MD   Physical Exam: Vitals:   02/23/20 2230 02/23/20 2300 02/24/20 0005 02/24/20 0015  BP: (!) 153/93 (!) 143/69  (!) 164/56  Pulse: 67 71  65  Resp: 17 17  18   Temp:    98 F (36.7 C)  TempSrc:    Oral  SpO2: 98% 98%  100%  Weight:   53.8 kg   Height:   5\' 3"  (1.6 m)    Constitutional: NAD, calm, comfortable Eyes: PERRL, lids and conjunctivae normal ENMT: Mucous membranes are mildly dry. Posterior pharynx clear of any exudate or lesions. Neck: normal, supple, no masses, no thyromegaly Respiratory: clear to auscultation bilaterally, no wheezing, no crackles. Normal respiratory effort. No accessory muscle use.  Cardiovascular: Regular rate and rhythm, 2/6 SEM, no rubs / gallops. No extremity edema. 2+ pedal pulses. No carotid bruits.  Abdomen: Nondistended. Bowel sounds positive.  Soft, positive LLQ  tenderness, no guarding or rebound, masses palpated. No hepatosplenomegaly. Musculoskeletal: no clubbing / cyanosis.  Good ROM, no contractures. Normal muscle tone.  Skin: no obvious rashes, lesions, ulcers on very limited dermatological exam. Neurologic: CN 2-12 grossly intact. Sensation intact, DTR normal. Strength 5/5 in all 4.  Psychiatric: Normal judgment and insight. Alert and oriented x 3. Normal mood.   Labs on Admission: I have personally reviewed following labs and imaging studies  CBC: Recent Labs  Lab 02/23/20 1804  WBC 16.3*  NEUTROABS 14.1*  HGB 13.2  HCT 36.7  MCV 78.8*  PLT 322   Basic Metabolic Panel: Recent Labs  Lab 02/23/20 1919 02/23/20 2241  NA 115* 118*  K 3.6 3.5  CL 80* 83*  CO2 23 25  GLUCOSE 131* 111*  BUN 13 12  CREATININE 0.85 0.88  CALCIUM 9.3 9.1   GFR: Estimated Creatinine Clearance: 40.1 mL/min (by C-G formula based on SCr of 0.88 mg/dL).  Liver Function Tests:  Recent Labs  Lab 02/23/20 1919  AST 40  ALT 34  ALKPHOS 67  BILITOT 0.8  PROT 7.2  ALBUMIN 4.0   Urine analysis:    Component Value Date/Time   COLORURINE YELLOW 02/23/2020 1919   APPEARANCEUR CLOUDY (A) 02/23/2020 1919   LABSPEC 1.015 02/23/2020 1919   PHURINE 7.0 02/23/2020 1919   GLUCOSEU NEGATIVE 02/23/2020 1919   HGBUR TRACE (A) 02/23/2020 1919   BILIRUBINUR NEGATIVE 02/23/2020 1919   KETONESUR NEGATIVE 02/23/2020 1919   PROTEINUR NEGATIVE 02/23/2020 1919   NITRITE NEGATIVE 02/23/2020 1919   LEUKOCYTESUR MODERATE (A) 02/23/2020 1919   Radiological Exams on Admission: DG Chest 1 View  Result Date: 02/23/2020 CLINICAL DATA:  Vertigo for 2 days, nausea and vomiting EXAM: CHEST  1 VIEW COMPARISON:  07/15/2016 FINDINGS: The heart size and mediastinal contours are within normal limits. Both lungs are clear. The visualized skeletal structures are unremarkable. IMPRESSION: No active disease. Electronically Signed   By: Sharlet Salina M.D.   On: 02/23/2020 22:00    EKG: Independently reviewed.  Vent. rate 76 BPM PR interval * ms QRS duration 156 ms QT/QTc 423/476 ms P-R-T axes 48 122 64 Sinus rhythm Right bundle branch block  Assessment/Plan Principal Problem:   Hyponatremia Diuretic induced. Exacerbated bidirectional GI loss. Observation/progressive unit. Continue normal saline infusion. Hold chlorthalidone. Continue normal saline infusion. Follow-up sodium level.  Active Problems:   Acute gastroenteritis Symptoms seems to be self-limiting. Will defer IV antibiotics at this time. Continue IV hydration. Follow-up CBC and CMP.    Hypothyroidism, unspecified Continue levothyroxine 100 mcg p.o. daily.    Essential hypertension Hold diuretic. Continue lisinopril 10 mg p.o. daily. Monitor BP, renal function electrolytes.    Asymptomatic bacteriuria No urinary symptoms or flank pain. Leukocyte esterase moderately positive. Nitrite was negative. No cast or clumps on UA microscopic examination.     DVT prophylaxis: Lovenox SQ. Code Status:   Full code. Family Communication: Disposition Plan:   Patient is from:  Home.  Anticipated DC to:  Home.  Anticipated DC date:  02/25/2020.  Anticipated DC barriers: Clinical condition.  Consults called: Admission status:  Observation/progressive.  Severity of Illness:  Bobette Mo MD [facility]  How to contact the [facility] Attending or Consulting provider 7A - 7P or covering provider during after hours 7P -7A, for this patient?   1. Check the care team in [facility] and look for a) attending/consulting [facility] provider listed and b) the [facility] team listed 2. Log into [email or web address] and use [place]'s universal password to access. If you do not have the password, please contact the hospital operator. 3. Locate the [facility] provider you are looking for under [facility] and page to a number that you can be directly reached. 4. If you still have difficulty reaching the provider,  please page the [facility] (Director on Call) for the Hospitalists listed on amion for assistance.  02/24/2020, 1:50 AM   This document was prepared using Dragon voice recognition software and may contain some unintended transcription errors.

## 2020-02-24 NOTE — Plan of Care (Signed)

## 2020-02-24 NOTE — Progress Notes (Signed)
PROGRESS NOTE  Brief Narrative: Margaret Lucas is an 85 y.o. female with a history of T2DM, AS, HTN, vertigo, and hyponatremia in setting of dehydration who presented to [facility] 11/9 with nausea, vomiting and diarrhea for a couple days as well as lower abdominal cramping. She was found to be severely hyponatremic (Na 115) with a neutrophilic leukocytosis (WBC 16.3k), and cloudy UA with pyuria. IV fluids were started and the patient was admitted earlier this morning by Dr. Robb Matar. Sodium level has improved as expected while accounting for suspected lab error on this morning's metabolic panel.  Subjective: Confusion is improved per son who brought her to the hospital. No vomiting since arrival.   Objective: BP 134/61 (BP Location: Left Arm)   Pulse 61   Temp (!) 97.5 F (36.4 C) (Oral)   Resp 20   Ht 5\' 3"  (1.6 m)   Wt 53.8 kg   SpO2 100%   BMI 21.01 kg/m   Gen: Nontoxic elderly female in no distress Pulm: Clear and nonlabored  CV: RRR, no murmur, no JVD, no edema GI: Soft, NT, ND, +BS. +suprapubic tenderness without palpably enlarged bladder. Neuro: Alert and oriented. No focal deficits. Skin: No rashes, lesions or ulcers  Labs: Na this AM 140 and 141 on duplicate checks which is grossly out of expected range (up from 117 ~2hrs prior)  Assessment & Plan: Principal Problem:   Hyponatremia Active Problems:   Hypothyroidism, unspecified   Essential hypertension   Asymptomatic bacteriuria   Acute gastroenteritis  Hyponatremia: - Hold chlorthalidone - Continue low rate isotonic saline - Monitor BMP serially.  - Since mental status is clearing, hypertonic saline not indicated, though this is likely an acute hyponatremia due to GI losses and exacerbated by thiazide.   UTI: With leukocytosis, lower abdominal cramping, urinary retention, and pyuria, strongly suspect UTI.  - Continue ceftriaxone (first dose given in ED) pending urine culture (sent and pending)  Acute urinary  retention:  - Bladder scan if not voiding. S/p I&O cath x1 this AM.   Gastroenteritis:  - Symptomatic management, improved overall.   HTN:  - Hold chlorthalidone, continue lisinopril for now.   Hypothyroidism:  - Continue synthroid.   GERD:  - Continue PPI  , MD Pager on amion 02/24/2020, 4:37 PM

## 2020-02-25 DIAGNOSIS — E861 Hypovolemia: Secondary | ICD-10-CM | POA: Diagnosis present

## 2020-02-25 DIAGNOSIS — Z20822 Contact with and (suspected) exposure to covid-19: Secondary | ICD-10-CM | POA: Diagnosis present

## 2020-02-25 DIAGNOSIS — Z885 Allergy status to narcotic agent status: Secondary | ICD-10-CM | POA: Diagnosis not present

## 2020-02-25 DIAGNOSIS — E039 Hypothyroidism, unspecified: Secondary | ICD-10-CM | POA: Diagnosis present

## 2020-02-25 DIAGNOSIS — I4510 Unspecified right bundle-branch block: Secondary | ICD-10-CM | POA: Diagnosis present

## 2020-02-25 DIAGNOSIS — Z823 Family history of stroke: Secondary | ICD-10-CM | POA: Diagnosis not present

## 2020-02-25 DIAGNOSIS — E1165 Type 2 diabetes mellitus with hyperglycemia: Secondary | ICD-10-CM | POA: Diagnosis present

## 2020-02-25 DIAGNOSIS — R8271 Bacteriuria: Secondary | ICD-10-CM | POA: Diagnosis not present

## 2020-02-25 DIAGNOSIS — K529 Noninfective gastroenteritis and colitis, unspecified: Secondary | ICD-10-CM | POA: Diagnosis present

## 2020-02-25 DIAGNOSIS — Z87440 Personal history of urinary (tract) infections: Secondary | ICD-10-CM | POA: Diagnosis not present

## 2020-02-25 DIAGNOSIS — R339 Retention of urine, unspecified: Secondary | ICD-10-CM | POA: Diagnosis present

## 2020-02-25 DIAGNOSIS — E871 Hypo-osmolality and hyponatremia: Secondary | ICD-10-CM | POA: Diagnosis present

## 2020-02-25 DIAGNOSIS — I499 Cardiac arrhythmia, unspecified: Secondary | ICD-10-CM | POA: Diagnosis present

## 2020-02-25 DIAGNOSIS — I350 Nonrheumatic aortic (valve) stenosis: Secondary | ICD-10-CM

## 2020-02-25 DIAGNOSIS — Z841 Family history of disorders of kidney and ureter: Secondary | ICD-10-CM | POA: Diagnosis not present

## 2020-02-25 DIAGNOSIS — Z79890 Hormone replacement therapy (postmenopausal): Secondary | ICD-10-CM | POA: Diagnosis not present

## 2020-02-25 DIAGNOSIS — Z8719 Personal history of other diseases of the digestive system: Secondary | ICD-10-CM | POA: Diagnosis not present

## 2020-02-25 DIAGNOSIS — R718 Other abnormality of red blood cells: Secondary | ICD-10-CM | POA: Diagnosis present

## 2020-02-25 DIAGNOSIS — Z803 Family history of malignant neoplasm of breast: Secondary | ICD-10-CM | POA: Diagnosis not present

## 2020-02-25 DIAGNOSIS — M79671 Pain in right foot: Secondary | ICD-10-CM | POA: Diagnosis not present

## 2020-02-25 DIAGNOSIS — I10 Essential (primary) hypertension: Secondary | ICD-10-CM | POA: Diagnosis present

## 2020-02-25 DIAGNOSIS — Z79899 Other long term (current) drug therapy: Secondary | ICD-10-CM | POA: Diagnosis not present

## 2020-02-25 DIAGNOSIS — E860 Dehydration: Secondary | ICD-10-CM | POA: Diagnosis present

## 2020-02-25 DIAGNOSIS — T502X5A Adverse effect of carbonic-anhydrase inhibitors, benzothiadiazides and other diuretics, initial encounter: Secondary | ICD-10-CM | POA: Diagnosis present

## 2020-02-25 DIAGNOSIS — K219 Gastro-esophageal reflux disease without esophagitis: Secondary | ICD-10-CM | POA: Diagnosis present

## 2020-02-25 LAB — BASIC METABOLIC PANEL
Anion gap: 6 (ref 5–15)
Anion gap: 7 (ref 5–15)
BUN: 15 mg/dL (ref 8–23)
BUN: 16 mg/dL (ref 8–23)
CO2: 23 mmol/L (ref 22–32)
CO2: 22 mmol/L (ref 22–32)
Calcium: 8.5 mg/dL — ABNORMAL LOW (ref 8.9–10.3)
Calcium: 8.4 mg/dL — ABNORMAL LOW (ref 8.9–10.3)
Chloride: 97 mmol/L — ABNORMAL LOW (ref 98–111)
Chloride: 97 mmol/L — ABNORMAL LOW (ref 98–111)
Creatinine, Ser: 0.75 mg/dL (ref 0.44–1.00)
Creatinine, Ser: 0.68 mg/dL (ref 0.44–1.00)
GFR, Estimated: >60 mL/min (ref >60)
GFR, Estimated: >60 mL/min (ref >60)
Glucose, Bld: 93 mg/dL (ref 70–99)
Glucose, Bld: 126 mg/dL — ABNORMAL HIGH (ref 70–99)
Potassium: 4.2 mmol/L (ref 3.5–5.1)
Potassium: 4.2 mmol/L (ref 3.5–5.1)
Sodium: 126 mmol/L — ABNORMAL LOW (ref 135–145)
Sodium: 126 mmol/L — ABNORMAL LOW (ref 135–145)

## 2020-02-25 LAB — OSMOLALITY: Osmolality: 269 mOsm/kg — ABNORMAL LOW (ref 275–295)

## 2020-02-25 LAB — OSMOLALITY, URINE: Osmolality, Ur: 202 mOsm/kg — ABNORMAL LOW (ref 300–900)

## 2020-02-25 NOTE — Evaluation (Signed)
Physical Therapy Evaluation Patient Details Name: Margaret Lucas MRN: [id number] DOB: 12/22/1934 Today's Date: 02/25/2020   History of Present Illness  Margaret Lucas is an 85 y.o. female with a history of T2DM, AS, HTN, vertigo, and hyponatremia in setting of dehydration who presented to [facility] 11/9 with nausea, vomiting and diarrhea for a couple days as well as lower abdominal cramping. She was found to be severely hyponatremic (Na 115) with a neutrophilic leukocytosis (WBC 16.3k), and cloudy UA with pyuria.    Clinical Impression  Margaret Lucas is 85 y.o. female admitted with above HPI and diagnosis. Patient is currently limited by functional impairments below (see PT problem list). Patient lives with her son and is independent at baseline. She is currently limited by weakness, fatigue, and balance impairments. She scored a 44/56 on the BERG indicating significant risk for falling and is unsteady with gait using IV pole. Patient will benefit from continued skilled PT interventions to address impairments and progress independence with mobility, recommending OPPT follow up for balance training. Acute PT will follow and progress as able.     Follow Up Recommendations Outpatient PT (for balance)    Equipment Recommendations  None recommended by PT    Recommendations for Other Services       Precautions / Restrictions Precautions Precautions: Fall Restrictions Weight Bearing Restrictions: No      Mobility  Bed Mobility Overal bed mobility: Needs Assistance             General bed mobility comments: Pt sitting EOB at therapist arrival    Transfers Overall transfer level: Needs assistance Equipment used: None Transfers: Sit to/from Stand Sit to Stand: Min guard         General transfer comment: pt using UE's for power up, steady standing EOB, min guard for safety.  Ambulation/Gait Ambulation/Gait assistance: Min guard;Min assist Gait Distance (Feet): 200  Feet Assistive device: IV Pole Gait Pattern/deviations: Step-through pattern;Decreased stride length;Shuffle;Trunk flexed Gait velocity: fair   General Gait Details: pt taking short steps during gait and occasional staggering/LOB requiring min assist to steady. Pt using IV pole and min assist needed for management. Pt more unsteady without IV pole and reaching for furniture in hospital room to move to recliner.   Stairs            Wheelchair Mobility    Modified Rankin (Stroke Patients Only)       Balance                                 Standardized Balance Assessment Standardized Balance Assessment : Berg Balance Test Berg Balance Test Sit to Stand: Able to stand  independently using hands Standing Unsupported: Able to stand safely 2 minutes Sitting with Back Unsupported but Feet Supported on Floor or Stool: Able to sit safely and securely 2 minutes Stand to Sit: Sits safely with minimal use of hands Transfers: Able to transfer safely, definite need of hands Standing Unsupported with Eyes Closed: Able to stand 10 seconds with supervision Standing Ubsupported with Feet Together: Able to place feet together independently and stand for 1 minute with supervision From Standing, Reach Forward with Outstretched Arm: Can reach forward >12 cm safely (5") From Standing Position, Pick up Object from Floor: Able to pick up shoe, needs supervision From Standing Position, Turn to Look Behind Over each Shoulder: Looks behind one side only/other side shows less weight shift Turn 360 Degrees:  Able to turn 360 degrees safely but slowly Standing Unsupported, Alternately Place Feet on Step/Stool: Able to stand independently and complete 8 steps >20 seconds Standing Unsupported, One Foot in Front: Able to plae foot ahead of the other independently and hold 30 seconds Standing on One Leg: Able to lift leg independently and hold 5-10 seconds Total Score: 44         Pertinent  Vitals/Pain Pain Assessment: 0-10 Pain Score: 7  Pain Location: Rt foot Pain Descriptors / Indicators: Aching;Discomfort Pain Intervention(s): Limited activity within patient's tolerance;Monitored during session;Repositioned    Home Living Family/patient expects to be discharged to:: Private residence Living Arrangements: Children Available Help at Discharge: Family Type of Home: House Home Access: Stairs to enter Entrance Stairs-Rails: Right Entrance Stairs-Number of Steps: 2 Home Layout: One level Home Equipment: Grab bars - tub/shower      Prior Function Level of Independence: Independent               Hand Dominance   Dominant Hand: Right    Extremity/Trunk Assessment   Upper Extremity Assessment Upper Extremity Assessment: Generalized weakness    Lower Extremity Assessment Lower Extremity Assessment: Generalized weakness    Cervical / Trunk Assessment Cervical / Trunk Assessment: Normal  Communication   Communication: No difficulties  Cognition Arousal/Alertness: Awake/alert Behavior During Therapy: WFL for tasks assessed/performed Overall Cognitive Status: Within Functional Limits for tasks assessed                                        General Comments      Exercises     Assessment/Plan    PT Assessment Patient needs continued PT services  PT Problem List Decreased strength;Decreased balance;Decreased activity tolerance;Decreased mobility;Decreased knowledge of use of DME       PT Treatment Interventions DME instruction;Gait training;Stair training;Functional mobility training;Therapeutic exercise;Therapeutic activities;Balance training;Patient/family education    PT Goals (Current goals can be found in the Care Plan section)  Acute Rehab PT Goals Patient Stated Goal: get back to normal mobility/independence PT Goal Formulation: With patient Time For Goal Achievement: 03/10/20 Potential to Achieve Goals: Good     Frequency Min 3X/week   Barriers to discharge        Co-evaluation               AM-PAC PT "6 Clicks" Mobility  Outcome Measure Help needed turning from your back to your side while in a flat bed without using bedrails?: None Help needed moving from lying on your back to sitting on the side of a flat bed without using bedrails?: None Help needed moving to and from a bed to a chair (including a wheelchair)?: A Little Help needed standing up from a chair using your arms (e.g., wheelchair or bedside chair)?: A Little Help needed to walk in hospital room?: A Little Help needed climbing 3-5 steps with a railing? : A Little 6 Click Score: 20    End of Session Equipment Utilized During Treatment: Gait belt Activity Tolerance: Patient tolerated treatment well Patient left: in chair;with call bell/phone within reach;with chair alarm set;with family/visitor present Nurse Communication: Mobility status PT Visit Diagnosis: Other abnormalities of gait and mobility (R26.89);Difficulty in walking, not elsewhere classified (R26.2)    Time: 1030-1053 PT Time Calculation (min) (ACUTE ONLY): 23 min   Charges:   PT Evaluation $PT Eval Low Complexity: 1 Low PT Treatments $Gait Training: 8-22  mins        Wynn Maudlin, DPT Acute Rehabilitation Services  Office [phone] Pager [phone]  02/25/2020 11:14 AM

## 2020-02-25 NOTE — Progress Notes (Signed)
PROGRESS NOTE  Margaret Lucas BBC:[id number] DOB: 23-Aug-1934 DOA: 02/23/2020 PCP: Irena Reichmann, DO   LOS: 0 days   Brief Narrative / Interim history: 85 year old female with DM 2, MS, HTN, vertigo, hyponatremia came into the hospital with nausea, vomiting diarrhea for couple days as well as lower abdominal cramping.  She was found to be hyponatremic with a sodium of 115 as well as evidence of a UTI.  She was recently started on chlorthalidone about a week prior to admission.  Subjective / 24h Interval events: She is feeling better this morning, much stronger  Assessment & Plan: Principal Problem Hyponatremia -Felt to be hypovolemic in the setting of diuretic use as well as nausea, vomiting diarrhea and poor p.o. intake.  She was started on normal saline and her sodium is improved to 126 this morning.  We will repeat sodium in the afternoon, probably time to stop the IV fluids if sodium stabilizes and continue to monitor, not stable for discharge until sodium gets higher  Active Problems Urinary tract infection -Continue ceftriaxone, urine cultures pending  Acute urinary retention -No issues today  Gastroenteritis -Improved, no longer has nausea and vomiting  Essential hypertension -Continue lisinopril, patient told me that she has had problems with her low heart rate with other medications and had difficult to control blood pressure at home at times in the 200s systolic.  Currently stable on lisinopril  Hypothyroidism -continue Synthroid   Scheduled Meds: . enoxaparin (LOVENOX) injection  40 mg Subcutaneous Q24H  . levothyroxine  100 mcg Oral QAC breakfast  . lisinopril  10 mg Oral Daily  . pantoprazole  40 mg Oral Daily   Continuous Infusions: . 0.9 % NaCl with KCl 20 mEq / L 50 mL/hr at 02/25/20 0911  . cefTRIAXone (ROCEPHIN)  IV 1 g (02/24/20 2030)   PRN Meds:.acetaminophen **OR** acetaminophen, meclizine, ondansetron **OR** ondansetron (ZOFRAN) IV  Diet Orders  (From admission, onward)    Start     Ordered   02/24/20 1021  Diet Carb Modified Fluid consistency: Thin; Room service appropriate? Yes  Diet effective now       Question Answer Comment  Diet-HS Snack? Nothing   Calorie Level Medium 1600-2000   Fluid consistency: Thin   Room service appropriate? Yes      02/24/20 1020          DVT prophylaxis: enoxaparin (LOVENOX) injection 40 mg Start: 02/24/20 1000     Code Status: Full Code  Family Communication: no family at bedside   Status is: Observation  The patient will require care spanning > 2 midnights and should be moved to inpatient because: Persistent severe electrolyte disturbances  Dispo: The patient is from: Home              Anticipated d/c is to: Home              Anticipated d/c date is: 2 days              Patient currently is not medically stable to d/c.  Consultants:  None   Procedures:  None   Microbiology  Urine cultures - Citrobacter  Antimicrobials: Ceftriaxone     Objective: Vitals:   02/24/20 0803 02/24/20 1232 02/24/20 2248 02/25/20 0414  BP: (!) 123/52 134/61 (!) 122/50 (!) 134/55  Pulse: (!) 57 61 60 (!) 55  Resp: 20 20 18    Temp: 98.1 F (36.7 C) (!) 97.5 F (36.4 C) (!) 97.4 F (36.3 C) 98 F (36.7 C)  TempSrc: Oral Oral  Oral  SpO2: 98% 100% 96% 97%  Weight:      Height:        Intake/Output Summary (Last 24 hours) at 02/25/2020 1215 Last data filed at 02/25/2020 1030 Gross per 24 hour  Intake 2040 ml  Output 2675 ml  Net -635 ml   Filed Weights   02/23/20 1610 02/24/20 0005  Weight: 57.2 kg 53.8 kg    Examination:  Constitutional: NAD Eyes: no scleral icterus ENMT: Mucous membranes are moist.  Neck: normal, supple Respiratory: clear to auscultation bilaterally, no wheezing, no crackles.  Cardiovascular: Regular rate and rhythm, no murmurs / rubs / gallops. No LE edema.  Abdomen: non distended, no tenderness. Bowel sounds positive.  Musculoskeletal: no clubbing /  cyanosis.  Skin: no rashes Neurologic: CN 2-12 grossly intact. Strength 5/5 in all 4.    Data Reviewed: I have independently reviewed following labs and imaging studies   CBC: Recent Labs  Lab 02/23/20 1804 02/24/20 0445  WBC 16.3* 10.3  NEUTROABS 14.1*  --   HGB 13.2 11.4*  HCT 36.7 31.8*  MCV 78.8* 80.3  PLT 322 256   Basic Metabolic Panel: Recent Labs  Lab 02/24/20 0652 02/24/20 1038 02/24/20 1714 02/24/20 2237 02/25/20 0438  NA 141  140 123* 122* 121* 126*  K 4.1  4.1 3.8 3.7 4.1 4.2  CL 105  105 90* 93* 93* 97*  CO2 27  26 23  21* 20* 23  GLUCOSE 104*  104* 99 119* 98 93  BUN 26*  26* 11 14 17 15   CREATININE 1.13*  1.14* 0.85 0.85 0.82 0.75  CALCIUM 8.8*  8.6* 8.8* 8.1* 8.0* 8.5*   Liver Function Tests: Recent Labs  Lab 02/23/20 1919 02/24/20 0445  AST 40 50*  ALT 34 33  ALKPHOS 67 58  BILITOT 0.8 0.7  PROT 7.2 6.3*  ALBUMIN 4.0 3.4*   Coagulation Profile: No results for input(s): INR, PROTIME in the last 168 hours. HbA1C: No results for input(s): HGBA1C in the last 72 hours. CBG: No results for input(s): GLUCAP in the last 168 hours.  Recent Results (from the past 240 hour(s))  Urine culture     Status: Abnormal (Preliminary result)   Collection Time: 02/23/20  7:19 PM   Specimen: Urine, Random  Result Value Ref Range Status   Specimen Description   Final    URINE, RANDOM Performed at [facility], [address] [place], [address] [place]    Special Requests   Final    NONE Performed at [facility], [address] [place], [address] [place]    Culture (A)  Final    40,000 COLONIES/mL CITROBACTER SPECIES SUSCEPTIBILITIES TO FOLLOW Performed at [facility], [address], [place], [address] [place]    Report Status PENDING  Incomplete  Respiratory Panel by RT PCR (Flu A&B, Covid) - Nasopharyngeal Swab     Status: None   Collection Time: 02/23/20  9:54 PM   Specimen: Nasopharyngeal Swab  Result  Value Ref Range Status   SARS Coronavirus 2 by RT PCR NEGATIVE NEGATIVE Final    Comment: (NOTE) SARS-CoV-2 target nucleic acids are NOT DETECTED.  The SARS-CoV-2 RNA is generally detectable in upper respiratoy specimens during the acute phase of infection. The lowest concentration of SARS-CoV-2 viral copies this assay can detect is 131 copies/mL. A negative result does not preclude SARS-Cov-2 infection and should not be used as the sole basis for  treatment or other patient management decisions. A negative result may occur with  improper specimen collection/handling, submission of specimen other than nasopharyngeal swab, presence of viral mutation(s) within the areas targeted by this assay, and inadequate number of viral copies (<131 copies/mL). A negative result must be combined with clinical observations, patient history, and epidemiological information. The expected result is Negative.  Fact Sheet for Patients:  https://www.moore.com/  Fact Sheet for Healthcare Providers:  https://www.young.biz/  This test is no t yet approved or cleared by the [place] FDA and  has been authorized for detection and/or diagnosis of SARS-CoV-2 by FDA under an Emergency Use Authorization (EUA). This EUA will remain  in effect (meaning this test can be used) for the duration of the COVID-19 declaration under Section 564(b)(1) of the Act, 21 U.S.C. section 360bbb-3(b)(1), unless the authorization is terminated or revoked sooner.     Influenza A by PCR NEGATIVE NEGATIVE Final   Influenza B by PCR NEGATIVE NEGATIVE Final    Comment: (NOTE) The Xpert Xpress SARS-CoV-2/FLU/RSV assay is intended as an aid in  the diagnosis of influenza from Nasopharyngeal swab specimens and  should not be used as a sole basis for treatment. Nasal washings and  aspirates are unacceptable for Xpert Xpress SARS-CoV-2/FLU/RSV  testing.  Fact Sheet for  Patients: https://www.moore.com/  Fact Sheet for Healthcare Providers: https://www.young.biz/  This test is not yet approved or cleared by the [place] FDA and  has been authorized for detection and/or diagnosis of SARS-CoV-2 by  FDA under an Emergency Use Authorization (EUA). This EUA will remain  in effect (meaning this test can be used) for the duration of the  Covid-19 declaration under Section 564(b)(1) of the Act, 21  U.S.C. section 360bbb-3(b)(1), unless the authorization is  terminated or revoked. Performed at [facility], [address], [place], [place] [address]      Radiology Studies: No results found.   Pamella Pert, MD, PhD [facility]  Between 7 am - 7 pm I am available, please contact me via Amion or Securechat  Between 7 pm - 7 am I am not available, please contact night coverage MD/APP via Amion

## 2020-02-26 DIAGNOSIS — I10 Essential (primary) hypertension: Secondary | ICD-10-CM | POA: Diagnosis not present

## 2020-02-26 DIAGNOSIS — E871 Hypo-osmolality and hyponatremia: Secondary | ICD-10-CM | POA: Diagnosis not present

## 2020-02-26 DIAGNOSIS — I350 Nonrheumatic aortic (valve) stenosis: Secondary | ICD-10-CM | POA: Diagnosis not present

## 2020-02-26 DIAGNOSIS — R8271 Bacteriuria: Secondary | ICD-10-CM

## 2020-02-26 DIAGNOSIS — E039 Hypothyroidism, unspecified: Secondary | ICD-10-CM

## 2020-02-26 LAB — CBC
HCT: 30.7 % — ABNORMAL LOW (ref 36.0–46.0)
Hemoglobin: 10.5 g/dL — ABNORMAL LOW (ref 12.0–15.0)
MCH: 28.7 pg (ref 26.0–34.0)
MCHC: 34.2 g/dL (ref 30.0–36.0)
MCV: 83.9 fL (ref 80.0–100.0)
Platelets: 240 10*3/uL (ref 150–400)
RBC: 3.66 MIL/uL — ABNORMAL LOW (ref 3.87–5.11)
RDW: 13.6 % (ref 11.5–15.5)
WBC: 8.6 10*3/uL (ref 4.0–10.5)
nRBC: 0 % (ref 0.0–0.2)

## 2020-02-26 LAB — BASIC METABOLIC PANEL
Anion gap: 4 — ABNORMAL LOW (ref 5–15)
BUN: 13 mg/dL (ref 8–23)
CO2: 25 mmol/L (ref 22–32)
Calcium: 8.7 mg/dL — ABNORMAL LOW (ref 8.9–10.3)
Chloride: 101 mmol/L (ref 98–111)
Creatinine, Ser: 0.7 mg/dL (ref 0.44–1.00)
GFR, Estimated: >60 mL/min (ref >60)
Glucose, Bld: 99 mg/dL (ref 70–99)
Potassium: 4.3 mmol/L (ref 3.5–5.1)
Sodium: 130 mmol/L — ABNORMAL LOW (ref 135–145)

## 2020-02-26 MED ORDER — INDOMETHACIN 25 MG PO CAPS
25.0000 mg | ORAL_CAPSULE | Freq: Two times a day (BID) | ORAL | Status: DC
  Administered 2020-02-26: 25 mg via ORAL
  Filled 2020-02-26: qty 1

## 2020-02-26 MED ORDER — INDOMETHACIN 25 MG PO CAPS: 25.0000 mg | ORAL_CAPSULE | Freq: Two times a day (BID) | ORAL | Status: DC

## 2020-02-26 NOTE — Progress Notes (Signed)
AVS given to patient and explained at the bedside. Medications and follow up appointments have been explained with pt verbalizing understanding.  

## 2020-02-26 NOTE — Discharge Summary (Signed)
Physician Discharge Summary  Margaret CapronRebecca S Stallings ZOX:[id number] DOB: December 31, 1934 DOA: 02/23/2020  PCP: Irena Reichmannollins, Dana, DO  Admit date: 02/23/2020 Discharge date: 02/26/2020  Admitted From: home Disposition:  home  Recommendations for Outpatient Follow-up:  1. Follow up with PCP in 1 week. Please repeat a BMP 2. Follow up with Dr SwazilandJordan for BP check in 1-2 weeks  Home Health: none Equipment/Devices: none  Discharge Condition: stable CODE STATUS: Full code Diet recommendation: regular  HPI: Per admitting MD, Margaret Lucas is a 85 y.o. female with medical history significant of type 2 diabetes, aortic stenosis, hypertension, osteoporosis, vertigo, history of Mallory-Weiss syndrome who is [facility] she presented with complaints of having vertigo for the past 2 days associated with nausea, vomiting and diarrhea.  The patient mentions that she did not feel very well yesterday and was feeling nauseous.  She thought she had something in one of her tonsils, took an Careers adviserAllegra and Mucinex tablet.  She continued to be nauseous through the night associated with abdominal cramping that kept her up most of the night.  She had a bowel movement in the morning which provided some temporary relief. The patient took a meclizine 25 mg p.o. without significant improvement.  However, later in the morning and through the day she had at least 5 episodes of emesis and 4 or 5 episodes of diarrhea.  She denies fever, chills, night sweats, productive cough, wheezing or hemoptysis.  No chest pain, palpitations, diaphoresis, PND, orthopnea or recent pitting edema of the lower extremities.  She denies constipation, melena or hematochezia.  No dysuria, frequency, hematuria or flank pain.  She denies polyuria, polydipsia, polyphagia or blurred vision.  Hospital Course / Discharge diagnoses: Principal Problem Hyponatremia -Felt to be hypovolemic in the setting of diuretic use as well as  nausea, vomiting diarrhea and poor p.o. intake.    She was started on normal saline and her sodium gradually improved from 115 on admission to 130.  She has had few episodes of significant hyponatremia in the past as well and she appears to be very sensitive to medications/GI issues.  She is back to baseline, will be discharged home in stable condition and off of her chlorthalidone.  Her blood pressure is controlled and will resume her other home medications on discharge. Recommend follow-up with PCP within a week for repeat blood work  Active Problems Urinary tract infection -completed 3 days of Ceftriaxone while hospitalized Acute urinary retention -No issues today Gastroenteritis -Improved, no longer has nausea and vomiting Essential hypertension -Continue lisinopril, patient told me that she has had problems with her low heart rate with other medications and had difficult to control blood pressure at home at times in the 200s systolic.  Currently stable on lisinopril. Recommend outpatient follow up.  Hypothyroidism -continue Synthroid Right foot pain - she has been ambulating in the hospital without her shoe inserts. No swelling, redness, able to work with physical therapy. No history of gout.  Discharge Instructions   Allergies as of 02/26/2020      Reactions   Demerol [meperidine] Other (See Comments)   unknown      Medication List    STOP taking these medications   chlorthalidone 25 MG tablet Commonly known as: HYGROTON     TAKE these medications   Calcium 600+D 600-200 MG-UNIT Tabs Generic drug: Calcium Carbonate-Vitamin D Take 1 tablet by mouth daily.   cholecalciferol 1000 units tablet Commonly known as: VITAMIN D Take 5,000  Units by mouth daily.   fexofenadine 180 MG tablet Commonly known as: ALLEGRA Take 180 mg by mouth daily.   fluticasone 50 MCG/ACT nasal spray Commonly known as: FLONASE Place 1-2 sprays into both nostrils daily.   ICAPS AREDS FORMULA  PO Take 1 tablet by mouth 2 (two) times daily.   levothyroxine 100 MCG tablet Commonly known as: SYNTHROID Take 100 mcg by mouth daily before breakfast.   lisinopril 10 MG tablet Commonly known as: ZESTRIL Take 10 mg by mouth daily.   meclizine 25 MG tablet Commonly known as: ANTIVERT Take 1 tablet (25 mg total) by mouth 3 (three) times daily as needed for dizziness.   pantoprazole 40 MG tablet Commonly known as: Protonix Take 1 tablet (40 mg total) by mouth 2 (two) times daily. What changed: when to take this        Consultations:  None   Procedures/Studies:  DG Chest 1 View  Result Date: 02/23/2020 CLINICAL DATA:  Vertigo for 2 days, nausea and vomiting EXAM: CHEST  1 VIEW COMPARISON:  07/15/2016 FINDINGS: The heart size and mediastinal contours are within normal limits. Both lungs are clear. The visualized skeletal structures are unremarkable. IMPRESSION: No active disease. Electronically Signed   By: Sharlet Salina M.D.   On: 02/23/2020 22:00     Subjective: - no chest pain, shortness of breath, no abdominal pain, nausea or vomiting.   Discharge Exam: BP (!) 138/58 (BP Location: Right Arm)   Pulse 60   Temp 98 F (36.7 C) (Oral)   Resp 19   Ht 5\' 3"  (1.6 m)   Wt 53.8 kg   SpO2 99%   BMI 21.01 kg/m   General: Pt is alert, awake, not in acute distress Cardiovascular: RRR, S1/S2 +, no rubs, no gallops Respiratory: CTA bilaterally, no wheezing, no rhonchi Abdominal: Soft, NT, ND, bowel sounds + Extremities: no edema, no cyanosis    The results of significant diagnostics from this hospitalization (including imaging, microbiology, ancillary and laboratory) are listed below for reference.     Microbiology: Recent Results (from the past 240 hour(s))  Urine culture     Status: Abnormal (Preliminary result)   Collection Time: 02/23/20  7:19 PM   Specimen: Urine, Random  Result Value Ref Range Status   Specimen Description   Final    URINE,  RANDOM Performed at [facility], [address] [place], [place]    Special Requests   Final    NONE Performed at [facility], [address] [place], [place]    Culture (A)  Final    40,000 COLONIES/mL CITROBACTER SPECIES SUSCEPTIBILITIES TO FOLLOW Performed at [facility], [address], [place], [place]    Report Status PENDING  Incomplete  Respiratory Panel by RT PCR (Flu A&B, Covid) - Nasopharyngeal Swab     Status: None   Collection Time: 02/23/20  9:54 PM   Specimen: Nasopharyngeal Swab  Result Value Ref Range Status   SARS Coronavirus 2 by RT PCR NEGATIVE NEGATIVE Final    Comment: (NOTE) SARS-CoV-2 target nucleic acids are NOT DETECTED.  The SARS-CoV-2 RNA is generally detectable in upper respiratoy specimens during the acute phase of infection. The lowest concentration of SARS-CoV-2 viral copies this assay can detect is 131 copies/mL. A negative result does not preclude SARS-Cov-2 infection and should not be used as the sole basis for treatment or other patient management decisions. A negative result may occur  with  improper specimen collection/handling, submission of specimen other than nasopharyngeal swab, presence of viral mutation(s) within the areas targeted by this assay, and inadequate number of viral copies (<131 copies/mL). A negative result must be combined with clinical observations, patient history, and epidemiological information. The expected result is Negative.  Fact Sheet for Patients:  https://www.moore.com/  Fact Sheet for Healthcare Providers:  https://www.young.biz/  This test is no t yet approved or cleared by the [place] FDA and  has been authorized for detection and/or diagnosis of SARS-CoV-2 by FDA under an Emergency Use Authorization (EUA). This EUA will remain  in effect (meaning this test can be used) for the duration of  the COVID-19 declaration under Section 564(b)(1) of the Act, 21 U.S.C. section 360bbb-3(b)(1), unless the authorization is terminated or revoked sooner.     Influenza A by PCR NEGATIVE NEGATIVE Final   Influenza B by PCR NEGATIVE NEGATIVE Final    Comment: (NOTE) The Xpert Xpress SARS-CoV-2/FLU/RSV assay is intended as an aid in  the diagnosis of influenza from Nasopharyngeal swab specimens and  should not be used as a sole basis for treatment. Nasal washings and  aspirates are unacceptable for Xpert Xpress SARS-CoV-2/FLU/RSV  testing.  Fact Sheet for Patients: https://www.moore.com/  Fact Sheet for Healthcare Providers: https://www.young.biz/  This test is not yet approved or cleared by the [place] FDA and  has been authorized for detection and/or diagnosis of SARS-CoV-2 by  FDA under an Emergency Use Authorization (EUA). This EUA will remain  in effect (meaning this test can be used) for the duration of the  Covid-19 declaration under Section 564(b)(1) of the Act, 21  U.S.C. section 360bbb-3(b)(1), unless the authorization is  terminated or revoked. Performed at [facility], [address] [place], [place] [address]      Labs: Basic Metabolic Panel: Recent Labs  Lab 02/24/20 1714 02/24/20 2237 02/25/20 0438 02/25/20 1533 02/26/20 0435  NA 122* 121* 126* 126* 130*  K 3.7 4.1 4.2 4.2 4.3  CL 93* 93* 97* 97* 101  CO2 21* 20* 23 22 25   GLUCOSE 119* 98 93 126* 99  BUN 14 17 15 16 13   CREATININE 0.85 0.82 0.75 0.68 0.70  CALCIUM 8.1* 8.0* 8.5* 8.4* 8.7*   Liver Function Tests: Recent Labs  Lab 02/23/20 1919 02/24/20 0445  AST 40 50*  ALT 34 33  ALKPHOS 67 58  BILITOT 0.8 0.7  PROT 7.2 6.3*  ALBUMIN 4.0 3.4*   CBC: Recent Labs  Lab 02/23/20 1804 02/24/20 0445 02/26/20 0435  WBC 16.3* 10.3 8.6  NEUTROABS 14.1*  --   --   HGB 13.2 11.4* 10.5*  HCT 36.7 31.8* 30.7*  MCV 78.8* 80.3 83.9  PLT 322  256 240   CBG: No results for input(s): GLUCAP in the last 168 hours. Hgb A1c No results for input(s): HGBA1C in the last 72 hours. Lipid Profile No results for input(s): CHOL, HDL, LDLCALC, TRIG, CHOLHDL, LDLDIRECT in the last 72 hours. Thyroid function studies No results for input(s): TSH, T4TOTAL, T3FREE, THYROIDAB in the last 72 hours.  Invalid input(s): FREET3 Urinalysis    Component Value Date/Time   COLORURINE YELLOW 02/23/2020 1919   APPEARANCEUR CLOUDY (A) 02/23/2020 1919   LABSPEC 1.015 02/23/2020 1919   PHURINE 7.0 02/23/2020 1919   GLUCOSEU NEGATIVE 02/23/2020 1919   HGBUR TRACE (A) 02/23/2020 1919   BILIRUBINUR NEGATIVE 02/23/2020 1919   KETONESUR NEGATIVE 02/23/2020 1919   PROTEINUR NEGATIVE 02/23/2020 1919  NITRITE NEGATIVE 02/23/2020 1919   LEUKOCYTESUR MODERATE (A) 02/23/2020 1919    FURTHER DISCHARGE INSTRUCTIONS:   Get Medicines reviewed and adjusted: Please take all your medications with you for your next visit with your Primary MD   Laboratory/radiological data: Please request your Primary MD to go over all hospital tests and procedure/radiological results at the follow up, please ask your Primary MD to get all Hospital records sent to his/her office.   In some cases, they will be blood work, cultures and biopsy results pending at the time of your discharge. Please request that your primary care M.D. goes through all the records of your hospital data and follows up on these results.   Also Note the following: If you experience worsening of your admission symptoms, develop shortness of breath, life threatening emergency, suicidal or homicidal thoughts you must seek medical attention immediately by calling 911 or calling your MD immediately  if symptoms less severe.   You must read complete instructions/literature along with all the possible adverse reactions/side effects for all the Medicines you take and that have been prescribed to you. Take any new  Medicines after you have completely understood and accpet all the possible adverse reactions/side effects.    Do not drive when taking Pain medications or sleeping medications (Benzodaizepines)   Do not take more than prescribed Pain, Sleep and Anxiety Medications. It is not advisable to combine anxiety,sleep and pain medications without talking with your primary care practitioner   Special Instructions: If you have smoked or chewed Tobacco  in the last 2 yrs please stop smoking, stop any regular Alcohol  and or any Recreational drug use.   Wear Seat belts while driving.   Please note: You were cared for by a hospitalist during your hospital stay. Once you are discharged, your primary care physician will handle any further medical issues. Please note that NO REFILLS for any discharge medications will be authorized once you are discharged, as it is imperative that you return to your primary care physician (or establish a relationship with a primary care physician if you do not have one) for your post hospital discharge needs so that they can reassess your need for medications and monitor your lab values.  Time coordinating discharge: 35 minutes  SIGNED:  Pamella Pert, MD, PhD 02/26/2020, 8:43 AM

## 2020-02-27 LAB — URINE CULTURE: Culture: 40000 — AB

## 2020-02-29 ENCOUNTER — Telehealth: Payer: Self-pay | Admitting: Cardiology

## 2020-02-29 NOTE — Telephone Encounter (Signed)
Spoke with pt on the phone.  Pt is following up from a hospital admission to which she was discharged last week on 11/12.  Pt was placed on a diuretic by Dr. Swaziland, about 2 weeks after the new medication pt admitted to hospital with hyponatremia. She would like to discuss medications, suggested to pt that we make a follow up appt with Dr. Swaziland.  I was looking for an appointment and we were disconnected, will follow up with pt.

## 2020-02-29 NOTE — Telephone Encounter (Signed)
Called pt back. Follow up appointment made with Dr. Swaziland.  Pt also states that she has made an appointment with her PCP for next Wednesday.

## 2020-02-29 NOTE — Telephone Encounter (Signed)
New message:      Patient calling stating that she was in the hospital and some medications was change and patient would like to speak with nurse.

## 2020-02-29 NOTE — Telephone Encounter (Signed)
Patient states she is returning a call. 

## 2020-03-02 DIAGNOSIS — Z09 Encounter for follow-up examination after completed treatment for conditions other than malignant neoplasm: Secondary | ICD-10-CM | POA: Diagnosis not present

## 2020-03-02 DIAGNOSIS — E039 Hypothyroidism, unspecified: Secondary | ICD-10-CM | POA: Diagnosis not present

## 2020-03-02 DIAGNOSIS — E119 Type 2 diabetes mellitus without complications: Secondary | ICD-10-CM | POA: Diagnosis not present

## 2020-03-02 DIAGNOSIS — M810 Age-related osteoporosis without current pathological fracture: Secondary | ICD-10-CM | POA: Diagnosis not present

## 2020-03-02 DIAGNOSIS — I350 Nonrheumatic aortic (valve) stenosis: Secondary | ICD-10-CM | POA: Diagnosis not present

## 2020-03-02 DIAGNOSIS — I10 Essential (primary) hypertension: Secondary | ICD-10-CM | POA: Diagnosis not present

## 2020-03-02 DIAGNOSIS — E871 Hypo-osmolality and hyponatremia: Secondary | ICD-10-CM | POA: Diagnosis not present

## 2020-03-02 DIAGNOSIS — E1122 Type 2 diabetes mellitus with diabetic chronic kidney disease: Secondary | ICD-10-CM | POA: Diagnosis not present

## 2020-03-02 DIAGNOSIS — Z8719 Personal history of other diseases of the digestive system: Secondary | ICD-10-CM | POA: Diagnosis not present

## 2020-03-16 DIAGNOSIS — H353231 Exudative age-related macular degeneration, bilateral, with active choroidal neovascularization: Secondary | ICD-10-CM | POA: Diagnosis not present

## 2020-03-19 NOTE — Progress Notes (Signed)
Cardiology Office Note   Date:  03/25/2020   ID:  Margaret Lucas, DOB 11/20/1934, MRN [id number]  PCP:  Irena Reichmann, DO  Cardiologist:   Keevan Wolz Swaziland, MD   Chief Complaint  Patient presents with  . Hypertension  . Aortic Stenosis      History of Present Illness: Margaret Lucas is a 85 y.o. female who is seen for follow up of aortic stenosis.  She has a history of HTN and DM- diet controlled. She mentions she has a murmur dating back 4-5 years. She had an Echo in 2014 showing some AV sclerosis. Repeat in 2017 showed mild AS but valve velocity was really unchanged.   She was admitted in November with N/V, dehydration, UTI and hyponatremia. HCT held. She reports follow up sodium improved to 133. She brings BP diary with her today and readings are from 133-145 systolic and 65-77 diastolic. She is feeling well.    Past Medical History:  Diagnosis Date  . Diabetes mellitus without complication (HCC)   . Heart murmur   . Hypertension   . 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 Osteoporosis   . Vertigo     Past Surgical History:  Procedure Laterality Date  . CHOLECYSTECTOMY    . COLONOSCOPY  2010 last  . ESOPHAGOGASTRODUODENOSCOPY N/A 08/08/2016   Procedure: ESOPHAGOGASTRODUODENOSCOPY (EGD);  Surgeon: Iva Boop, MD;  Location: [facility] ENDOSCOPY;  Service: Endoscopy;  Laterality: N/A;  . 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   . TUBAL LIGATION       Current Outpatient Medications  Medication Sig Dispense Refill  . amLODipine (NORVASC) 10 MG tablet Take 10 mg by mouth daily.    . cholecalciferol (VITAMIN D) 1000 units tablet Take 5,000 Units by mouth daily.    . fexofenadine (ALLEGRA) 180 MG tablet Take 180 mg by mouth daily.    . fluticasone (FLONASE) 50 MCG/ACT nasal spray Place 1-2 sprays into both nostrils daily.    Marland Kitchen levothyroxine (SYNTHROID, LEVOTHROID) 100 MCG tablet Take 100 mcg by mouth daily before breakfast.     . lisinopril (ZESTRIL) 40 MG tablet Take 40 mg by mouth daily.    . meclizine (ANTIVERT) 25 MG tablet Take 1 tablet (25 mg total) by mouth 3 (three) times daily as needed for dizziness. 30 tablet 0  . Multiple Vitamins-Minerals (ICAPS AREDS FORMULA PO) Take 1 tablet by mouth 2 (two) times daily.    . pantoprazole (PROTONIX) 40 MG tablet Take 1 tablet (40 mg total) by mouth 2 (two) times daily. (Patient taking differently: Take 40 mg by mouth daily.) 60 tablet 0   No current facility-administered medications for this visit.    Allergies:   Demerol [meperidine]    Social History:  The patient  reports that she has never smoked. She has never used smokeless tobacco. She reports that she does not drink alcohol and does not use drugs.   Family History:  The patient's family history includes Aortic stenosis in her brother; Breast cancer in her sister; Cancer in her sister; Kidney failure in her brother; Pneumonia in her father; Stroke in her mother.    ROS:  Please see the history of present illness.   Otherwise, review of systems are positive for none.   All other systems are reviewed and negative.    PHYSICAL EXAM: VS:  BP (!) 171/73   Pulse 72   Ht 5\' 3"  (1.6 m)  Wt 120 lb (54.4 kg)   BMI 21.26 kg/m  , BMI Body mass index is 21.26 kg/m. GENERAL:  Well appearing WF in NAD HEENT:  PERRL, EOMI, sclera are clear. Oropharynx is clear. NECK:  No jugular venous distention, carotid upstroke brisk and symmetric, no bruits, no thyromegaly or adenopathy LUNGS:  Clear to auscultation bilaterally CHEST:  Unremarkable HEART:  RRR,  PMI not displaced or sustained, grade 2/6 harsh systolic murmur RUSB. S1 and S2 within normal limits, no S3, no S4: no clicks, no rubs ABD:  Soft, nontender. BS +, no masses or bruits. No hepatomegaly, no splenomegaly EXT:  2 + pulses throughout, no edema, no cyanosis no clubbing SKIN:  Warm and dry.  No rashes NEURO:  Alert and oriented x 3. Cranial nerves II  through XII intact. PSYCH:  Cognitively intact     EKG:  EKG is not ordered today. The ekg ordered 02/23/20 demonstrates NSR RBBB. I have personally reviewed and interpreted this study.    Recent Labs: 02/24/2020: ALT 33 02/26/2020: BUN 13; Creatinine, Ser 0.70; Hemoglobin 10.5; Platelets 240; Potassium 4.3; Sodium 130    Lipid Panel No results found for: CHOL, TRIG, HDL, CHOLHDL, VLDL, LDLCALC, LDLDIRECT   Labs dated 06/03/17: cholesterol 232, triglycerides 158, HDL 47, LDL 153. A1c 6.2%. Potassium 5.5. Creatinine 0.74 Dated 05/20/18: cholesterol 218, triglycerides 121, HDL 54, LDL 140. CMET and TSH normal.  Wt Readings from Last 3 Encounters:  03/25/20 120 lb (54.4 kg)  02/24/20 118 lb 9.7 oz (53.8 kg)  05/04/19 127 lb 9.6 oz (57.9 kg)      Other studies Reviewed: Additional studies/ records that were reviewed today include:  Echo 02/22/16: Study Conclusions  - Left ventricle: The cavity size was normal. Wall thickness was   normal. Systolic function was normal. The estimated ejection   fraction was in the range of 60% to 65%. Wall motion was normal;   there were no regional wall motion abnormalities. Doppler   parameters are consistent with abnormal left ventricular   relaxation (grade 1 diastolic dysfunction). - Aortic valve: There was mild stenosis. There was mild   regurgitation. - Mitral valve: Calcified annulus. There was mild regurgitation   ASSESSMENT AND PLAN:  1.  Mild Aortic stenosis. Her exam is unchanged from prior. Echo in 2017 showed only mild stenosis. She is asymptomatic. Risk is low. I have recommended follow up Echo.  2. RBBB.  3. HTN. Elevated today but according to her diary BP has been well controlled. She is on maximal therapy with lisinopril and amlodipine. I would avoid diuretics given hyponatremia. If additional medication needed would recommend hydralazine.  4. Hypercholesterolemia. Managed with diet.    Current medicines are reviewed at  length with the patient today.  The patient does not have concerns regarding medicines.  The following changes have been made:  no change  Labs/ tests ordered today include: none  No orders of the defined types were placed in this encounter.    Disposition:   FU with me in 1 year  Signed, Christean Silvestri Swaziland, MD  03/25/2020 9:33 AM    [facility] [address], [place], [place], [address] Phone [phone], Fax [phone]

## 2020-03-25 ENCOUNTER — Ambulatory Visit: Payer: Medicare PPO | Admitting: Cardiology

## 2020-03-25 ENCOUNTER — Encounter: Payer: Self-pay | Admitting: Cardiology

## 2020-03-25 ENCOUNTER — Other Ambulatory Visit: Payer: Self-pay

## 2020-03-25 VITALS — BP 171/73 | HR 72 | Ht 63.0 in | Wt 120.0 lb

## 2020-03-25 DIAGNOSIS — I10 Essential (primary) hypertension: Secondary | ICD-10-CM

## 2020-03-25 DIAGNOSIS — I350 Nonrheumatic aortic (valve) stenosis: Secondary | ICD-10-CM | POA: Diagnosis not present

## 2020-03-25 NOTE — Patient Instructions (Signed)
We will schedule you for an Echocardiogram   

## 2020-04-18 ENCOUNTER — Other Ambulatory Visit (HOSPITAL_COMMUNITY): Payer: Medicare PPO

## 2020-04-27 DIAGNOSIS — H353231 Exudative age-related macular degeneration, bilateral, with active choroidal neovascularization: Secondary | ICD-10-CM | POA: Diagnosis not present

## 2020-05-10 ENCOUNTER — Other Ambulatory Visit: Payer: Self-pay

## 2020-05-10 ENCOUNTER — Ambulatory Visit (HOSPITAL_COMMUNITY): Payer: Medicare PPO | Attending: Internal Medicine

## 2020-05-10 DIAGNOSIS — I10 Essential (primary) hypertension: Secondary | ICD-10-CM | POA: Insufficient documentation

## 2020-05-10 DIAGNOSIS — I350 Nonrheumatic aortic (valve) stenosis: Secondary | ICD-10-CM | POA: Insufficient documentation

## 2020-05-10 LAB — ECHOCARDIOGRAM COMPLETE
AR max vel: 1.55 cm2
AV Area VTI: 1.65 cm2
AV Area mean vel: 1.56 cm2
AV Mean grad: 17 mmHg
AV Peak grad: 30.6 mmHg
Ao pk vel: 2.76 m/s
Area-P 1/2: 2.34 cm2
P 1/2 time: 440 msec
S' Lateral: 2 cm

## 2020-05-11 ENCOUNTER — Ambulatory Visit: Payer: Medicare PPO | Admitting: Cardiology

## 2020-05-12 DIAGNOSIS — M25532 Pain in left wrist: Secondary | ICD-10-CM | POA: Diagnosis not present

## 2020-05-12 DIAGNOSIS — M19032 Primary osteoarthritis, left wrist: Secondary | ICD-10-CM | POA: Diagnosis not present

## 2020-05-25 DIAGNOSIS — H353231 Exudative age-related macular degeneration, bilateral, with active choroidal neovascularization: Secondary | ICD-10-CM | POA: Diagnosis not present

## 2020-06-22 DIAGNOSIS — H353231 Exudative age-related macular degeneration, bilateral, with active choroidal neovascularization: Secondary | ICD-10-CM | POA: Diagnosis not present

## 2020-07-05 DIAGNOSIS — M810 Age-related osteoporosis without current pathological fracture: Secondary | ICD-10-CM | POA: Diagnosis not present

## 2020-07-05 DIAGNOSIS — Z0000 Encounter for general adult medical examination without abnormal findings: Secondary | ICD-10-CM | POA: Diagnosis not present

## 2020-07-05 DIAGNOSIS — E1122 Type 2 diabetes mellitus with diabetic chronic kidney disease: Secondary | ICD-10-CM | POA: Diagnosis not present

## 2020-07-05 DIAGNOSIS — E039 Hypothyroidism, unspecified: Secondary | ICD-10-CM | POA: Diagnosis not present

## 2020-07-05 DIAGNOSIS — I10 Essential (primary) hypertension: Secondary | ICD-10-CM | POA: Diagnosis not present

## 2020-07-12 DIAGNOSIS — H3530 Unspecified macular degeneration: Secondary | ICD-10-CM | POA: Diagnosis not present

## 2020-07-12 DIAGNOSIS — Z0000 Encounter for general adult medical examination without abnormal findings: Secondary | ICD-10-CM | POA: Diagnosis not present

## 2020-07-12 DIAGNOSIS — E785 Hyperlipidemia, unspecified: Secondary | ICD-10-CM | POA: Diagnosis not present

## 2020-07-12 DIAGNOSIS — H8110 Benign paroxysmal vertigo, unspecified ear: Secondary | ICD-10-CM | POA: Diagnosis not present

## 2020-07-12 DIAGNOSIS — I350 Nonrheumatic aortic (valve) stenosis: Secondary | ICD-10-CM | POA: Diagnosis not present

## 2020-07-12 DIAGNOSIS — E119 Type 2 diabetes mellitus without complications: Secondary | ICD-10-CM | POA: Diagnosis not present

## 2020-07-12 DIAGNOSIS — J309 Allergic rhinitis, unspecified: Secondary | ICD-10-CM | POA: Diagnosis not present

## 2020-07-12 DIAGNOSIS — E039 Hypothyroidism, unspecified: Secondary | ICD-10-CM | POA: Diagnosis not present

## 2020-07-12 DIAGNOSIS — Z789 Other specified health status: Secondary | ICD-10-CM | POA: Diagnosis not present

## 2020-07-13 ENCOUNTER — Other Ambulatory Visit: Payer: Self-pay | Admitting: Family Medicine

## 2020-07-13 DIAGNOSIS — Z1231 Encounter for screening mammogram for malignant neoplasm of breast: Secondary | ICD-10-CM

## 2020-07-27 DIAGNOSIS — H353231 Exudative age-related macular degeneration, bilateral, with active choroidal neovascularization: Secondary | ICD-10-CM | POA: Diagnosis not present

## 2020-08-31 DIAGNOSIS — H353231 Exudative age-related macular degeneration, bilateral, with active choroidal neovascularization: Secondary | ICD-10-CM | POA: Diagnosis not present

## 2020-09-02 ENCOUNTER — Other Ambulatory Visit: Payer: Self-pay

## 2020-09-02 ENCOUNTER — Ambulatory Visit
Admission: RE | Admit: 2020-09-02 | Discharge: 2020-09-02 | Disposition: A | Discharge status: Home / Self Care | Payer: Medicare PPO | Source: Ambulatory Visit | Attending: Family Medicine | Admitting: Family Medicine

## 2020-09-02 DIAGNOSIS — Z1231 Encounter for screening mammogram for malignant neoplasm of breast: Secondary | ICD-10-CM | POA: Diagnosis not present

## 2020-10-05 DIAGNOSIS — H353231 Exudative age-related macular degeneration, bilateral, with active choroidal neovascularization: Secondary | ICD-10-CM | POA: Diagnosis not present

## 2020-11-09 DIAGNOSIS — H353231 Exudative age-related macular degeneration, bilateral, with active choroidal neovascularization: Secondary | ICD-10-CM | POA: Diagnosis not present

## 2020-12-14 DIAGNOSIS — H353231 Exudative age-related macular degeneration, bilateral, with active choroidal neovascularization: Secondary | ICD-10-CM | POA: Diagnosis not present

## 2021-01-11 DIAGNOSIS — H353231 Exudative age-related macular degeneration, bilateral, with active choroidal neovascularization: Secondary | ICD-10-CM | POA: Diagnosis not present

## 2021-02-08 DIAGNOSIS — H2512 Age-related nuclear cataract, left eye: Secondary | ICD-10-CM | POA: Diagnosis not present

## 2021-02-14 DIAGNOSIS — E119 Type 2 diabetes mellitus without complications: Secondary | ICD-10-CM | POA: Diagnosis not present

## 2021-02-14 DIAGNOSIS — Z8719 Personal history of other diseases of the digestive system: Secondary | ICD-10-CM | POA: Diagnosis not present

## 2021-02-14 DIAGNOSIS — E039 Hypothyroidism, unspecified: Secondary | ICD-10-CM | POA: Diagnosis not present

## 2021-02-15 DIAGNOSIS — H353231 Exudative age-related macular degeneration, bilateral, with active choroidal neovascularization: Secondary | ICD-10-CM | POA: Diagnosis not present

## 2021-03-01 DIAGNOSIS — H2511 Age-related nuclear cataract, right eye: Secondary | ICD-10-CM | POA: Diagnosis not present

## 2021-03-14 DIAGNOSIS — H353231 Exudative age-related macular degeneration, bilateral, with active choroidal neovascularization: Secondary | ICD-10-CM | POA: Diagnosis not present

## 2021-04-12 DIAGNOSIS — H353231 Exudative age-related macular degeneration, bilateral, with active choroidal neovascularization: Secondary | ICD-10-CM | POA: Diagnosis not present

## 2021-05-10 DIAGNOSIS — H353231 Exudative age-related macular degeneration, bilateral, with active choroidal neovascularization: Secondary | ICD-10-CM | POA: Diagnosis not present

## 2021-05-25 NOTE — Progress Notes (Signed)
Cardiology Office Note   Date:  05/30/2021   ID:  Margaret Lucas, DOB May 09, 1934, MRN [id number]  PCP:  Irena Reichmann, DO  Cardiologist:   Adamari Frede Sw[place], MD   Chief Complaint  Patient presents with   Aortic Stenosis      History of Present Illness: Margaret Lucas is a 87 y.o. female who is seen for follow up of aortic stenosis.  She has a history of HTN and DM- diet controlled. She mentions she has a murmur dating back several  years. She had an Echo in 2014 showing some AV sclerosis. Repeat in 2017 showed mild AS but valve velocity was really unchanged. Echo in Jan 2022 showed mild AS without change.   She was admitted in November 2021 with N/V, dehydration, UTI and hyponatremia. HCT held. Follow up sodium level improved.    On follow up today she is doing well. Notes some discomfort between her shoulder blades. Some aching in right arm. Not associated with activity  BP has been well controlled.    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    TUBAL LIGATION       Current Outpatient Medications  Medication Sig Dispense Refill   amLODipine (NORVASC) 10 MG tablet Take 10 mg by mouth daily.     cholecalciferol (VITAMIN D) 1000 units tablet Take 5,000 Units by mouth daily.     fexofenadine (ALLEGRA) 180 MG tablet Take 180 mg by mouth as needed.     fluticasone (FLONASE) 50 MCG/ACT nasal spray Place 1-2 sprays into both nostrils as needed.     levothyroxine  (SYNTHROID, LEVOTHROID) 100 MCG tablet Take 100 mcg by mouth daily before breakfast.     lisinopril (ZESTRIL) 40 MG tablet Take 40 mg by mouth in the morning and at bedtime.     meclizine (ANTIVERT) 25 MG tablet Take 1 tablet (25 mg total) by mouth 3 (three) times daily as needed for dizziness. 30 tablet 0   Multiple Vitamins-Minerals (ICAPS AREDS FORMULA PO) Take 1 tablet by mouth 2 (two) times daily.     pantoprazole (PROTONIX) 40 MG tablet Take 1 tablet (40 mg total) by mouth 2 (two) times daily. (Patient taking differently: Take 40 mg by mouth daily.) 60 tablet 0   No current facility-administered medications for this visit.    Allergies:   Chlorthalidone and Demerol [meperidine]    Social History:  The patient  reports that she has never smoked. She has never used smokeless tobacco. She reports that she does not drink alcohol and does not use drugs.   Family History:  The patient's family history includes Aortic stenosis in her brother; Breast cancer (age of onset: 11) in her sister; Cancer in her sister; Kidney failure in her brother; Pneumonia in her father; Stroke in her mother.    ROS:  Please see the history of present illness.   Otherwise, review of systems are positive for none.  All other systems are reviewed and negative.    PHYSICAL EXAM: VS:  BP 134/68    Pulse 64    Ht 5\' 3"  (1.6 m)    Wt 129 lb 9.6 oz (58.8 kg)    SpO2 99%    BMI 22.96 kg/m  , BMI Body mass index is 22.96 kg/m. GENERAL:  Well appearing WF in NAD HEENT:  PERRL, EOMI, sclera are clear. Oropharynx is clear. NECK:  No jugular venous distention, carotid upstroke brisk and symmetric, no bruits, no thyromegaly or adenopathy LUNGS:  Clear to auscultation bilaterally CHEST:  Unremarkable HEART:  RRR,  PMI not displaced or sustained, grade 2/6 harsh systolic murmur RUSB. S1 and S2 within normal limits, no S3, no S4: no clicks, no rubs ABD:  Soft, nontender. BS +, no masses or bruits. No hepatomegaly, no  splenomegaly EXT:  2 + pulses throughout, no edema, no cyanosis no clubbing SKIN:  Warm and dry.  No rashes NEURO:  Alert and oriented x 3. Cranial nerves II through XII intact. PSYCH:  Cognitively intact     EKG:  EKG is not ordered today. The ekg ordered 02/23/20 demonstrates NSR RBBB.rate 64 bpm.  I have personally reviewed and interpreted this study.    Recent Labs: No results found for requested labs within last 8760 hours.    Lipid Panel No results found for: CHOL, TRIG, HDL, CHOLHDL, VLDL, LDLCALC, LDLDIRECT   Labs dated 06/03/17: cholesterol 232, triglycerides 158, HDL 47, LDL 153. A1c 6.2%. Potassium 5.5. Creatinine 0.74 Dated 05/20/18: cholesterol 218, triglycerides 121, HDL 54, LDL 140. CMET and TSH normal.  Wt Readings from Last 3 Encounters:  05/30/21 129 lb 9.6 oz (58.8 kg)  03/25/20 120 lb (54.4 kg)  02/24/20 118 lb 9.7 oz (53.8 kg)      Other studies Reviewed: Additional studies/ records that were reviewed today include:  Echo 02/22/16: Study Conclusions   - Left ventricle: The cavity size was normal. Wall thickness was   normal. Systolic function was normal. The estimated ejection   fraction was in the range of 60% to 65%. Wall motion was normal;   there were no regional wall motion abnormalities. Doppler   parameters are consistent with abnormal left ventricular   relaxation (grade 1 diastolic dysfunction). - Aortic valve: There was mild stenosis. There was mild   regurgitation. - Mitral valve: Calcified annulus. There was mild regurgitation  Echo 05/10/20: IMPRESSIONS     1. Left ventricular ejection fraction, by estimation, is 60 to 65%. The  left ventricle has normal function. The left ventricle has no regional  wall motion abnormalities. Left ventricular diastolic parameters are  consistent with Grade I diastolic  dysfunction (impaired relaxation).   2. Right ventricular systolic function is normal. The right ventricular  size is normal.   3. The  mitral valve is grossly normal. Trivial mitral valve  regurgitation.   4. The aortic valve is tricuspid. There is moderate calcification of the  aortic valve. Aortic valve regurgitation is mild to moderate. Mild aortic  valve stenosis. Aortic valve area, by VTI measures 1.65 cm. Aortic valve  mean gradient measures 17.0  mmHg. Aortic valve Vmax measures 2.76 m/s.   Comparison(s): A prior study was performed on 02/22/2016. Similar aortic  gradients with slight increase in aortic valve calcification. Largest mean  gradient acquired 22 mm Hg.   ASSESSMENT AND PLAN:  1.  Mild Aortic stenosis. Her exam is unchanged from prior. Echo in 2017 showed only mild stenosis. Stable  by Echo in Jan 2022. She is asymptomatic. Risk is low.  2. RBBB. Chronic. 3. HTN. Well controlled.  4. Hypercholesterolemia. Managed with diet.  Plans follow up labs with primary care in April.  Current medicines are reviewed at length with the patient today.  The patient does not have concerns regarding medicines.  The following changes have been made:  no change  Labs/ tests ordered today include: none  No orders of the defined types were placed in this encounter.    Disposition:   FU with me in 1 year  Signed, Sabian Kuba [place], MD  05/30/2021 2:28 PM    [facility] [address] [place], [place], [address] Phone [phone], Fax [phone]

## 2021-05-30 ENCOUNTER — Ambulatory Visit: Payer: Medicare PPO | Admitting: Cardiology

## 2021-05-30 ENCOUNTER — Other Ambulatory Visit: Payer: Self-pay

## 2021-05-30 ENCOUNTER — Encounter: Payer: Self-pay | Admitting: Cardiology

## 2021-05-30 VITALS — BP 134/68 | HR 64 | Ht 63.0 in | Wt 129.6 lb

## 2021-05-30 DIAGNOSIS — I10 Essential (primary) hypertension: Secondary | ICD-10-CM | POA: Diagnosis not present

## 2021-05-30 DIAGNOSIS — I350 Nonrheumatic aortic (valve) stenosis: Secondary | ICD-10-CM

## 2021-06-07 DIAGNOSIS — H353231 Exudative age-related macular degeneration, bilateral, with active choroidal neovascularization: Secondary | ICD-10-CM | POA: Diagnosis not present

## 2021-07-12 DIAGNOSIS — H353231 Exudative age-related macular degeneration, bilateral, with active choroidal neovascularization: Secondary | ICD-10-CM | POA: Diagnosis not present

## 2021-07-20 ENCOUNTER — Other Ambulatory Visit: Payer: Self-pay | Admitting: Family Medicine

## 2021-07-20 DIAGNOSIS — Z1231 Encounter for screening mammogram for malignant neoplasm of breast: Secondary | ICD-10-CM

## 2021-07-25 DIAGNOSIS — I10 Essential (primary) hypertension: Secondary | ICD-10-CM | POA: Diagnosis not present

## 2021-07-25 DIAGNOSIS — E1122 Type 2 diabetes mellitus with diabetic chronic kidney disease: Secondary | ICD-10-CM | POA: Diagnosis not present

## 2021-07-25 DIAGNOSIS — E785 Hyperlipidemia, unspecified: Secondary | ICD-10-CM | POA: Diagnosis not present

## 2021-07-25 DIAGNOSIS — E039 Hypothyroidism, unspecified: Secondary | ICD-10-CM | POA: Diagnosis not present

## 2021-08-01 DIAGNOSIS — Z0000 Encounter for general adult medical examination without abnormal findings: Secondary | ICD-10-CM | POA: Diagnosis not present

## 2021-08-01 DIAGNOSIS — Z23 Encounter for immunization: Secondary | ICD-10-CM | POA: Diagnosis not present

## 2021-08-01 DIAGNOSIS — K219 Gastro-esophageal reflux disease without esophagitis: Secondary | ICD-10-CM | POA: Diagnosis not present

## 2021-08-01 DIAGNOSIS — E039 Hypothyroidism, unspecified: Secondary | ICD-10-CM | POA: Diagnosis not present

## 2021-08-01 DIAGNOSIS — E785 Hyperlipidemia, unspecified: Secondary | ICD-10-CM | POA: Diagnosis not present

## 2021-08-01 DIAGNOSIS — I129 Hypertensive chronic kidney disease with stage 1 through stage 4 chronic kidney disease, or unspecified chronic kidney disease: Secondary | ICD-10-CM | POA: Diagnosis not present

## 2021-08-01 DIAGNOSIS — Z87898 Personal history of other specified conditions: Secondary | ICD-10-CM | POA: Diagnosis not present

## 2021-08-01 DIAGNOSIS — E1122 Type 2 diabetes mellitus with diabetic chronic kidney disease: Secondary | ICD-10-CM | POA: Diagnosis not present

## 2021-08-23 DIAGNOSIS — H353231 Exudative age-related macular degeneration, bilateral, with active choroidal neovascularization: Secondary | ICD-10-CM | POA: Diagnosis not present

## 2021-09-04 ENCOUNTER — Ambulatory Visit
Admission: RE | Admit: 2021-09-04 | Discharge: 2021-09-04 | Disposition: A | Discharge status: Home / Self Care | Payer: Medicare PPO | Source: Ambulatory Visit | Attending: Family Medicine | Admitting: Family Medicine

## 2021-09-04 DIAGNOSIS — Z1231 Encounter for screening mammogram for malignant neoplasm of breast: Secondary | ICD-10-CM | POA: Diagnosis not present

## 2021-10-13 DIAGNOSIS — H353231 Exudative age-related macular degeneration, bilateral, with active choroidal neovascularization: Secondary | ICD-10-CM | POA: Diagnosis not present

## 2021-11-24 DIAGNOSIS — H353231 Exudative age-related macular degeneration, bilateral, with active choroidal neovascularization: Secondary | ICD-10-CM | POA: Diagnosis not present

## 2021-11-24 DIAGNOSIS — H26493 Other secondary cataract, bilateral: Secondary | ICD-10-CM | POA: Diagnosis not present

## 2021-12-04 ENCOUNTER — Other Ambulatory Visit: Payer: Self-pay

## 2021-12-04 NOTE — Patient Outreach (Signed)
[facility]) Care Management  12/04/2021  Margaret Lucas Sep 25, 1934 [id number]   Telephone call to patient for nurse call.  No answer.  HIPAA compliant voice message left.    Plan: RN CM will attempt again within 4 business days and send letter.  Bary Leriche, RN, MSN [facility] Care Management Care Management Coordinator Direct Line [phone] Toll Free: [phone]  Fax: [phone]

## 2021-12-07 ENCOUNTER — Other Ambulatory Visit: Payer: Self-pay

## 2021-12-07 NOTE — Patient Outreach (Signed)
[facility]) Care Management  12/07/2021  Margaret Lucas 06-21-1934 [id number]   Telephone call to patient for nurse call.  No answer.  HIPAA compliant voice message left.    Plan: RN CM will attempt again within 4 business days and send letter.  Bary Leriche, RN, MSN [facility] Care Management Care Management Coordinator Direct Line [phone] Toll Free: [phone]  Fax: [phone]

## 2021-12-14 ENCOUNTER — Other Ambulatory Visit: Payer: Self-pay

## 2021-12-14 NOTE — Patient Outreach (Signed)
[facility]) Care Management  12/14/2021  Margaret Lucas [birth year] [id number]   Telephone call to patient for nurse call. Patient states she cannot talk right now and agreeable to call another time.  Plan: RN CM will attempt again next week.  Bary Leriche, RN, MSN [facility] Care Management Care Management Coordinator Direct Line [phone] Toll Free: [phone]  Fax: [phone]

## 2021-12-20 ENCOUNTER — Telehealth: Payer: Self-pay

## 2021-12-20 NOTE — Patient Outreach (Signed)
  Care Coordination   Initial Visit Note   12/20/2021 Name: HYUN REALI MRN: [id number] DOB: [birth year]  CECILY LAWHORNE is a 87 y.o. year old female who sees Irena Reichmann, [place] for primary care. I spoke with  Merian Capron by phone today.  What matters to the patients health and wellness today?  None voiced.  Discussed annual wellness visit    Goals Addressed             This Visit's Progress    COMPLETED: Care Coordination Activites-no follow up required       Care Coordination Interventions: Advised patient to Schedule AWV          SDOH assessments and interventions completed:  Yes     Care Coordination Interventions Activated:  Yes  Care Coordination Interventions:  Yes, provided    Follow up plan: No further intervention required.   Encounter Outcome:  Pt. Visit Completed   Bary Leriche, RN, MSN [facility] Care Management Care Management Coordinator Direct Line [phone] Toll Free: [phone]  Fax: [phone]

## 2022-01-12 DIAGNOSIS — H353231 Exudative age-related macular degeneration, bilateral, with active choroidal neovascularization: Secondary | ICD-10-CM | POA: Diagnosis not present

## 2022-01-23 DIAGNOSIS — K219 Gastro-esophageal reflux disease without esophagitis: Secondary | ICD-10-CM | POA: Diagnosis not present

## 2022-01-23 DIAGNOSIS — E785 Hyperlipidemia, unspecified: Secondary | ICD-10-CM | POA: Diagnosis not present

## 2022-01-23 DIAGNOSIS — I129 Hypertensive chronic kidney disease with stage 1 through stage 4 chronic kidney disease, or unspecified chronic kidney disease: Secondary | ICD-10-CM | POA: Diagnosis not present

## 2022-01-23 DIAGNOSIS — E1122 Type 2 diabetes mellitus with diabetic chronic kidney disease: Secondary | ICD-10-CM | POA: Diagnosis not present

## 2022-01-23 DIAGNOSIS — E039 Hypothyroidism, unspecified: Secondary | ICD-10-CM | POA: Diagnosis not present

## 2022-02-14 DIAGNOSIS — Z8719 Personal history of other diseases of the digestive system: Secondary | ICD-10-CM | POA: Diagnosis not present

## 2022-02-14 DIAGNOSIS — Z23 Encounter for immunization: Secondary | ICD-10-CM | POA: Diagnosis not present

## 2022-02-14 DIAGNOSIS — K219 Gastro-esophageal reflux disease without esophagitis: Secondary | ICD-10-CM | POA: Diagnosis not present

## 2022-02-14 DIAGNOSIS — I10 Essential (primary) hypertension: Secondary | ICD-10-CM | POA: Diagnosis not present

## 2022-02-14 DIAGNOSIS — E039 Hypothyroidism, unspecified: Secondary | ICD-10-CM | POA: Diagnosis not present

## 2022-02-14 DIAGNOSIS — E785 Hyperlipidemia, unspecified: Secondary | ICD-10-CM | POA: Diagnosis not present

## 2022-02-23 DIAGNOSIS — H353231 Exudative age-related macular degeneration, bilateral, with active choroidal neovascularization: Secondary | ICD-10-CM | POA: Diagnosis not present

## 2022-03-30 DIAGNOSIS — H26493 Other secondary cataract, bilateral: Secondary | ICD-10-CM | POA: Diagnosis not present

## 2022-03-30 DIAGNOSIS — H353231 Exudative age-related macular degeneration, bilateral, with active choroidal neovascularization: Secondary | ICD-10-CM | POA: Diagnosis not present

## 2022-05-04 DIAGNOSIS — H353231 Exudative age-related macular degeneration, bilateral, with active choroidal neovascularization: Secondary | ICD-10-CM | POA: Diagnosis not present

## 2022-06-15 DIAGNOSIS — H353231 Exudative age-related macular degeneration, bilateral, with active choroidal neovascularization: Secondary | ICD-10-CM | POA: Diagnosis not present

## 2022-06-19 DIAGNOSIS — E039 Hypothyroidism, unspecified: Secondary | ICD-10-CM | POA: Diagnosis not present

## 2022-06-19 DIAGNOSIS — Z8719 Personal history of other diseases of the digestive system: Secondary | ICD-10-CM | POA: Diagnosis not present

## 2022-06-19 DIAGNOSIS — K219 Gastro-esophageal reflux disease without esophagitis: Secondary | ICD-10-CM | POA: Diagnosis not present

## 2022-06-19 DIAGNOSIS — I10 Essential (primary) hypertension: Secondary | ICD-10-CM | POA: Diagnosis not present

## 2022-06-27 DIAGNOSIS — I129 Hypertensive chronic kidney disease with stage 1 through stage 4 chronic kidney disease, or unspecified chronic kidney disease: Secondary | ICD-10-CM | POA: Diagnosis not present

## 2022-06-27 DIAGNOSIS — E039 Hypothyroidism, unspecified: Secondary | ICD-10-CM | POA: Diagnosis not present

## 2022-06-27 DIAGNOSIS — R7303 Prediabetes: Secondary | ICD-10-CM | POA: Diagnosis not present

## 2022-06-27 DIAGNOSIS — E785 Hyperlipidemia, unspecified: Secondary | ICD-10-CM | POA: Diagnosis not present

## 2022-06-27 DIAGNOSIS — H3530 Unspecified macular degeneration: Secondary | ICD-10-CM | POA: Diagnosis not present

## 2022-06-27 DIAGNOSIS — I350 Nonrheumatic aortic (valve) stenosis: Secondary | ICD-10-CM | POA: Diagnosis not present

## 2022-07-27 DIAGNOSIS — H353231 Exudative age-related macular degeneration, bilateral, with active choroidal neovascularization: Secondary | ICD-10-CM | POA: Diagnosis not present

## 2022-08-23 ENCOUNTER — Ambulatory Visit: Payer: Medicare PPO | Admitting: Cardiology

## 2022-08-24 NOTE — Progress Notes (Unsigned)
Cardiology Clinic Note   Patient Name: Margaret Lucas Date of Encounter: 08/27/2022  Primary Care Provider:  Irena Reichmann, DO Primary Cardiologist:  None  Patient Profile    Margaret Lucas 88 year old female presents the clinic today for follow-up evaluation of her hypertension and aortic stenosis.  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    TUBAL LIGATION      Allergies  Allergies  Allergen Reactions   Chlorthalidone     Other reaction(s): hyponatremia  caused hospital stay   Demerol [Meperidine] Other (See Comments)    unknown    History of Present Illness    Margaret Lucas has a PMH of diabetes, HTN, and aortic stenosis.  She notes a cardiac murmur several years ago.  Her echocardiogram 2014 showed aortic valve sclerosis.  She had a repeat echocardiogram in 2017 which showed mild aortic stenosis with unchanged valvular velocity.  Her echocardiogram 1/22 showed mild AS which was stable.  She was admitted 11/21 with nausea vomiting dehydration UTI and hyponatremia.  Her HCTZ was held.  Her follow-up labs showed improved sodium.  She was seen in follow-up by Dr. Swaziland on 05/30/2021.  During that time she remained stable from a cardiac standpoint.  She did note some discomfort between her shoulder blades.  She reported aching in her right arm.  This was not associated with activity.  Her blood pressure was well-controlled.  Follow-up  was planned for 1 year.  She presents to the clinic today for follow-up evaluation and states over the past months she has noticed decreased endurance, increased DOE, and leg weakness.  She wonders if it is related to her aortic valve.  Of note about 1 month ago she had a reduction in her levothyroxine.  She is currently taking 100 mcg Monday through Friday and not taking the medication Saturday and Sunday.  I will order an echocardiogram, CBC, BMP, thyroid panel and plan follow-up in 2 months.  Today she denies chest pain, shortness of breath, lower extremity edema,  palpitations, melena, hematuria, hemoptysis, diaphoresis,  presyncope, syncope, orthopnea, and PND.   Home Medications    Prior to Admission medications   Medication Sig Start Date End Date Taking? Authorizing Provider  amLODipine (NORVASC) 10 MG tablet Take 10 mg by mouth daily.    [provider]  cholecalciferol (VITAMIN D) 1000 units tablet Take 5,000 Units by mouth daily.    [provider]  fexofenadine (ALLEGRA) 180 MG tablet Take 180 mg by mouth as needed.    [provider]  fluticasone (FLONASE) 50 MCG/ACT nasal spray Place 1-2 sprays into both nostrils as needed.    [provider]  levothyroxine (SYNTHROID, LEVOTHROID) 100 MCG tablet Take 100 mcg by mouth daily before breakfast.    [provider]  lisinopril (ZESTRIL) 40 MG tablet Take 40 mg by mouth in the morning and at bedtime.  [provider]  meclizine (ANTIVERT) 25 MG tablet Take 1 tablet (25 mg total) by mouth 3 (three) times daily as needed for dizziness. 07/16/16   Long, Arlyss Repress, MD  Multiple Vitamins-Minerals (ICAPS AREDS FORMULA PO) Take 1 tablet by mouth 2 (two) times daily.    [provider]  pantoprazole (PROTONIX) 40 MG tablet Take 1 tablet (40 mg total) by mouth 2 (two) times daily. Patient taking differently: Take 40 mg by mouth daily. 08/11/16   Pearson Grippe, MD    Family History     Family History  Problem Relation Age of Onset   Stroke Mother    Cancer Sister    Breast cancer Sister 61   Pneumonia Father    Kidney failure Brother    Aortic stenosis Brother    She indicated that her mother is deceased. She indicated that her father is deceased. She indicated that her sister is deceased. She indicated that only one of her two brothers is alive.  Social History    Social History   Socioeconomic History   Marital status: Widowed    Spouse name: Not on file   Number of children: 3   Years of education: Not on file   Highest education level: Not on file  Occupational History   Occupation: retired Runner, broadcasting/film/video  Tobacco Use   Smoking status: Never   Smokeless tobacco: Never  Substance and Sexual Activity   Alcohol use: No   Drug use: No   Sexual activity: Not on file  Other Topics Concern   Not on file  Social History Narrative   Not on file   Social Determinants of Health   Financial Resource Strain: Not on file  Food Insecurity: No Food Insecurity (12/20/2021)   Hunger Vital Sign    Worried About Running Out of Food in the Last Year: Never true    Ran Out of Food in the Last Year: Never true  Transportation Needs: Not on file  Physical Activity: Not on file  Stress: Not on file  Social Connections: Not on file  Intimate Partner Violence: Not on file     Review of Systems    General:  No chills, fever, night sweats or weight changes.  Cardiovascular:  No chest pain, dyspnea on exertion, edema, orthopnea, palpitations, paroxysmal nocturnal dyspnea. Dermatological: No rash, lesions/masses Respiratory: No cough, dyspnea Urologic: No hematuria, dysuria Abdominal:   No nausea, vomiting, diarrhea, bright red blood per rectum, melena, or hematemesis Neurologic:  No visual changes, wkns, changes in mental status. All other systems reviewed and are otherwise negative except as noted above.  Physical Exam    VS:  BP 138/64   Pulse 60   Ht 5\' 3"  (1.6 m)    Wt 120 lb 12.8 oz (54.8 kg)   SpO2 98%   BMI 21.40 kg/m  , BMI Body mass index is 21.4 kg/m. GEN: Well nourished, well developed, in no acute distress. HEENT: normal. Neck: Supple, no JVD, carotid bruits, or masses. Cardiac: RRR, no murmurs, rubs, or gallops. No clubbing, cyanosis, edema.  Radials/DP/PT 2+ and equal bilaterally.  Respiratory:  Respirations regular and unlabored, clear to auscultation bilaterally. GI: Soft, nontender, nondistended, BS + x 4. MS: no deformity or atrophy. Skin: warm and dry, no rash. Neuro:  Strength and sensation are intact. Psych: Normal affect.  Accessory Clinical Findings    Recent Labs: No results found for requested labs within last 365 days.   Recent Lipid Panel No results found for: "  CHOL", "TRIG", "HDL", "CHOLHDL", "VLDL", "LDLCALC", "LDLDIRECT"       ECG personally reviewed by me today-normal sinus rhythm right bundle branch block septal infarct undetermined age- No acute changes  Echocardiogram 05/10/2020  IMPRESSIONS     1. Left ventricular ejection fraction, by estimation, is 60 to 65%. The  left ventricle has normal function. The left ventricle has no regional  wall motion abnormalities. Left ventricular diastolic parameters are  consistent with Grade I diastolic  dysfunction (impaired relaxation).   2. Right ventricular systolic function is normal. The right ventricular  size is normal.   3. The mitral valve is grossly normal. Trivial mitral valve  regurgitation.   4. The aortic valve is tricuspid. There is moderate calcification of the  aortic valve. Aortic valve regurgitation is mild to moderate. Mild aortic  valve stenosis. Aortic valve area, by VTI measures 1.65 cm. Aortic valve  mean gradient measures 17.0  mmHg. Aortic valve Vmax measures 2.76 m/s.   Comparison(s): A prior study was performed on 02/22/2016. Similar aortic  gradients with slight increase in aortic valve calcification. Largest mean  gradient  acquired 22 mm Hg.   FINDINGS   Left Ventricle: Left ventricular ejection fraction, by estimation, is 60  to 65%. The left ventricle has normal function. The left ventricle has no  regional wall motion abnormalities. The left ventricular internal cavity  size was normal in size. There is   no left ventricular hypertrophy. Left ventricular diastolic parameters  are consistent with Grade I diastolic dysfunction (impaired relaxation).   Right Ventricle: The right ventricular size is normal. No increase in  right ventricular wall thickness. Right ventricular systolic function is  normal.   Left Atrium: Left atrial size was normal in size.   Right Atrium: Right atrial size was normal in size.   Pericardium: There is no evidence of pericardial effusion.   Mitral Valve: The mitral valve is grossly normal. Mild mitral annular  calcification. Trivial mitral valve regurgitation.   Tricuspid Valve: The tricuspid valve is normal in structure. Tricuspid  valve regurgitation is trivial.   Aortic Valve: LVSVI > 35. The aortic valve is tricuspid. There is moderate  calcification of the aortic valve. There is mild aortic valve annular  calcification. Aortic valve regurgitation is mild to moderate. Aortic  regurgitation PHT measures 440 msec.  Mild aortic stenosis is present. Aortic valve mean gradient measures 17.0  mmHg. Aortic valve peak gradient measures 30.6 mmHg. Aortic valve area, by  VTI measures 1.65 cm.   Pulmonic Valve: The pulmonic valve was not well visualized. Pulmonic valve  regurgitation is not visualized.   Aorta: The aortic root and ascending aorta are structurally normal, with  no evidence of dilitation.   IAS/Shunts: The atrial septum is grossly normal.    Assessment & Plan   1.  Aortic stenosis-does note some increased fatigue and activity intolerance.  Echocardiogram 1/22 showed unchanged aortic valve stenosis and valve gradient.  She remains asymptomatic.  She  denies activity intolerance and increased DOE. Repeat echocardiogram when clinically indicated. Order CBC, BMP, thyroid panel Essential hypertension-BP today 144/64 and 138/64 . Low-sodium diet Maintain blood pressure log Continue amlodipine, lisinopril  Hyperlipidemia-managed with diet and exercise.  Follows with PCP. Continue high-fiber diet  RBBB-EKG today shows normal sinus rhythm right bundle branch block septal infarct undetermined age 33 bpm.  Denies recent episodes of lightheadedness, presyncope or syncope  Hypothyroidism-reports that 1 month ago when her symptoms started to appear her thyroid medication had been reduced.  She is currently taking Synthroid 100 mcg Monday through Friday and not taking the medication on Saturday and Sunday Repeat thyroid panel  Disposition: Follow-up with Dr. Swaziland or me in 2 months.   Thomasene Ripple. Catalia Massett NP-C     08/27/2022, 3:55 PM [facility] [address] Office [phone] Fax [phone]    I spent 14 minutes examining this patient, reviewing medications, and using patient centered shared decision making involving her cardiac care.  Prior to her visit I spent greater than 20 minutes reviewing her past medical history,  medications, and prior cardiac tests.

## 2022-08-27 ENCOUNTER — Ambulatory Visit: Payer: Medicare PPO | Attending: Cardiology | Admitting: General Practice

## 2022-08-27 ENCOUNTER — Encounter: Payer: Self-pay | Admitting: General Practice

## 2022-08-27 VITALS — BP 138/64 | HR 60 | Ht 63.0 in | Wt 120.8 lb

## 2022-08-27 DIAGNOSIS — I4510 Unspecified right bundle-branch block: Secondary | ICD-10-CM

## 2022-08-27 DIAGNOSIS — E782 Mixed hyperlipidemia: Secondary | ICD-10-CM

## 2022-08-27 DIAGNOSIS — I350 Nonrheumatic aortic (valve) stenosis: Secondary | ICD-10-CM

## 2022-08-27 DIAGNOSIS — I10 Essential (primary) hypertension: Secondary | ICD-10-CM | POA: Diagnosis not present

## 2022-08-27 DIAGNOSIS — E039 Hypothyroidism, unspecified: Secondary | ICD-10-CM | POA: Diagnosis not present

## 2022-08-27 NOTE — Patient Instructions (Addendum)
Medication Instructions:  The current medical regimen is effective;  continue present plan and medications as directed. Please refer to the Current Medication list given to you today.  *If you need a refill on your cardiac medications before your next appointment, please call your pharmacy*  Lab Work: BMET, CBC AND TSH TODAY If you have labs (blood work) drawn today and your tests are completely normal, you will receive your results only by: MyChart Message (if you have MyChart) OR  A paper copy in the mail If you have any lab test that is abnormal or we need to change your treatment, we will call you to review the results.  Other Instructions MAINTAIN PHYSICAL ACTIVITY   Echocardiogram - Your physician has requested that you have an echocardiogram. Echocardiography is a painless test that uses sound waves to create images of your heart. It provides your doctor with information about the size and shape of your heart and how well your heart's chambers and valves are working. This procedure takes approximately one hour. There are no restrictions for this procedure.    Follow-Up: At [facility], you and your health needs are our priority.  As part of our continuing mission to provide you with exceptional heart care, we have created designated Provider Care Teams.  These Care Teams include your primary Cardiologist (physician) and Advanced Practice Providers (APPs -  Physician Assistants and Nurse Practitioners) who all work together to provide you with the care you need, when you need it.  Your next appointment:   2 month(s) (MAKE SURE THIS IS AFTER ECHO)  Provider:   Edd Fabian, FNP-C

## 2022-08-28 ENCOUNTER — Other Ambulatory Visit: Payer: Self-pay

## 2022-08-28 DIAGNOSIS — E039 Hypothyroidism, unspecified: Secondary | ICD-10-CM

## 2022-08-28 DIAGNOSIS — Z79899 Other long term (current) drug therapy: Secondary | ICD-10-CM

## 2022-08-28 LAB — TSH: TSH: 7.13 u[IU]/mL — ABNORMAL HIGH (ref 0.450–4.500)

## 2022-08-28 LAB — BASIC METABOLIC PANEL
BUN/Creatinine Ratio: 14 (ref 12–28)
BUN: 15 mg/dL (ref 8–27)
CO2: 24 mmol/L (ref 20–29)
Calcium: 9.5 mg/dL (ref 8.7–10.3)
Chloride: 97 mmol/L (ref 96–106)
Creatinine, Ser: 1.06 mg/dL — ABNORMAL HIGH (ref 0.57–1.00)
Glucose: 130 mg/dL — ABNORMAL HIGH (ref 70–99)
Potassium: 5.4 mmol/L — ABNORMAL HIGH (ref 3.5–5.2)
Sodium: 133 mmol/L — ABNORMAL LOW (ref 134–144)
eGFR: 51 mL/min/{1.73_m2} — ABNORMAL LOW (ref >59)

## 2022-08-28 LAB — CBC
Hematocrit: 34.2 % (ref 34.0–46.6)
Hemoglobin: 11.4 g/dL (ref 11.1–15.9)
MCH: 28.6 pg (ref 26.6–33.0)
MCHC: 33.3 g/dL (ref 31.5–35.7)
MCV: 86 fL (ref 79–97)
Platelets: 268 10*3/uL (ref 150–450)
RBC: 3.99 x10E6/uL (ref 3.77–5.28)
RDW: 13.1 % (ref 11.7–15.4)
WBC: 8.2 10*3/uL (ref 3.4–10.8)

## 2022-08-30 ENCOUNTER — Ambulatory Visit (HOSPITAL_COMMUNITY): Payer: Medicare PPO | Attending: Cardiology

## 2022-08-30 DIAGNOSIS — I350 Nonrheumatic aortic (valve) stenosis: Secondary | ICD-10-CM | POA: Insufficient documentation

## 2022-08-30 LAB — ECHOCARDIOGRAM COMPLETE
AR max vel: 1.07 cm2
AV Area VTI: 3.07 cm2
AV Area mean vel: 3.16 cm2
AV Mean grad: 30 mmHg
AV Peak grad: 51.3 mmHg
Ao pk vel: 3.58 m/s
Area-P 1/2: 2.09 cm2
P 1/2 time: 1098 msec
S' Lateral: 2 cm

## 2022-09-03 ENCOUNTER — Telehealth: Payer: Self-pay | Admitting: Cardiology

## 2022-09-03 ENCOUNTER — Other Ambulatory Visit: Payer: Self-pay

## 2022-09-03 DIAGNOSIS — I350 Nonrheumatic aortic (valve) stenosis: Secondary | ICD-10-CM

## 2022-09-03 NOTE — Telephone Encounter (Signed)
Spoke to patient echo results given.Order placed to see structural heart team.Advised Dr.Jordan out of office today.I will make him aware.

## 2022-09-03 NOTE — Telephone Encounter (Signed)
Patient is requesting a call back to further discuss echo results.

## 2022-09-04 ENCOUNTER — Telehealth: Payer: Self-pay

## 2022-09-04 NOTE — Telephone Encounter (Signed)
Spoke to patient, Dr.Jordan reviewed your recent echo.He would like to see you in office to discuss.Appointment scheduled with him 5/30 at 10:00 am.

## 2022-09-06 ENCOUNTER — Other Ambulatory Visit: Payer: Self-pay

## 2022-09-06 DIAGNOSIS — Z79899 Other long term (current) drug therapy: Secondary | ICD-10-CM | POA: Diagnosis not present

## 2022-09-06 DIAGNOSIS — E039 Hypothyroidism, unspecified: Secondary | ICD-10-CM | POA: Diagnosis not present

## 2022-09-07 DIAGNOSIS — H353231 Exudative age-related macular degeneration, bilateral, with active choroidal neovascularization: Secondary | ICD-10-CM | POA: Diagnosis not present

## 2022-09-07 LAB — BASIC METABOLIC PANEL
BUN/Creatinine Ratio: 16 (ref 12–28)
BUN: 14 mg/dL (ref 8–27)
CO2: 22 mmol/L (ref 20–29)
Calcium: 9.4 mg/dL (ref 8.7–10.3)
Chloride: 96 mmol/L (ref 96–106)
Creatinine, Ser: 0.9 mg/dL (ref 0.57–1.00)
Glucose: 111 mg/dL — ABNORMAL HIGH (ref 70–99)
Potassium: 5.5 mmol/L — ABNORMAL HIGH (ref 3.5–5.2)
Sodium: 131 mmol/L — ABNORMAL LOW (ref 134–144)
eGFR: 63 mL/min/{1.73_m2} (ref >59)

## 2022-09-07 NOTE — Progress Notes (Signed)
Cardiology Office Note   Date:  09/13/2022   ID:  Margaret, Lucas 1935-02-20, MRN [id number]  PCP:  Margaret Reichmann, DO  Cardiologist:   Margaret Sandin [place], MD   No chief complaint on file.     History of Present Illness: Margaret Lucas is a 88 y.o. female who is seen for follow up of aortic stenosis.  She has a history of HTN and DM- diet controlled. She mentions she has a murmur dating back several  years. She had an Echo in 2014 showing some AV sclerosis. Repeat in 2017 showed mild AS but valve velocity was really unchanged. Echo in Jan 2022 showed mild AS without change.   She was admitted in November 2021 with N/V, dehydration, UTI and hyponatremia. HCT held. Follow up sodium level improved.    She was seen by Edd Fabian NP on May 13 with complaints of increased DOE, Fatigue and leg weakness. Labs done and repeat Echo ordered. TSH was elevated to 7.1. chronically low sodium. CBC normal. Echo showed normal EF with increased AV gradient. Mean 30 mm Hg.   On follow up today she is very concerned about her valve. Notes her brother waited too late and died after open heart surgery.   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    TUBAL LIGATION       Current Outpatient Medications  Medication Sig Dispense Refill   amLODipine (NORVASC) 10 MG tablet Take 10 mg by mouth daily.     cholecalciferol (VITAMIN D) 1000 units tablet Take 5,000 Units by mouth daily.      fexofenadine (ALLEGRA) 180 MG tablet Take 180 mg by mouth as needed.     fluticasone (FLONASE) 50 MCG/ACT nasal spray Place 1-2 sprays into both nostrils as needed.     levothyroxine (SYNTHROID, LEVOTHROID) 100 MCG tablet Take 100 mcg by mouth daily before breakfast.     lisinopril (ZESTRIL) 40 MG tablet Take 40 mg by mouth in the morning and at bedtime.     meclizine (ANTIVERT) 25 MG tablet Take 1 tablet (25 mg total) by mouth 3 (three) times daily as needed for dizziness. 30 tablet 0   Multiple Vitamins-Minerals (ICAPS AREDS FORMULA PO) Take 1 tablet by mouth 2 (two) times daily.     pantoprazole (PROTONIX) 40 MG tablet Take 1 tablet (40 mg total) by mouth 2 (two) times daily. (Patient taking differently: Take 40 mg by mouth daily.) 60 tablet 0   No current facility-administered medications for this visit.    Allergies:   Chlorthalidone and Demerol [meperidine]    Social History:  The patient  reports that she has never smoked. She has never used smokeless tobacco. She reports that she does not drink alcohol and does not use drugs.   Family History:  The patient's family history includes Aortic stenosis in her brother; Breast cancer (age of onset: 31) in her sister; Cancer in her sister;  Kidney failure in her brother; Pneumonia in her father; Stroke in her mother.    ROS:  Please see the history of present illness.   Otherwise, review of systems are positive for none.   All other systems are reviewed and negative.    PHYSICAL EXAM: VS:  BP 128/74 (BP Location: Left Arm, Patient Position: Sitting, Cuff Size: Normal)   Pulse (!) 54   Ht 5\' 3"  (1.6 m)   Wt 121 lb 6.4 oz (55.1 kg)   SpO2 98%   BMI 21.51 kg/m  , BMI Body mass index is 21.51 kg/m. GENERAL:  Well appearing WF in NAD HEENT:  PERRL, EOMI, sclera are clear. Oropharynx is clear. NECK:  No jugular venous distention, carotid upstroke brisk and symmetric, no bruits, no thyromegaly or adenopathy LUNGS:  Clear to auscultation  bilaterally CHEST:  Unremarkable HEART:  RRR,  PMI not displaced or sustained, grade 2/6 harsh systolic murmur RUSB. S1 and S2 within normal limits, no S3, no S4: no clicks, no rubs ABD:  Soft, nontender. BS +, no masses or bruits. No hepatomegaly, no splenomegaly EXT:  2 + pulses throughout, no edema, no cyanosis no clubbing SKIN:  Warm and dry.  No rashes NEURO:  Alert and oriented x 3. Cranial nerves II through XII intact. PSYCH:  Cognitively intact     EKG:  EKG is not ordered today.    Recent Labs: 08/27/2022: Hemoglobin 11.4; Platelets 268; TSH 7.130 09/06/2022: BUN 14; Creatinine, Ser 0.90; Potassium 5.5; Sodium 131    Lipid Panel No results found for: "CHOL", "TRIG", "HDL", "CHOLHDL", "VLDL", "LDLCALC", "LDLDIRECT"   Labs dated 06/03/17: cholesterol 232, triglycerides 158, HDL 47, LDL 153. A1c 6.2%. Potassium 5.5. Creatinine 0.74 Dated 05/20/18: cholesterol 218, triglycerides 121, HDL 54, LDL 140. CMET and TSH normal.  Wt Readings from Last 3 Encounters:  09/13/22 121 lb 6.4 oz (55.1 kg)  08/27/22 120 lb 12.8 oz (54.8 kg)  05/30/21 129 lb 9.6 oz (58.8 kg)      Other studies Reviewed: Additional studies/ records that were reviewed today include:  Echo 02/22/16: Study Conclusions   - Left ventricle: The cavity size was normal. Wall thickness was   normal. Systolic function was normal. The estimated ejection   fraction was in the range of 60% to 65%. Wall motion was normal;   there were no regional wall motion abnormalities. Doppler   parameters are consistent with abnormal left ventricular   relaxation (grade 1 diastolic dysfunction). - Aortic valve: There was mild stenosis. There was mild   regurgitation. - Mitral valve: Calcified annulus. There was mild regurgitation  Echo 05/10/20: IMPRESSIONS     1. Left ventricular ejection fraction, by estimation, is 60 to 65%. The  left ventricle has normal function. The left ventricle has no regional  wall motion  abnormalities. Left ventricular diastolic parameters are  consistent with Grade I diastolic  dysfunction (impaired relaxation).   2. Right ventricular systolic function is normal. The right ventricular  size is normal.   3. The mitral valve is grossly normal. Trivial mitral valve  regurgitation.   4. The aortic valve is tricuspid. There is moderate calcification of the  aortic valve. Aortic valve regurgitation is mild to moderate. Mild aortic  valve stenosis. Aortic valve area, by VTI measures 1.65 cm. Aortic valve  mean gradient measures 17.0  mmHg. Aortic valve Vmax measures 2.76 m/s.   Comparison(s): A prior study was performed on 02/22/2016. Similar aortic  gradients with slight increase in aortic valve  calcification. Largest mean  gradient acquired 22 mm Hg.   Echo 08/29/20: IMPRESSIONS     1. Moderate aortic stenosis.   2. Left ventricular ejection fraction, by estimation, is 60 to 65%. The  left ventricle has normal function. The left ventricle has no regional  wall motion abnormalities. Left ventricular diastolic parameters are  consistent with Grade I diastolic  dysfunction (impaired relaxation).   3. Right ventricular systolic function is normal. The right ventricular  size is normal. There is normal pulmonary artery systolic pressure.   4. The mitral valve is normal in structure. Mild mitral valve  regurgitation. No evidence of mitral stenosis.   5. The aortic valve is calcified. There is moderate calcification of the  aortic valve. There is moderate thickening of the aortic valve. Aortic  valve regurgitation is mild. Moderate aortic valve stenosis. Aortic valve  mean gradient measures 30.0 mmHg.  Aortic valve Vmax measures 3.58 m/s.   6. The inferior vena cava is normal in size with greater than 50%  respiratory variability, suggesting right atrial pressure of 3 mmHg.   Comparison(s): Prior images reviewed side by side. ECHO 01/22 aortic mean  17 and peak gradient  30.6 mmHg.    ASSESSMENT AND PLAN:  1.  Aortic stenosis at least moderate based on her recent Echo with increased gradient from prior. She is symptomatic now. Need to consider whether she is a candidate for TAVR. Recommend right and left heart cath. Will schedule for June 7. Will then have valve team see her. The procedure and risks were reviewed including but not limited to death, myocardial infarction, stroke, arrythmias, bleeding, transfusion, emergency surgery, dye allergy, or renal dysfunction. The patient voices understanding and is agreeable to proceed.  2. RBBB. Chronic. 3. HTN. Well controlled.  4. Hypercholesterolemia. Managed with diet.  LDL 140. If she has CAD on cath will need to initiate statin therapy.  5. Elevated potassium 5.5. will reduce lisinopril to 40 mg daily and repeat 6. Elevated TSH 7.1 will check free T4 and T3 levels.    Signed, Dail Meece [place], MD  09/13/2022 10:56 AM    [facility] [address] [place], [place], [address] Phone [phone], Fax [phone]

## 2022-09-07 NOTE — H&P (View-Only) (Signed)
  Cardiology Office Note   Date:  09/13/2022   ID:  Margaret Lucas, DOB 03/18/1935, MRN [id number]  PCP:  Collins, Dana, DO  Cardiologist:   Aylene Acoff, MD   No chief complaint on file.     History of Present Illness: Margaret Lucas is a 88 y.o. female who is seen for follow up of aortic stenosis.  She has a history of HTN and DM- diet controlled. She mentions she has a murmur dating back several  years. She had an Echo in 2014 showing some AV sclerosis. Repeat in 2017 showed mild AS but valve velocity was really unchanged. Echo in Jan 2022 showed mild AS without change.   She was admitted in November 2021 with N/V, dehydration, UTI and hyponatremia. HCT held. Follow up sodium level improved.    She was seen by Jesse Cleaver NP on May 13 with complaints of increased DOE, Fatigue and leg weakness. Labs done and repeat Echo ordered. TSH was elevated to 7.1. chronically low sodium. CBC normal. Echo showed normal EF with increased AV gradient. Mean 30 mm Hg.   On follow up today she is very concerned about her valve. Notes her brother waited too late and died after open heart surgery.   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    TUBAL LIGATION       Current Outpatient Medications  Medication Sig Dispense Refill   amLODipine (NORVASC) 10 MG tablet Take 10 mg by mouth daily.     cholecalciferol (VITAMIN D) 1000 units tablet Take 5,000 Units by mouth daily.      fexofenadine (ALLEGRA) 180 MG tablet Take 180 mg by mouth as needed.     fluticasone (FLONASE) 50 MCG/ACT nasal spray Place 1-2 sprays into both nostrils as needed.     levothyroxine (SYNTHROID, LEVOTHROID) 100 MCG tablet Take 100 mcg by mouth daily before breakfast.     lisinopril (ZESTRIL) 40 MG tablet Take 40 mg by mouth in the morning and at bedtime.     meclizine (ANTIVERT) 25 MG tablet Take 1 tablet (25 mg total) by mouth 3 (three) times daily as needed for dizziness. 30 tablet 0   Multiple Vitamins-Minerals (ICAPS AREDS FORMULA PO) Take 1 tablet by mouth 2 (two) times daily.     pantoprazole (PROTONIX) 40 MG tablet Take 1 tablet (40 mg total) by mouth 2 (two) times daily. (Patient taking differently: Take 40 mg by mouth daily.) 60 tablet 0   No current facility-administered medications for this visit.    Allergies:   Chlorthalidone and Demerol [meperidine]    Social History:  The patient  reports that she has never smoked. She has never used smokeless tobacco. She reports that she does not drink alcohol and does not use drugs.   Family History:  The patient's family history includes Aortic stenosis in her brother; Breast cancer (age of onset: 46) in her sister; Cancer in her sister;   Kidney failure in her brother; Pneumonia in her father; Stroke in her mother.    ROS:  Please see the history of present illness.   Otherwise, review of systems are positive for none.   All other systems are reviewed and negative.    PHYSICAL EXAM: VS:  BP 128/74 (BP Location: Left Arm, Patient Position: Sitting, Cuff Size: Normal)   Pulse (!) 54   Ht 5' 3" (1.6 m)   Wt 121 lb 6.4 oz (55.1 kg)   SpO2 98%   BMI 21.51 kg/m  , BMI Body mass index is 21.51 kg/m. GENERAL:  Well appearing WF in NAD HEENT:  PERRL, EOMI, sclera are clear. Oropharynx is clear. NECK:  No jugular venous distention, carotid upstroke brisk and symmetric, no bruits, no thyromegaly or adenopathy LUNGS:  Clear to auscultation  bilaterally CHEST:  Unremarkable HEART:  RRR,  PMI not displaced or sustained, grade 2/6 harsh systolic murmur RUSB. S1 and S2 within normal limits, no S3, no S4: no clicks, no rubs ABD:  Soft, nontender. BS +, no masses or bruits. No hepatomegaly, no splenomegaly EXT:  2 + pulses throughout, no edema, no cyanosis no clubbing SKIN:  Warm and dry.  No rashes NEURO:  Alert and oriented x 3. Cranial nerves II through XII intact. PSYCH:  Cognitively intact     EKG:  EKG is not ordered today.    Recent Labs: 08/27/2022: Hemoglobin 11.4; Platelets 268; TSH 7.130 09/06/2022: BUN 14; Creatinine, Ser 0.90; Potassium 5.5; Sodium 131    Lipid Panel No results found for: "CHOL", "TRIG", "HDL", "CHOLHDL", "VLDL", "LDLCALC", "LDLDIRECT"   Labs dated 06/03/17: cholesterol 232, triglycerides 158, HDL 47, LDL 153. A1c 6.2%. Potassium 5.5. Creatinine 0.74 Dated 05/20/18: cholesterol 218, triglycerides 121, HDL 54, LDL 140. CMET and TSH normal.  Wt Readings from Last 3 Encounters:  09/13/22 121 lb 6.4 oz (55.1 kg)  08/27/22 120 lb 12.8 oz (54.8 kg)  05/30/21 129 lb 9.6 oz (58.8 kg)      Other studies Reviewed: Additional studies/ records that were reviewed today include:  Echo 02/22/16: Study Conclusions   - Left ventricle: The cavity size was normal. Wall thickness was   normal. Systolic function was normal. The estimated ejection   fraction was in the range of 60% to 65%. Wall motion was normal;   there were no regional wall motion abnormalities. Doppler   parameters are consistent with abnormal left ventricular   relaxation (grade 1 diastolic dysfunction). - Aortic valve: There was mild stenosis. There was mild   regurgitation. - Mitral valve: Calcified annulus. There was mild regurgitation  Echo 05/10/20: IMPRESSIONS     1. Left ventricular ejection fraction, by estimation, is 60 to 65%. The  left ventricle has normal function. The left ventricle has no regional  wall motion  abnormalities. Left ventricular diastolic parameters are  consistent with Grade I diastolic  dysfunction (impaired relaxation).   2. Right ventricular systolic function is normal. The right ventricular  size is normal.   3. The mitral valve is grossly normal. Trivial mitral valve  regurgitation.   4. The aortic valve is tricuspid. There is moderate calcification of the  aortic valve. Aortic valve regurgitation is mild to moderate. Mild aortic  valve stenosis. Aortic valve area, by VTI measures 1.65 cm. Aortic valve  mean gradient measures 17.0  mmHg. Aortic valve Vmax measures 2.76 m/s.   Comparison(s): A prior study was performed on 02/22/2016. Similar aortic  gradients with slight increase in aortic valve   calcification. Largest mean  gradient acquired 22 mm Hg.   Echo 08/29/20: IMPRESSIONS     1. Moderate aortic stenosis.   2. Left ventricular ejection fraction, by estimation, is 60 to 65%. The  left ventricle has normal function. The left ventricle has no regional  wall motion abnormalities. Left ventricular diastolic parameters are  consistent with Grade I diastolic  dysfunction (impaired relaxation).   3. Right ventricular systolic function is normal. The right ventricular  size is normal. There is normal pulmonary artery systolic pressure.   4. The mitral valve is normal in structure. Mild mitral valve  regurgitation. No evidence of mitral stenosis.   5. The aortic valve is calcified. There is moderate calcification of the  aortic valve. There is moderate thickening of the aortic valve. Aortic  valve regurgitation is mild. Moderate aortic valve stenosis. Aortic valve  mean gradient measures 30.0 mmHg.  Aortic valve Vmax measures 3.58 m/s.   6. The inferior vena cava is normal in size with greater than 50%  respiratory variability, suggesting right atrial pressure of 3 mmHg.   Comparison(s): Prior images reviewed side by side. ECHO 01/22 aortic mean  17 and peak gradient  30.6 mmHg.    ASSESSMENT AND PLAN:  1.  Aortic stenosis at least moderate based on her recent Echo with increased gradient from prior. She is symptomatic now. Need to consider whether she is a candidate for TAVR. Recommend right and left heart cath. Will schedule for June 7. Will then have valve team see her. The procedure and risks were reviewed including but not limited to death, myocardial infarction, stroke, arrythmias, bleeding, transfusion, emergency surgery, dye allergy, or renal dysfunction. The patient voices understanding and is agreeable to proceed.  2. RBBB. Chronic. 3. HTN. Well controlled.  4. Hypercholesterolemia. Managed with diet.  LDL 140. If she has CAD on cath will need to initiate statin therapy.  5. Elevated potassium 5.5. will reduce lisinopril to 40 mg daily and repeat 6. Elevated TSH 7.1 will check free T4 and T3 levels.    Signed, Tayron Hunnell, MD  09/13/2022 10:56 AM    [facility] [address], [place], [place], [address] Phone [phone], Fax [phone] 

## 2022-09-13 ENCOUNTER — Other Ambulatory Visit: Payer: Self-pay | Admitting: Cardiology

## 2022-09-13 ENCOUNTER — Encounter: Payer: Self-pay | Admitting: Cardiology

## 2022-09-13 ENCOUNTER — Ambulatory Visit: Payer: Medicare PPO | Attending: Cardiology | Admitting: Cardiology

## 2022-09-13 VITALS — BP 128/74 | HR 54 | Ht 63.0 in | Wt 121.4 lb

## 2022-09-13 DIAGNOSIS — E782 Mixed hyperlipidemia: Secondary | ICD-10-CM

## 2022-09-13 DIAGNOSIS — I4510 Unspecified right bundle-branch block: Secondary | ICD-10-CM

## 2022-09-13 DIAGNOSIS — I350 Nonrheumatic aortic (valve) stenosis: Secondary | ICD-10-CM

## 2022-09-13 DIAGNOSIS — I10 Essential (primary) hypertension: Secondary | ICD-10-CM

## 2022-09-13 MED ORDER — ASPIRIN 81 MG PO TBEC: 81.0000 mg | DELAYED_RELEASE_TABLET | Freq: Every day | ORAL | 0 refills | Status: DC

## 2022-09-13 MED ORDER — SODIUM CHLORIDE 0.9% FLUSH
3.0000 mL | Freq: Two times a day (BID) | INTRAVENOUS | Status: DC
Start: 2022-09-13 — End: 2022-09-21

## 2022-09-13 NOTE — Patient Instructions (Addendum)
Medication Instructions: Decrease your Lisinpril to 40 MG daily.    Lab Work: Monday September 17, 2022 TSH Free T4 NT3 BMET CBC PT Lab orders enclosed. Do not have to fast for blood work.   Testing/Procedures: Cardiac Cath June 7 at [facility] see instructions below.   Follow-Up: At [facility], you and your health needs are our priority.  As part of our continuing mission to provide you with exceptional heart care, we have created designated Provider Care Teams.  These Care Teams include your primary Cardiologist (physician) and Advanced Practice Providers (APPs -  Physician Assistants and Nurse Practitioners) who all work together to provide you with the care you need, when you need it.  We recommend signing up for the patient portal called "MyChart".  Sign up information is provided on this After Visit Summary.  MyChart is used to connect with patients for Virtual Visits (Telemedicine).  Patients are able to view lab/test results, encounter notes, upcoming appointments, etc.  Non-urgent messages can be sent to your provider as well.   To learn more about what you can do with MyChart, go to [email or web address].    Your next appointment:  After Cath   Provider:  Dr. [place]       [facility] A DEPT OF [place]. [facility] AT [facility] [address] [place] [id number] [place] [address] Dept: [phone] Loc: [phone]  JONICA PHILLIP  09/13/2022  You are scheduled for a Right and Left Cardiac Catheterization on Friday, June 7 with Dr. Peter [place].  1. Please arrive at the [facility] (Main Entrance A) at [facility]: [address] [place], [place] [address] at 7:00 AM (This time is 2 hour(s) before your procedure to ensure your preparation). Free valet parking service is available. You will check in at ADMITTING. The support person will be asked to wait in the waiting room.  It is OK  to have someone drop you off and come back when you are ready to be discharged.    Special note: Every effort is made to have your procedure done on time. Please understand that emergencies sometimes delay scheduled procedures.  2. Diet: Do not eat solid foods after midnight.  The patient may have clear liquids until 5am upon the day of the procedure.  3. Labs: You will need to have blood drawn on Monday September 17, 2022. Come in between 8 am- 12 PM come to Dr. Elvis Coil office [employer]. You may eat prior to labs.  4. Medication instructions in preparation for your procedure:       On the morning of your procedure, take your Aspirin 81 mg and any morning medicines NOT listed above.  You may use sips of water.  5. Plan to go home the same day, you will only stay overnight if medically necessary. 6. Bring a current list of your medications and current insurance cards. 7. You MUST have a responsible person to drive you home. 8. Someone MUST be with you the first 24 hours after you arrive home or your discharge will be delayed. 9. Please wear clothes that are easy to get on and off and wear slip-on shoes.  Thank you for allowing [place] to care for you!   -- [facility]

## 2022-09-14 NOTE — Progress Notes (Signed)
TAVR appt scheduled

## 2022-09-17 ENCOUNTER — Other Ambulatory Visit: Payer: Self-pay

## 2022-09-17 DIAGNOSIS — I350 Nonrheumatic aortic (valve) stenosis: Secondary | ICD-10-CM | POA: Diagnosis not present

## 2022-09-17 DIAGNOSIS — I10 Essential (primary) hypertension: Secondary | ICD-10-CM | POA: Diagnosis not present

## 2022-09-17 DIAGNOSIS — E782 Mixed hyperlipidemia: Secondary | ICD-10-CM | POA: Diagnosis not present

## 2022-09-17 DIAGNOSIS — I4510 Unspecified right bundle-branch block: Secondary | ICD-10-CM | POA: Diagnosis not present

## 2022-09-17 LAB — T4, FREE: Free T4: 1.82 ng/dL — ABNORMAL HIGH (ref 0.82–1.77)

## 2022-09-17 LAB — BASIC METABOLIC PANEL
Chloride: 100 mmol/L (ref 96–106)
Glucose: 123 mg/dL — ABNORMAL HIGH (ref 70–99)
Potassium: 5.7 mmol/L — ABNORMAL HIGH (ref 3.5–5.2)
Sodium: 133 mmol/L — ABNORMAL LOW (ref 134–144)

## 2022-09-17 LAB — CBC WITH DIFFERENTIAL/PLATELET

## 2022-09-17 LAB — PT AND PTT

## 2022-09-17 LAB — TSH: TSH: 2.31 u[IU]/mL (ref 0.450–4.500)

## 2022-09-17 LAB — T3: T3, Total: 72 ng/dL (ref 71–180)

## 2022-09-18 LAB — BASIC METABOLIC PANEL
BUN/Creatinine Ratio: 14 (ref 12–28)
BUN: 14 mg/dL (ref 8–27)
CO2: 25 mmol/L (ref 20–29)
Calcium: 9.4 mg/dL (ref 8.7–10.3)
Creatinine, Ser: 1.02 mg/dL — ABNORMAL HIGH (ref 0.57–1.00)
eGFR: 54 mL/min/{1.73_m2} — ABNORMAL LOW (ref >59)

## 2022-09-18 LAB — CBC WITH DIFFERENTIAL/PLATELET
Basophils Absolute: 0.1 10*3/uL (ref 0.0–0.2)
EOS (ABSOLUTE): 0.1 10*3/uL (ref 0.0–0.4)
Eos: 2 %
Hematocrit: 35.7 % (ref 34.0–46.6)
Hemoglobin: 11.6 g/dL (ref 11.1–15.9)
Immature Granulocytes: 0 %
MCH: 28.2 pg (ref 26.6–33.0)
MCHC: 32.5 g/dL (ref 31.5–35.7)
Monocytes Absolute: 0.8 10*3/uL (ref 0.1–0.9)
Neutrophils Absolute: 4.2 10*3/uL (ref 1.4–7.0)
RDW: 13.4 % (ref 11.7–15.4)
WBC: 7.5 10*3/uL (ref 3.4–10.8)

## 2022-09-18 LAB — PT AND PTT: INR: 1 (ref 0.9–1.2)

## 2022-09-20 ENCOUNTER — Telehealth: Payer: Self-pay | Admitting: *Deleted

## 2022-09-20 NOTE — Telephone Encounter (Signed)
Cardiac Catheterization scheduled at [facility] for: Friday September 21, 2022 9 AM Arrival time [facility] Main Entrance A at: 6:30 AM-needs BMP  Nothing to eat after midnight prior to procedure, clear liquids until 5 AM day of procedure.  Medication instructions: -Usual morning medications can be taken with sips of water including aspirin 81 mg.  Lisinopril stopped 09/17/22   Confirmed patient has responsible adult to drive home post procedure and be with patient first 24 hours after arriving home.  Plan to go home the same day, you will only stay overnight if medically necessary.  Reviewed procedure instructions with patient.

## 2022-09-21 ENCOUNTER — Other Ambulatory Visit: Payer: Self-pay

## 2022-09-21 ENCOUNTER — Encounter (HOSPITAL_COMMUNITY)
Admission: RE | Disposition: A | Discharge status: Home / Self Care | Payer: Self-pay | Source: Home / Self Care | Attending: Cardiology

## 2022-09-21 ENCOUNTER — Ambulatory Visit (HOSPITAL_COMMUNITY)
Admission: RE | Admit: 2022-09-21 | Discharge: 2022-09-21 | Disposition: A | Discharge status: Home / Self Care | Payer: Medicare PPO | Attending: Cardiology | Admitting: Cardiology

## 2022-09-21 DIAGNOSIS — E7800 Pure hypercholesterolemia, unspecified: Secondary | ICD-10-CM | POA: Diagnosis not present

## 2022-09-21 DIAGNOSIS — I350 Nonrheumatic aortic (valve) stenosis: Secondary | ICD-10-CM | POA: Diagnosis not present

## 2022-09-21 DIAGNOSIS — I2510 Atherosclerotic heart disease of native coronary artery without angina pectoris: Secondary | ICD-10-CM | POA: Insufficient documentation

## 2022-09-21 DIAGNOSIS — Z79899 Other long term (current) drug therapy: Secondary | ICD-10-CM | POA: Diagnosis not present

## 2022-09-21 DIAGNOSIS — I4510 Unspecified right bundle-branch block: Secondary | ICD-10-CM | POA: Insufficient documentation

## 2022-09-21 DIAGNOSIS — Z01812 Encounter for preprocedural laboratory examination: Secondary | ICD-10-CM

## 2022-09-21 DIAGNOSIS — I10 Essential (primary) hypertension: Secondary | ICD-10-CM | POA: Diagnosis not present

## 2022-09-21 DIAGNOSIS — E875 Hyperkalemia: Secondary | ICD-10-CM

## 2022-09-21 DIAGNOSIS — E119 Type 2 diabetes mellitus without complications: Secondary | ICD-10-CM | POA: Insufficient documentation

## 2022-09-21 HISTORY — PX: RIGHT/LEFT HEART CATH AND CORONARY ANGIOGRAPHY: CATH118266

## 2022-09-21 LAB — POCT I-STAT EG7
Acid-base deficit: 3 mmol/L — ABNORMAL HIGH (ref 0.0–2.0)
Acid-base deficit: 3 mmol/L — ABNORMAL HIGH (ref 0.0–2.0)
Bicarbonate: 22.4 mmol/L (ref 20.0–28.0)
Bicarbonate: 22.9 mmol/L (ref 20.0–28.0)
Calcium, Ion: 1.17 mmol/L (ref 1.15–1.40)
Calcium, Ion: 1.22 mmol/L (ref 1.15–1.40)
HCT: 32 % — ABNORMAL LOW (ref 36.0–46.0)
HCT: 33 % — ABNORMAL LOW (ref 36.0–46.0)
Hemoglobin: 10.9 g/dL — ABNORMAL LOW (ref 12.0–15.0)
Hemoglobin: 11.2 g/dL — ABNORMAL LOW (ref 12.0–15.0)
O2 Saturation: 63 %
O2 Saturation: 67 %
Potassium: 3.9 mmol/L (ref 3.5–5.1)
Potassium: 4 mmol/L (ref 3.5–5.1)
Sodium: 134 mmol/L — ABNORMAL LOW (ref 135–145)
Sodium: 135 mmol/L (ref 135–145)
TCO2: 24 mmol/L (ref 22–32)
TCO2: 24 mmol/L (ref 22–32)
pCO2, Ven: 41.1 mmHg — ABNORMAL LOW (ref 44–60)
pCO2, Ven: 42.6 mmHg — ABNORMAL LOW (ref 44–60)
pH, Ven: 7.34 (ref 7.25–7.43)
pH, Ven: 7.344 (ref 7.25–7.43)
pO2, Ven: 35 mmHg (ref 32–45)
pO2, Ven: 37 mmHg (ref 32–45)

## 2022-09-21 LAB — BASIC METABOLIC PANEL
Anion gap: 13 (ref 5–15)
BUN: 9 mg/dL (ref 8–23)
CO2: 22 mmol/L (ref 22–32)
Calcium: 9.3 mg/dL (ref 8.9–10.3)
Chloride: 97 mmol/L — ABNORMAL LOW (ref 98–111)
Creatinine, Ser: 0.87 mg/dL (ref 0.44–1.00)
GFR, Estimated: >60 mL/min (ref >60)
Glucose, Bld: 106 mg/dL — ABNORMAL HIGH (ref 70–99)
Potassium: 4.5 mmol/L (ref 3.5–5.1)
Sodium: 132 mmol/L — ABNORMAL LOW (ref 135–145)

## 2022-09-21 LAB — POCT I-STAT 7, (LYTES, BLD GAS, ICA,H+H)
Acid-base deficit: 4 mmol/L — ABNORMAL HIGH (ref 0.0–2.0)
Bicarbonate: 21.2 mmol/L (ref 20.0–28.0)
Calcium, Ion: 1.14 mmol/L — ABNORMAL LOW (ref 1.15–1.40)
HCT: 38 % (ref 36.0–46.0)
Hemoglobin: 12.9 g/dL (ref 12.0–15.0)
O2 Saturation: 94 %
Potassium: 3.8 mmol/L (ref 3.5–5.1)
Sodium: 134 mmol/L — ABNORMAL LOW (ref 135–145)
TCO2: 22 mmol/L (ref 22–32)
pCO2 arterial: 37.9 mmHg (ref 32–48)
pH, Arterial: 7.356 (ref 7.35–7.45)
pO2, Arterial: 74 mmHg — ABNORMAL LOW (ref 83–108)

## 2022-09-21 SURGERY — RIGHT/LEFT HEART CATH AND CORONARY ANGIOGRAPHY
Anesthesia: LOCAL

## 2022-09-21 MED ORDER — SODIUM CHLORIDE 0.9% FLUSH: 3.0000 mL | INTRAVENOUS | Status: DC | PRN

## 2022-09-21 MED ORDER — VERAPAMIL HCL 2.5 MG/ML IV SOLN
INTRAVENOUS | Status: AC
  Filled 2022-09-21: qty 2

## 2022-09-21 MED ORDER — HEPARIN SODIUM (PORCINE) 1000 UNIT/ML IJ SOLN
INTRAMUSCULAR | Status: AC
  Filled 2022-09-21: qty 10

## 2022-09-21 MED ORDER — MIDAZOLAM HCL 2 MG/2ML IJ SOLN
INTRAMUSCULAR | Status: DC | PRN
  Administered 2022-09-21: 1 mg via INTRAVENOUS

## 2022-09-21 MED ORDER — ACETAMINOPHEN 325 MG PO TABS: 650.0000 mg | ORAL_TABLET | ORAL | Status: DC | PRN

## 2022-09-21 MED ORDER — SODIUM CHLORIDE 0.9% FLUSH: 3.0000 mL | Freq: Two times a day (BID) | INTRAVENOUS | Status: DC

## 2022-09-21 MED ORDER — LIDOCAINE HCL (PF) 1 % IJ SOLN
INTRAMUSCULAR | Status: AC
  Filled 2022-09-21: qty 30

## 2022-09-21 MED ORDER — HEPARIN SODIUM (PORCINE) 1000 UNIT/ML IJ SOLN
INTRAMUSCULAR | Status: DC | PRN
  Administered 2022-09-21: 3000 [IU] via INTRAVENOUS

## 2022-09-21 MED ORDER — SODIUM CHLORIDE 0.9 % IV SOLN: 250.0000 mL | INTRAVENOUS | Status: DC | PRN

## 2022-09-21 MED ORDER — HEPARIN (PORCINE) IN NACL 1000-0.9 UT/500ML-% IV SOLN
INTRAVENOUS | Status: DC | PRN
  Administered 2022-09-21 (×2): 500 mL

## 2022-09-21 MED ORDER — ASPIRIN 81 MG PO CHEW: 81.0000 mg | CHEWABLE_TABLET | ORAL | Status: DC

## 2022-09-21 MED ORDER — MIDAZOLAM HCL 2 MG/2ML IJ SOLN
INTRAMUSCULAR | Status: AC
  Filled 2022-09-21: qty 2

## 2022-09-21 MED ORDER — SODIUM CHLORIDE 0.9 % WEIGHT BASED INFUSION: 1.0000 mL/kg/h | INTRAVENOUS | Status: DC

## 2022-09-21 MED ORDER — SODIUM CHLORIDE 0.9 % WEIGHT BASED INFUSION: 1.0000 mL/kg/h | INTRAVENOUS | Status: AC

## 2022-09-21 MED ORDER — SODIUM CHLORIDE 0.9 % WEIGHT BASED INFUSION
3.0000 mL/kg/h | INTRAVENOUS | Status: AC
  Administered 2022-09-21: 3 mL/kg/h via INTRAVENOUS

## 2022-09-21 MED ORDER — FENTANYL CITRATE (PF) 100 MCG/2ML IJ SOLN
INTRAMUSCULAR | Status: AC
  Filled 2022-09-21: qty 2

## 2022-09-21 MED ORDER — ONDANSETRON HCL 4 MG/2ML IJ SOLN: 4.0000 mg | Freq: Four times a day (QID) | INTRAMUSCULAR | Status: DC | PRN

## 2022-09-21 MED ORDER — FENTANYL CITRATE (PF) 100 MCG/2ML IJ SOLN
INTRAMUSCULAR | Status: DC | PRN
  Administered 2022-09-21: 25 ug via INTRAVENOUS

## 2022-09-21 MED ORDER — LIDOCAINE HCL (PF) 1 % IJ SOLN
INTRAMUSCULAR | Status: DC | PRN
  Administered 2022-09-21 (×2): 2 mL

## 2022-09-21 MED ORDER — IOHEXOL 350 MG/ML SOLN
INTRAVENOUS | Status: DC | PRN
  Administered 2022-09-21: 45 mL

## 2022-09-21 MED ORDER — HYDRALAZINE HCL 20 MG/ML IJ SOLN: 10.0000 mg | INTRAMUSCULAR | Status: DC | PRN

## 2022-09-21 MED ORDER — VERAPAMIL HCL 2.5 MG/ML IV SOLN
INTRAVENOUS | Status: DC | PRN
  Administered 2022-09-21: 10 mL via INTRA_ARTERIAL

## 2022-09-21 SURGICAL SUPPLY — 16 items
BAND CMPR LRG ZPHR (HEMOSTASIS) ×1
BAND ZEPHYR COMPRESS 30 LONG (HEMOSTASIS) IMPLANT
CATH 5FR JL3.5 JR4 ANG PIG MP (CATHETERS) IMPLANT
CATH BALLN WEDGE 5F 110CM (CATHETERS) IMPLANT
GLIDESHEATH SLEND SS 6F .021 (SHEATH) IMPLANT
GUIDEWIRE .025 260CM (WIRE) IMPLANT
GUIDEWIRE INQWIRE 1.5J.035X260 (WIRE) IMPLANT
INQWIRE 1.5J .035X260CM (WIRE) ×1
KIT HEART LEFT (KITS) ×1 IMPLANT
PACK CARDIAC CATHETERIZATION (CUSTOM PROCEDURE TRAY) ×1 IMPLANT
SHEATH GLIDE SLENDER 4/5FR (SHEATH) IMPLANT
SHEATH PROBE COVER 6X72 (BAG) IMPLANT
SYR MEDRAD MARK 7 150ML (SYRINGE) ×1 IMPLANT
TRANSDUCER W/STOPCOCK (MISCELLANEOUS) ×1 IMPLANT
TUBING CIL FLEX 10 FLL-RA (TUBING) ×1 IMPLANT
WIRE HI TORQ VERSACORE-J 145CM (WIRE) IMPLANT

## 2022-09-21 NOTE — Discharge Instructions (Signed)
Radial Site Care  This sheet gives you information about how to care for yourself after your procedure. Your health care provider may also give you more specific instructions. If you have problems or questions, contact your health care provider. What can I expect after the procedure? After the procedure, it is common to have: Bruising and tenderness at the catheter insertion area. Follow these instructions at home: Medicines Take over-the-counter and prescription medicines only as told by your health care provider. Insertion site care Follow instructions from your health care provider about how to take care of your insertion site. Make sure you: Wash your hands with soap and water before you remove your bandage (dressing). If soap and water are not available, use hand sanitizer. May remove dressing in 24 hours. Check your insertion site every day for signs of infection. Check for: Redness, swelling, or pain. Fluid or blood. Pus or a bad smell. Warmth. Do no take baths, swim, or use a hot tub for 5 days. You may shower 24-48 hours after the procedure. Remove the dressing and gently wash the site with plain soap and water. Pat the area dry with a clean towel. Do not rub the site. That could cause bleeding. Do not apply powder or lotion to the site. Activity  For 24 hours after the procedure, or as directed by your health care provider: Do not flex or bend the affected arm. Do not push or pull heavy objects with the affected arm. Do not drive yourself home from the hospital or clinic. You may drive 24 hours after the procedure. Do not operate machinery or power tools. KEEP ARM ELEVATED THE REMAINDER OF THE DAY. Do not push, pull or lift anything that is heavier than 10 lb for 5 days. Ask your health care provider when it is okay to: Return to work or school. Resume usual physical activities or sports. Resume sexual activity. General instructions If the catheter site starts to  bleed, raise your arm and put firm pressure on the site. If the bleeding does not stop, get help right away. This is a medical emergency. DRINK PLENTY OF FLUIDS FOR THE NEXT 2-3 DAYS. No alcohol consumption for 24 hours after receiving sedation. If you went home on the same day as your procedure, a responsible adult should be with you for the first 24 hours after you arrive home. Keep all follow-up visits as told by your health care provider. This is important. Contact a health care provider if: You have a fever. You have redness, swelling, or yellow drainage around your insertion site. Get help right away if: You have unusual pain at the radial site. The catheter insertion area swells very fast. The insertion area is bleeding, and the bleeding does not stop when you hold steady pressure on the area. Your arm or hand becomes pale, cool, tingly, or numb. These symptoms may represent a serious problem that is an emergency. Do not wait to see if the symptoms will go away. Get medical help right away. Call your local emergency services (911 in the [place] Do not drive yourself to the hospital. Summary After the procedure, it is common to have bruising and tenderness at the site. Follow instructions from your health care provider about how to take care of your radial site wound. Check the wound every day for signs of infection.  This information is not intended to replace advice given to you by your health care provider. Make sure you discuss any questions you have with   your health care provider. Document Revised: 05/08/2017 Document Reviewed: 05/08/2017 Elsevier Patient Education  2020 [employer]   Brachial Site Care   This sheet gives you information about how to care for yourself after your procedure. Your health care provider may also give you more specific instructions. If you have problems or questions, contact your health care provider. What can I expect after the procedure? After the  procedure, it is common to have: Bruising and tenderness at the catheter insertion area. Follow these instructions at home:  Insertion site care Follow instructions from your health care provider about how to take care of your insertion site. Make sure you: Wash your hands with soap and water before you change your bandage (dressing). If soap and water are not available, use hand sanitizer. Remove your dressing as told by your health care provider. In 24 hours Check your insertion site every day for signs of infection. Check for: Redness, swelling, or pain. Pus or a bad smell. Warmth. You may shower 24-48 hours after the procedure. Do not apply powder or lotion to the site.  Activity For 24 hours after the procedure, or as directed by your health care provider: Do not push or pull heavy objects with the affected arm. Do not drive yourself home from the hospital or clinic. You may drive 24 hours after the procedure unless your health care provider tells you not to. Do not lift anything that is heavier than 10 lb (4.5 kg), or the limit that you are told, until your health care provider says that it is safe.  For 24 hours   

## 2022-09-21 NOTE — Interval H&P Note (Signed)
History and Physical Interval Note:  09/21/2022 8:58 AM  Margaret Lucas  has presented today for surgery, with the diagnosis of aortic stenosis.  The various methods of treatment have been discussed with the patient and family. After consideration of risks, benefits and other options for treatment, the patient has consented to  Procedure(s): RIGHT/LEFT HEART CATH AND CORONARY ANGIOGRAPHY (N/A) as a surgical intervention.  The patient's history has been reviewed, patient examined, no change in status, stable for surgery.  I have reviewed the patient's chart and labs.  Questions were answered to the patient's satisfaction.     Theron Arista [facility] 09/21/2022 8:58 AM

## 2022-09-24 ENCOUNTER — Encounter: Payer: Self-pay | Admitting: Internal Medicine

## 2022-09-24 ENCOUNTER — Other Ambulatory Visit: Payer: Self-pay

## 2022-09-24 ENCOUNTER — Ambulatory Visit: Payer: Medicare PPO | Attending: Internal Medicine | Admitting: Internal Medicine

## 2022-09-24 VITALS — BP 160/70 | HR 68 | Ht 63.0 in | Wt 120.0 lb

## 2022-09-24 DIAGNOSIS — I152 Hypertension secondary to endocrine disorders: Secondary | ICD-10-CM | POA: Diagnosis not present

## 2022-09-24 DIAGNOSIS — I4510 Unspecified right bundle-branch block: Secondary | ICD-10-CM | POA: Diagnosis not present

## 2022-09-24 DIAGNOSIS — I350 Nonrheumatic aortic (valve) stenosis: Secondary | ICD-10-CM | POA: Diagnosis not present

## 2022-09-24 DIAGNOSIS — I2510 Atherosclerotic heart disease of native coronary artery without angina pectoris: Secondary | ICD-10-CM | POA: Diagnosis not present

## 2022-09-24 DIAGNOSIS — E1159 Type 2 diabetes mellitus with other circulatory complications: Secondary | ICD-10-CM | POA: Diagnosis not present

## 2022-09-24 DIAGNOSIS — E119 Type 2 diabetes mellitus without complications: Secondary | ICD-10-CM

## 2022-09-24 MED ORDER — LISINOPRIL 40 MG PO TABS: 40.0000 mg | ORAL_TABLET | Freq: Every day | ORAL | 3 refills | Status: DC

## 2022-09-24 NOTE — Progress Notes (Unsigned)
Patient ID: Margaret Lucas MRN: [id number] DOB/AGE: [id number] 88 y.o.  Primary Care Physician:Collins, [name], DO Primary Cardiologist: Sw[place]iland, Peter, MD   FOCUSED CARDIOVASCULAR PROBLEM LIST:   1.  Moderate to severe aortic stenosis with an aortic valve area of 0.8 cm indexed aortic valve area 0.54 cm/m and a mean gradient of 32.5 mmHg on invasive assessment June 2024; EKG demonstrates right bundle branch block 2.  Diet-controlled diabetes 3.  Hypertension 4.  Hypothyroidism  5.  Mild obstructive coronary artery disease   HISTORY OF PRESENT ILLNESS: The patient is a 88 y.o. female with the indicated medical history here for recommendations regarding her aortic valvular disease.  She was seen by Dr. Swaziland in May due to increasing dyspnea and fatigue.  The patient tells me that she has slowed down over the past several years but more so of this year.  She tells me she has to take more frequent breaks when she walks.  She used to be able to do more strenuous activities of daily living such as mopping her floor or doing yard work but she is unable to do this now due to increasing shortness of breath and fatigue.  She occasionally gets lightheaded at times as well.  She has noticed some orthopnea at times.  She denies any frank syncope.  She notes some chronic right lower extremity edema.  She fortunately has not required any emergency room visits or hospitalizations.  She does see a dentist on a regular basis and reports good dental health.  She only has a few remaining teeth.  Most recently the patient had her lisinopril 80 mg stopped due to hyperkalemia.  Her potassium recently was 4.5.  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   Osteoporosis    Vertigo     Past Surgical History:  Procedure Laterality Date   CHOLECYSTECTOMY     COLONOSCOPY  2010 last    ESOPHAGOGASTRODUODENOSCOPY N/A 08/08/2016   Procedure: ESOPHAGOGASTRODUODENOSCOPY (EGD);  Surgeon: Iva Boop, MD;  Location: [facility] ENDOSCOPY;  Service: Endoscopy;  Laterality: N/A;   RIGHT/LEFT HEART CATH AND CORONARY ANGIOGRAPHY N/A 09/21/2022   Procedure: RIGHT/LEFT HEART CATH AND CORONARY ANGIOGRAPHY;  Surgeon: Swaziland, Peter M, MD;  Location: [facility] INVASIVE CV LAB;  Service: Cardiovascular;  Laterality: N/A;   SKIN CANCER EXCISION     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    TUBAL LIGATION      Family History  Problem Relation Age of Onset   Stroke Mother    Cancer Sister    Breast cancer Sister 10   Pneumonia Father    Kidney failure Brother    Aortic stenosis Brother     Social History   Socioeconomic History   Marital status: Widowed    Spouse name: Not on file   Number of children: 3   Years of education: Not on file   Highest education level: Not on file  Occupational History   Occupation: retired Runner, broadcasting/film/video  Tobacco Use   Smoking status: Never   Smokeless tobacco: Never  Substance and Sexual Activity   Alcohol use: No   Drug use: No   Sexual activity: Not on file  Other Topics Concern   Not on file  Social History Narrative   Not on file   Social Determinants of Health   Financial Resource Strain: Not  on file  Food Insecurity: No Food Insecurity (12/20/2021)   Hunger Vital Sign    Worried About Running Out of Food in the Last Year: Never true    Ran Out of Food in the Last Year: Never true  Transportation Needs: Not on file  Physical Activity: Not on file  Stress: Not on file  Social Connections: Not on file  Intimate Partner Violence: Not on file     Prior to Admission medications   Medication Sig Start Date End Date Taking? Authorizing Provider  amLODipine (NORVASC) 10 MG tablet Take 10 mg by mouth daily.    [provider]  aspirin EC 81 MG tablet Take 1 tablet (81 mg total) by mouth daily. Swallow whole. Patient taking  differently: Take 81 mg by mouth once. Swallow whole. 09/13/22   Swaziland, Peter M, MD  Cholecalciferol (VITAMIN D) 125 MCG (5000 UT) CAPS Take 5,000 Units by mouth daily.    [provider]  fexofenadine (ALLEGRA) 180 MG tablet Take 180 mg by mouth daily as needed for allergies.    [provider]  fluticasone (FLONASE) 50 MCG/ACT nasal spray Place 1-2 sprays into both nostrils as needed.    [provider]  levothyroxine (SYNTHROID, LEVOTHROID) 100 MCG tablet Take 100 mcg by mouth daily before breakfast.    [provider]  meclizine (ANTIVERT) 25 MG tablet Take 1 tablet (25 mg total) by mouth 3 (three) times daily as needed for dizziness. 07/16/16   Long, Arlyss Repress, MD  Multiple Vitamins-Minerals (ICAPS AREDS FORMULA PO) Take 1 tablet by mouth 2 (two) times daily.    [provider]  pantoprazole (PROTONIX) 40 MG tablet Take 1 tablet (40 mg total) by mouth 2 (two) times daily. Patient taking differently: Take 40 mg by mouth daily. 08/11/16   Pearson Grippe, MD    Allergies  Allergen Reactions   Chlorthalidone     Other reaction(s): hyponatremia  caused hospital stay   Demerol [Meperidine] Other (See Comments)    unknown    REVIEW OF SYSTEMS:  General: no fevers/chills/night sweats Eyes: no blurry vision, diplopia, or amaurosis ENT: no sore throat or hearing loss Resp: no cough, wheezing, or hemoptysis CV: no edema or palpitations GI: no abdominal pain, nausea, vomiting, diarrhea, or constipation GU: no dysuria, frequency, or hematuria Skin: no rash Neuro: no headache, numbness, tingling, or weakness of extremities Musculoskeletal: no joint pain or swelling Heme: no bleeding, DVT, or easy bruising Endo: no polydipsia or polyuria  BP (!) 160/70   Pulse 68   Ht 5\' 3"  (1.6 m)   Wt 120 lb (54.4 kg)   BMI 21.26 kg/m   HYPERTENSION CONTROL Vitals:   09/24/22 0838 09/24/22 0935  BP: (!) 162/78 (!) 160/70    The patient's blood pressure is  elevated above target today. {Click here if intervention needs to be changed Refresh Note :1}  In order to address the patient's elevated BP: A new medication was prescribed today.       PHYSICAL EXAM: GEN:  AO x 3 in no acute distress HEENT: normal Dentition: Few remaining teech Neck: JVP normal. +2 carotid upstrokes without bruits. No thyromegaly. Lungs: equal expansion, clear bilaterally CV: Apex is discrete and nondisplaced, RRR with 3/6 SEM Abd: soft, non-tender, non-distended; no bruit; positive bowel sounds Ext: no edema, ecchymoses, or cyanosis Vascular: 2+ femoral pulses, 2+ radial pulses       Skin: warm and dry without rash Neuro: CN II-XII grossly intact; motor and sensory  grossly intact    DATA AND STUDIES:  EKG: Sinus rhythm with right bundle branch block  2D ECHO: May 2024 1. Moderate aortic stenosis.   2. Left ventricular ejection fraction, by estimation, is 60 to 65%. The  left ventricle has normal function. The left ventricle has no regional  wall motion abnormalities. Left ventricular diastolic parameters are  consistent with Grade I diastolic  dysfunction (impaired relaxation).   3. Right ventricular systolic function is normal. The right ventricular  size is normal. There is normal pulmonary artery systolic pressure.   4. The mitral valve is normal in structure. Mild mitral valve  regurgitation. No evidence of mitral stenosis.   5. The aortic valve is calcified. There is moderate calcification of the  aortic valve. There is moderate thickening of the aortic valve. Aortic  valve regurgitation is mild. Moderate aortic valve stenosis. Aortic valve  mean gradient measures 30.0 mmHg.  Aortic valve Vmax measures 3.58 m/s.   6. The inferior vena cava is normal in size with greater than 50%  respiratory variability, suggesting right atrial pressure of 3 mmHg.   CARDIAC CATH: June 2024 Nonobstructive CAD Normal LV filling pressures. LVEDP 13 mm Hg, PCWP  16/7 with mean 8 mm Hg Normal right heart pressures. PAP 28/3 mean 13 mm Hg Normal cardiac output 4.51 L/min, index 2.91 Severe aortic stenosis. Mean gradient 32.5 mm Hg. AVA 0.84 cm squared with index 0.54  STS RISK CALCULATOR: Pending  NHYA CLASS: 2    ASSESSMENT AND PLAN:   Nonrheumatic aortic valve stenosis  Type 2 diabetes mellitus without complication, without long-term current use of insulin (HCC)  Hypertension associated with diabetes (HCC)  RBBB  Mild CAD  The patient has developed NYHA class II symptoms of shortness of breath.  I reviewed her echocardiogram which demonstrates moderate aortic stenosis however her invasive assessment demonstrated moderate to severe aortic stenosis with an aortic valve area indexed of less than 0.6 cm/m and a mean gradient of 32 mmHg.  Given her ongoing symptomatology along with his hemodynamic data I think it makes sense to proceed with an aortic valve intervention.  We will refer her for TAVR protocol CTA and a cardiothoracic surgical assessment.  She does have a right bundle branch block show she is at increased risk for requiring a permanent pacemaker following the procedure.  We will need to assess her membranous septum length to inform implant depth and we will plan on right IJ pacing if a TAVR is pursued.  Given poorly controlled blood pressure will restart lisinopril 40 mg and check a BMP next week.  I have personally reviewed the patients imaging data as summarized above.  I have reviewed the natural history of aortic stenosis with the patient and family members who are present today. We have discussed the limitations of medical therapy and the poor prognosis associated with symptomatic aortic stenosis. We have also reviewed potential treatment options, including palliative medical therapy, conventional surgical aortic valve replacement, and transcatheter aortic valve replacement. We discussed treatment options in the context of this  patient's specific comorbid medical conditions.   All of the patient's questions were answered today. Will make further recommendations based on the results of studies outlined above.   Total time spent with patient today 40 minutes. This includes reviewing records, evaluating the patient and coordinating care.   Orbie Pyo, MD  09/24/2022 9:43 AM    [facility] [address] [place], [place], [place]  [address] Phone: [phone])  [phone]; Fax: [phone]

## 2022-09-24 NOTE — Progress Notes (Addendum)
Pre Surgical Assessment: 5 M Walk Test  3M=16.73ft  5 Meter Walk Test- trial 1: 9.23 seconds 5 Meter Walk Test- trial 2: 7.53 seconds 5 Meter Walk Test- trial 3: 8.53 seconds 5 Meter Walk Test Average: 8.43 seconds  ______________________  Procedure Type: Isolated AVR PERIOPERATIVE OUTCOME ESTIMATE % Operative Mortality 5.43% Morbidity & Mortality 9.06% Stroke 2.49% Renal Failure 1.7% Reoperation 3.29% Prolonged Ventilation 4.86% Deep Sternal Wound Infection 0.039% Long Hospital Stay (>14 days) 6.81% Short Hospital Stay (<6 days)* 32.1%

## 2022-09-24 NOTE — Patient Instructions (Addendum)
Medication Instructions:  Stay on the aspirin 81 mg daily. Restart lisinopril 40 mg - one tablet daily Continue all other medications  *If you need a refill on your cardiac medications before your next appointment, please call your pharmacy*   Lab Work: None today   Testing/Procedures: CT scans as planned - see instruction letter provided today.   Follow-Up: Per Structural Heart Team

## 2022-10-02 ENCOUNTER — Ambulatory Visit: Payer: Medicare PPO

## 2022-10-02 ENCOUNTER — Ambulatory Visit (HOSPITAL_COMMUNITY)
Admission: RE | Admit: 2022-10-02 | Discharge: 2022-10-02 | Disposition: A | Discharge status: Home / Self Care | Payer: Medicare PPO | Source: Ambulatory Visit | Attending: Diagnostic Radiology | Admitting: Diagnostic Radiology

## 2022-10-02 DIAGNOSIS — I152 Hypertension secondary to endocrine disorders: Secondary | ICD-10-CM | POA: Insufficient documentation

## 2022-10-02 DIAGNOSIS — Z01818 Encounter for other preprocedural examination: Secondary | ICD-10-CM | POA: Diagnosis not present

## 2022-10-02 DIAGNOSIS — I350 Nonrheumatic aortic (valve) stenosis: Secondary | ICD-10-CM | POA: Diagnosis not present

## 2022-10-02 DIAGNOSIS — R911 Solitary pulmonary nodule: Secondary | ICD-10-CM | POA: Diagnosis not present

## 2022-10-02 DIAGNOSIS — E1159 Type 2 diabetes mellitus with other circulatory complications: Secondary | ICD-10-CM | POA: Insufficient documentation

## 2022-10-02 DIAGNOSIS — R918 Other nonspecific abnormal finding of lung field: Secondary | ICD-10-CM | POA: Diagnosis not present

## 2022-10-02 DIAGNOSIS — Z7689 Persons encountering health services in other specified circumstances: Secondary | ICD-10-CM | POA: Diagnosis not present

## 2022-10-02 DIAGNOSIS — I2510 Atherosclerotic heart disease of native coronary artery without angina pectoris: Secondary | ICD-10-CM | POA: Insufficient documentation

## 2022-10-02 DIAGNOSIS — Z9049 Acquired absence of other specified parts of digestive tract: Secondary | ICD-10-CM | POA: Diagnosis not present

## 2022-10-02 LAB — BASIC METABOLIC PANEL
BUN/Creatinine Ratio: 13 (ref 12–28)
BUN: 11 mg/dL (ref 8–27)
CO2: 23 mmol/L (ref 20–29)
Calcium: 9.9 mg/dL (ref 8.7–10.3)
Chloride: 100 mmol/L (ref 96–106)
Creatinine, Ser: 0.88 mg/dL (ref 0.57–1.00)
Glucose: 102 mg/dL — ABNORMAL HIGH (ref 70–99)
Potassium: 4.6 mmol/L (ref 3.5–5.2)
Sodium: 137 mmol/L (ref 134–144)
eGFR: 64 mL/min/{1.73_m2} (ref >59)

## 2022-10-02 MED ORDER — IOHEXOL 350 MG/ML SOLN
100.0000 mL | Freq: Once | INTRAVENOUS | Status: AC | PRN
  Administered 2022-10-02: 100 mL via INTRAVENOUS

## 2022-10-04 ENCOUNTER — Encounter: Payer: Self-pay | Admitting: Physician Assistant

## 2022-10-05 ENCOUNTER — Ambulatory Visit: Payer: Medicare PPO | Admitting: Nurse Practitioner

## 2022-10-08 ENCOUNTER — Other Ambulatory Visit: Payer: Self-pay | Admitting: Family Medicine

## 2022-10-08 DIAGNOSIS — Z1231 Encounter for screening mammogram for malignant neoplasm of breast: Secondary | ICD-10-CM

## 2022-10-09 ENCOUNTER — Ambulatory Visit
Admission: RE | Admit: 2022-10-09 | Discharge: 2022-10-09 | Disposition: A | Discharge status: Home / Self Care | Payer: Medicare PPO | Source: Ambulatory Visit | Attending: Family Medicine | Admitting: Family Medicine

## 2022-10-09 DIAGNOSIS — Z1231 Encounter for screening mammogram for malignant neoplasm of breast: Secondary | ICD-10-CM

## 2022-10-15 ENCOUNTER — Encounter: Payer: Medicare PPO | Admitting: Surgery

## 2022-10-16 ENCOUNTER — Other Ambulatory Visit: Payer: Self-pay | Admitting: Physician Assistant

## 2022-10-16 ENCOUNTER — Encounter: Payer: Self-pay | Admitting: Physician Assistant

## 2022-10-16 DIAGNOSIS — I350 Nonrheumatic aortic (valve) stenosis: Secondary | ICD-10-CM

## 2022-10-17 ENCOUNTER — Institutional Professional Consult (permissible substitution): Payer: Medicare PPO | Admitting: Surgery

## 2022-10-17 VITALS — BP 170/70 | HR 80 | Resp 20 | Ht 63.0 in | Wt 120.0 lb

## 2022-10-17 DIAGNOSIS — I350 Nonrheumatic aortic (valve) stenosis: Secondary | ICD-10-CM | POA: Diagnosis not present

## 2022-10-17 NOTE — Progress Notes (Signed)
[facility]   [facility]VE CLINIC       [address] [place] [place]       [name] [address]             [phone]          CARDIOTHORACIC SURGERY CONSULTATION REPORT  PCP is Irena Reichmann, DO Referring Provider is Alverda Skeans, MD Primary Cardiologist is Peter Swaziland, mD  Reason for consultation: Severe aortic stenosis  HPI:  The patient is an 88 year old woman with a history of type 2 diabetes, hypertension, hypothyroidism, and aortic stenosis who was referred for consideration of TAVR.  She has been followed by Dr. Peter Swaziland and had an echocardiogram in January 2022 showing mild aortic stenosis with a mean gradient of 17 mmHg and a valve area of by VTI of 1.65 cm.  There is mild to moderate aortic insufficiency at that time with a pressure half-time of 440 ms.  Stroke-volume index was 35.  Left ventricular ejection fraction was normal with grade 1 diastolic dysfunction.  She now presents with progressive exertional fatigue and shortness of breath which has been worsening over the past year.  She said that over the past week she has felt much worse.  She has had some chest tightness with exertion.  She has occasional episodes of dizziness.  She has had bilateral lower extremity edema.  Repeat echocardiogram on 08/30/2022 showed an increase in the aortic valve mean gradient to 30 mmHg with a peak of 51.  Valve area was inaccurate.  Cardiac catheterization showed nonobstructive coronary disease with a mean gradient across aortic valve of 32.5 mmHg with a valve area of 0.84 cm.  She is here today with her son.  She lives alone and is independent taking care of her house.  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   Osteoporosis    Severe aortic stenosis    Vertigo     Past Surgical History:  Procedure Laterality Date   CHOLECYSTECTOMY      COLONOSCOPY  2010 last   ESOPHAGOGASTRODUODENOSCOPY N/A 08/08/2016   Procedure: ESOPHAGOGASTRODUODENOSCOPY (EGD);  Surgeon: Iva Boop, MD;  Location: [facility] ENDOSCOPY;  Service: Endoscopy;  Laterality: N/A;   RIGHT/LEFT HEART CATH AND CORONARY ANGIOGRAPHY N/A 09/21/2022   Procedure: RIGHT/LEFT HEART CATH AND CORONARY ANGIOGRAPHY;  Surgeon: Swaziland, Peter M, MD;  Location: [facility] INVASIVE CV LAB;  Service: Cardiovascular;  Laterality: N/A;   SKIN CANCER EXCISION     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    TUBAL LIGATION      Family History  Problem Relation Age of Onset   Stroke Mother    Cancer Sister    Breast cancer Sister 55   Pneumonia Father    Kidney failure Brother    Aortic stenosis Brother     Social History   Socioeconomic History   Marital status: Widowed    Spouse name: Not on file   Number of children: 3   Years of education: Not on file   Highest education level: Not on file  Occupational History   Occupation: retired Runner, broadcasting/film/video  Tobacco Use   Smoking status: Never   Smokeless tobacco: Never  Substance and Sexual Activity   Alcohol use: No   Drug use: No   Sexual activity: Not on file  Other Topics Concern   Not on file  Social  History Narrative   Not on file   Social Determinants of Health   Financial Resource Strain: Not on file  Food Insecurity: No Food Insecurity (12/20/2021)   Hunger Vital Sign    Worried About Running Out of Food in the Last Year: Never true    Ran Out of Food in the Last Year: Never true  Transportation Needs: Not on file  Physical Activity: Not on file  Stress: Not on file  Social Connections: Not on file  Intimate Partner Violence: Not on file    Prior to Admission medications   Medication Sig Start Date End Date Taking? Authorizing Provider  amLODipine (NORVASC) 10 MG tablet Take 10 mg by mouth at bedtime.   Yes [provider]  aspirin EC 81 MG tablet Take 1 tablet (81 mg total) by mouth  daily. Swallow whole. Patient taking differently: Take 81 mg by mouth at bedtime. Swallow whole. 09/13/22  Yes Swaziland, Peter M, MD  Cholecalciferol (VITAMIN D) 125 MCG (5000 UT) CAPS Take 5,000 Units by mouth at bedtime.   Yes [provider]  fexofenadine (ALLEGRA) 180 MG tablet Take 180 mg by mouth daily as needed for allergies.   Yes [provider]  fluticasone (FLONASE) 50 MCG/ACT nasal spray Place 1-2 sprays into both nostrils daily as needed for allergies.   Yes [provider]  levothyroxine (SYNTHROID, LEVOTHROID) 100 MCG tablet Take 100 mcg by mouth daily before breakfast.   Yes [provider]  lisinopril (ZESTRIL) 40 MG tablet Take 1 tablet (40 mg total) by mouth daily. 09/24/22  Yes Orbie Pyo, MD  meclizine (ANTIVERT) 25 MG tablet Take 1 tablet (25 mg total) by mouth 3 (three) times daily as needed for dizziness. 07/16/16  Yes Long, Arlyss Repress, MD  Multiple Vitamins-Minerals (PRESERVISION AREDS 2 PO) Take 1 tablet by mouth in the morning and at bedtime.   Yes [provider]  pantoprazole (PROTONIX) 40 MG tablet Take 1 tablet (40 mg total) by mouth 2 (two) times daily. Patient taking differently: Take 40 mg by mouth daily in the afternoon. 08/11/16  Yes Pearson Grippe, MD    Current Outpatient Medications  Medication Sig Dispense Refill   amLODipine (NORVASC) 10 MG tablet Take 10 mg by mouth at bedtime.     aspirin EC 81 MG tablet Take 1 tablet (81 mg total) by mouth daily. Swallow whole. (Patient taking differently: Take 81 mg by mouth at bedtime. Swallow whole.) 32 tablet 0   Cholecalciferol (VITAMIN D) 125 MCG (5000 UT) CAPS Take 5,000 Units by mouth at bedtime.     fexofenadine (ALLEGRA) 180 MG tablet Take 180 mg by mouth daily as needed for allergies.     fluticasone (FLONASE) 50 MCG/ACT nasal spray Place 1-2 sprays into both nostrils daily as needed for allergies.     levothyroxine (SYNTHROID, LEVOTHROID) 100 MCG tablet Take 100 mcg by  mouth daily before breakfast.     lisinopril (ZESTRIL) 40 MG tablet Take 1 tablet (40 mg total) by mouth daily. 90 tablet 3   meclizine (ANTIVERT) 25 MG tablet Take 1 tablet (25 mg total) by mouth 3 (three) times daily as needed for dizziness. 30 tablet 0   Multiple Vitamins-Minerals (PRESERVISION AREDS 2 PO) Take 1 tablet by mouth in the morning and at bedtime.     pantoprazole (PROTONIX) 40 MG tablet Take 1 tablet (40 mg total) by mouth 2 (two) times daily. (Patient taking differently: Take 40 mg by mouth daily in the afternoon.)  60 tablet 0   No current facility-administered medications for this visit.    Allergies  Allergen Reactions   Chlorthalidone     Other reaction(s): hyponatremia  caused hospital stay   Demerol [Meperidine] Other (See Comments)    unknown      Review of Systems:   General:  + decreased appetite, + decreased energy, no weight gain, + weight loss, no fever  Cardiac:  + chest pain with exertion, no chest pain at rest, +SOB with mild exertion, no resting SOB, no PND, + orthopnea, no palpitations, no arrhythmia, no atrial fibrillation, + LE edema, + dizzy spells, no syncope  Respiratory:  +  shortness of breath, no home oxygen, no productive cough, no dry cough, no bronchitis, no wheezing, no hemoptysis, no asthma, no pain with inspiration or cough, no sleep apnea, no CPAP at night  GI:   no difficulty swallowing, + reflux, no frequent heartburn, no hiatal hernia, no abdominal pain, no constipation, no diarrhea, no hematochezia, no hematemesis, no melena  GU:   no dysuria,  + frequency, no urinary tract infection, no hematuria, no kidney stones, no kidney disease  Vascular:  no pain suggestive of claudication, no pain in feet, + leg cramps, no varicose veins, no DVT, no non-healing foot ulcer  Neuro:   no stroke, no TIA's, no seizures, no headaches, no temporary blindness one eye,  no slurred speech, no peripheral neuropathy, no chronic pain, no instability of gait,  no memory/cognitive dysfunction  Musculoskeletal: + arthritis, no joint swelling, no myalgias, no difficulty walking, normal mobility   Skin:   no rash, no itching, no skin infections, no pressure sores or ulcerations  Psych:   no anxiety, no depression, no nervousness, no unusual recent stress  Eyes:   + blurry vision, + floaters, no recent vision changes, + wears glasses   ENT:   + hearing loss, no loose or painful teeth, + dentures, last saw dentist Jan 2024  Hematologic:  + easy bruising, no abnormal bleeding, no clotting disorder, no frequent epistaxis  Endocrine:  + borderline diabetes, does not check CBG's at home     Physical Exam:   BP (!) 170/70   Pulse 80   Resp 20   Ht 5\' 3"  (1.6 m)   Wt 120 lb (54.4 kg)   SpO2 99% Comment: RA  BMI 21.26 kg/m   General:  Elderly,  well-appearing  HEENT:  Unremarkable, NCAT, PERLA, EOMI  Neck:   no JVD, no bruits, no adenopathy  Chest:   clear to auscultation, symmetrical breath sounds, no wheezes, no rhonchi   CV:   RRR, 3/6 systolic murmur RSB, no diastolic murmur  Abdomen:  soft, non-tender, no masses   Extremities:  warm, well-perfused, pedal pulses palpable, trace lower extremity edema  Rectal/GU  Deferred  Neuro:   Grossly non-focal and symmetrical throughout  Skin:   Clean and dry, no rashes, no breakdown  Diagnostic Tests:    ECHOCARDIOGRAM REPORT       Patient Name:   Margaret Lucas Date of Exam: 08/30/2022  Medical Rec #:  [id number]       Height:       63.0 in  Accession #:    [id number]      Weight:       120.8 lb  Date of Birth:  09/27/1934      BSA:          1.561 m  Patient Age:    19 years  BP:           164/71 mmHg  Patient Gender: F               HR:           57 bpm.  Exam Location:  [address]   Procedure: 2D Echo, Cardiac Doppler, Color Doppler and Strain Analysis   Indications:    I35.0 Aortic Stenosis    History:        Patient has prior history of Echocardiogram examinations,  most                  recent 05/10/2020. Signs/Symptoms:Murmur; Risk  Factors:Diabetes.    Sonographer:   Clearence Ped RCS  Referring Phys: [id number] JESSE M CLEAVER   IMPRESSIONS     1. Moderate aortic stenosis.   2. Left ventricular ejection fraction, by estimation, is 60 to 65%. The  left ventricle has normal function. The left ventricle has no regional  wall motion abnormalities. Left ventricular diastolic parameters are  consistent with Grade I diastolic  dysfunction (impaired relaxation).   3. Right ventricular systolic function is normal. The right ventricular  size is normal. There is normal pulmonary artery systolic pressure.   4. The mitral valve is normal in structure. Mild mitral valve  regurgitation. No evidence of mitral stenosis.   5. The aortic valve is calcified. There is moderate calcification of the  aortic valve. There is moderate thickening of the aortic valve. Aortic  valve regurgitation is mild. Moderate aortic valve stenosis. Aortic valve  mean gradient measures 30.0 mmHg.  Aortic valve Vmax measures 3.58 m/s.   6. The inferior vena cava is normal in size with greater than 50%  respiratory variability, suggesting right atrial pressure of 3 mmHg.   Comparison(s): Prior images reviewed side by side. ECHO 01/22 aortic mean  17 and peak gradient 30.6 mmHg.   FINDINGS   Left Ventricle: Left ventricular ejection fraction, by estimation, is 60  to 65%. The left ventricle has normal function. The left ventricle has no  regional wall motion abnormalities. The left ventricular internal cavity  size was normal in size. There is   no left ventricular hypertrophy. Left ventricular diastolic parameters  are consistent with Grade I diastolic dysfunction (impaired relaxation).   Right Ventricle: The right ventricular size is normal. No increase in  right ventricular wall thickness. Right ventricular systolic function is  normal. There is normal pulmonary artery systolic pressure. The  tricuspid  regurgitant velocity is 2.07 m/s, and   with an assumed right atrial pressure of 3 mmHg, the estimated right  ventricular systolic pressure is 20.1 mmHg.   Left Atrium: Left atrial size was normal in size.   Right Atrium: Right atrial size was normal in size.   Pericardium: There is no evidence of pericardial effusion.   Mitral Valve: The mitral valve is normal in structure. Mild mitral valve  regurgitation. No evidence of mitral valve stenosis.   Tricuspid Valve: The tricuspid valve is normal in structure. Tricuspid  valve regurgitation is trivial. No evidence of tricuspid stenosis.   Aortic Valve: The aortic valve is calcified. There is moderate  calcification of the aortic valve. There is moderate thickening of the  aortic valve. Aortic valve regurgitation is mild. Aortic regurgitation PHT  measures 1098 msec. Moderate aortic stenosis  is present. Aortic valve mean gradient measures 30.0 mmHg. Aortic valve  peak gradient measures 51.3 mmHg. Aortic valve area, by VTI measures 3.07  cm.  Pulmonic Valve: The pulmonic valve was normal in structure. Pulmonic valve  regurgitation is not visualized. No evidence of pulmonic stenosis.   Aorta: The aortic root is normal in size and structure.   Venous: The inferior vena cava is normal in size with greater than 50%  respiratory variability, suggesting right atrial pressure of 3 mmHg.   IAS/Shunts: No atrial level shunt detected by color flow Doppler.     LEFT VENTRICLE  PLAX 2D  LVIDd:         3.90 cm   Diastology  LVIDs:         2.00 cm   LV e' medial:    5.55 cm/s  LV PW:         1.00 cm   LV E/e' medial:  11.0  LV IVS:        0.90 cm   LV e' lateral:   5.87 cm/s  LVOT diam:     2.00 cm   LV E/e' lateral: 10.4  LV SV:         95  LV SV Index:   61        2D Longitudinal Strain  LVOT Area:     3.14 cm  2D Strain GLS (A2C):   -23.5 %                           2D Strain GLS (A3C):   -20.3 %                            2D Strain GLS (A4C):   -19.9 %                           2D Strain GLS Avg:     -21.2 %   RIGHT VENTRICLE  RV Basal diam:  3.10 cm  RV S prime:     11.20 cm/s  TAPSE (M-mode): 2.3 cm  RVSP:           20.1 mmHg   LEFT ATRIUM             Index        RIGHT ATRIUM           Index  LA diam:        3.20 cm 2.05 cm/m   RA Pressure: 3.00 mmHg  LA Vol (A2C):   40.2 ml 25.76 ml/m  RA Area:     12.80 cm  LA Vol (A4C):   36.6 ml 23.45 ml/m  RA Volume:   33.20 ml  21.27 ml/m  LA Biplane Vol: 41.5 ml 26.59 ml/m   AORTIC VALVE  AV Area (Vmax):    1.07 cm  AV Area (Vmean):   3.16 cm  AV Area (VTI):     3.07 cm  AV Vmax:           358.00 cm/s  AV Vmean:          88.700 cm/s  AV VTI:            0.310 m  AV Peak Grad:      51.3 mmHg  AV Mean Grad:      30.0 mmHg  LVOT Vmax:         122.00 cm/s  LVOT Vmean:        89.100 cm/s  LVOT VTI:  0.302 m  LVOT/AV VTI ratio: 0.98  AI PHT:            1098 msec    AORTA  Ao Root diam: 2.80 cm  Ao Asc diam:  2.90 cm   MITRAL VALVE                TRICUSPID VALVE  MV Area (PHT):              TR Peak grad:   17.1 mmHg  MV Decel Time:              TR Vmax:        207.00 cm/s  MV E velocity: 61.30 cm/s   Estimated RAP:  3.00 mmHg  MV A velocity: 109.00 cm/s  RVSP:           20.1 mmHg  MV E/A ratio:  0.56                              SHUNTS                              Systemic VTI:  0.30 m                              Systemic Diam: 2.00 cm   Donato Schultz MD  Electronically signed by Donato Schultz MD  Signature Date/Time: 08/30/2022/11:54:52 AM        Final      hysicians  Panel Physicians Referring Physician Case Authorizing Physician  Swaziland, Peter M, MD (Primary)     Procedures  RIGHT/LEFT HEART CATH AND CORONARY ANGIOGRAPHY   Conclusion      Prox RCA lesion is 40% stenosed.   Mid RCA lesion is 35% stenosed.   Prox LAD to Mid LAD lesion is 30% stenosed.   LV end diastolic pressure is normal.   There is severe aortic  valve stenosis.   Nonobstructive CAD Normal LV filling pressures. LVEDP 13 mm Hg, PCWP 16/7 with mean 8 mm Hg Normal right heart pressures. PAP 28/3 mean 13 mm Hg Normal cardiac output 4.51 L/min, index 2.91 Severe aortic stenosis. Mean gradient 32.5 mm Hg. AVA 0.84 cm squared with index 0.54   Plan: refer to valvular heart team for consideration of TAVR   Indications  Nonrheumatic aortic valve stenosis [I35.0 (ICD-10-CM)]   Procedural Details  Technical Details Indication: 88 yo WF with symptomatic severe aortic stenosis  Procedural Details: The right wrist was prepped, draped, and anesthetized with 1% lidocaine. Using the modified Seldinger technique a 6 Fr slender sheath was placed in the right radial artery and a 5 French sheath was placed in the right brachial vein. A Swan-Ganz catheter was used for the right heart catheterization. Standard protocol was followed for recording of right heart pressures and sampling of oxygen saturations. Fick cardiac output was calculated. Standard Judkins catheters were used for selective coronary angiography and left ventricular pressures. There were no immediate procedural complications. The patient was transferred to the post catheterization recovery area for further monitoring.  Contrast: 45 cc Estimated blood loss <50 mL.   During this procedure medications were administered to achieve and maintain moderate conscious sedation while the patient's heart rate, blood pressure, and oxygen saturation were continuously monitored and I was present face-to-face 100% of this time.   Medications (Filter:  Administrations occurring from 0846 to 0938 on 09/21/22)  important  Continuous medications are totaled by the amount administered until 09/21/22 0938.   Heparin (Porcine) in NaCl 1000-0.9 UT/500ML-% SOLN (mL)  Total volume: 1,000 mL Date/Time Rate/Dose/Volume Action   09/21/22 0849 500 mL Given   0849 500 mL Given   fentaNYL (SUBLIMAZE) injection  (mcg)  Total dose: 25 mcg Date/Time Rate/Dose/Volume Action   09/21/22 0857 25 mcg Given   midazolam (VERSED) injection (mg)  Total dose: 1 mg Date/Time Rate/Dose/Volume Action   09/21/22 0857 1 mg Given   lidocaine (PF) (XYLOCAINE) 1 % injection (mL)  Total volume: 4 mL Date/Time Rate/Dose/Volume Action   09/21/22 0903 2 mL Given   0904 2 mL Given   Radial Cocktail/Verapamil only (mL)  Total volume: 10 mL Date/Time Rate/Dose/Volume Action   09/21/22 0904 10 mL Given   iohexol (OMNIPAQUE) 350 MG/ML injection (mL)  Total volume: 45 mL Date/Time Rate/Dose/Volume Action   09/21/22 0925 45 mL Given   heparin sodium (porcine) injection (Units)  Total dose: 3,000 Units Date/Time Rate/Dose/Volume Action   09/21/22 0919 3,000 Units Given    Sedation Time  Sedation Time Physician-1: 27 minutes 31 seconds Contrast     Administrations occurring from 0846 to 0938 on 09/21/22:  Medication Name Total Dose  iohexol (OMNIPAQUE) 350 MG/ML injection 45 mL   Radiation/Fluoro  Fluoro time: 4.2 (min) DAP: 6573 (mGycm2) Cumulative Air Kerma: 103 (mGy) Complications  Complications documented before study signed (09/21/2022  9:38 AM)   No complications were associated with this study.  Documented by Ancil Linsey, RT - 09/21/2022  9:30 AM     Coronary Findings  Diagnostic Dominance: Right Left Anterior Descending  Prox LAD to Mid LAD lesion is 30% stenosed.    Left Circumflex    First Obtuse Marginal Branch  Vessel is large in size.    Right Coronary Artery  Prox RCA lesion is 40% stenosed.  Mid RCA lesion is 35% stenosed.    Intervention   No interventions have been documented.   Left Heart  Left Ventricle LV end diastolic pressure is normal.  Aortic Valve There is severe aortic valve stenosis.    ADDENDUM REPORT: 10/02/2022 17:44   CLINICAL DATA:  Severe Aortic Stenosis.   EXAM: Cardiac TAVR CT   TECHNIQUE: A non-contrast, gated CT scan was obtained with axial  slices of 3 mm through the heart for aortic valve calcium scoring. A 120 kV retrospective, gated, contrast cardiac scan was obtained. Gantry rotation speed was 250 msecs and collimation was 0.6 mm. Nitroglycerin was not given. The 3D data set was reconstructed in 5% intervals of the 0-95% of the R-R cycle. Systolic and diastolic phases were analyzed on a dedicated workstation using MPR, MIP, and VRT modes. The patient received 100 cc of contrast.   FINDINGS: Image quality: Excellent.   Noise artifact is: Limited.   Valve Morphology: Tricuspid aortic valve with diffuse severe thickening and severe calcifications. Restricted leaflet movement in systole.   Aortic Valve Calcium score: 1120   Aortic annular dimension:   Phase assessed: 35%   Annular area: 407 mm2   Annular perimeter: 72.4 mm   Max diameter: 24.9 mm   Min diameter: 20.7 mm   Annular and subannular calcification: None.   Membranous septum length: 9.5 mm   Optimal coplanar projection: LAO 17 CAU 6   Coronary Artery Height above Annulus:   Left Main: 11.5 mm   Right Coronary: 15.1 mm   Sinus of  Valsalva Measurements:   Non-coronary: 28.5 mm   Right-coronary: 26.0 mm   Left-coronary: 27.3 mm   Sinus of Valsalva Height:   Non-coronary: 20.5 mm   Right-coronary: 20.3 mm   Left-coronary: 16.9 mm   Sinotubular Junction: 23 mm   Ascending Thoracic Aorta: 31 mm   Coronary Arteries: Normal coronary origin. Right dominance. The study was performed without use of NTG and is insufficient for plaque evaluation. Please refer to recent cardiac catheterization for coronary assessment.   Cardiac Morphology:   Right Atrium: Right atrial size is within normal limits.   Right Ventricle: The right ventricular cavity is within normal limits.   Left Atrium: Left atrial size is normal in size with no left atrial appendage filling defect.   Left Ventricle: The ventricular cavity size is within normal  limits.   Pulmonary arteries: Normal in size without proximal filling defect.   Pulmonary veins: Normal pulmonary venous drainage.   Pericardium: Normal thickness with no significant effusion or calcium present.   Mitral Valve: The mitral valve is normal structure without significant calcification.   Extra-cardiac findings: See attached radiology report for non-cardiac structures.   IMPRESSION: 1. Annular measurements support a 23 mm S3. Perimeter is in between 26/29 mm Evolut Pro. Sinuses does not support a 29 mm device, and are borderline for a 26 mm device.   2. No significant annular or subannular calcifications.   3. Sufficient coronary to annulus distance.   4. Optimal Fluoroscopic Angle for Delivery: LAO 17 CAU 6   Antietam T. Flora Lipps, MD     Electronically Signed   By: Lennie Odor M.D.   On: 10/02/2022 17:44    Addended by Sande Rives, MD on 10/02/2022  5:46 PM    Study Result  Narrative & Impression  EXAM: OVER-READ INTERPRETATION  CT CHEST   The following report is a limited chest CT over-read performed by radiologist Dr. Allegra Lai of [facility] on 10/02/2022. This over-read does not include interpretation of cardiac or coronary anatomy or pathology. The cardiac TAVR interpretation by the cardiologist is attached.   COMPARISON:  None Available.   FINDINGS: Extracardiac findings will be described separately under dictation for contemporaneously obtained CTA chest, abdomen and pelvis.   IMPRESSION: Please see separate dictation for contemporaneously obtained CTA chest, abdomen and pelvis dated 10/02/2022 for full description of relevant extracardiac findings.   Electronically Signed: By: Allegra Lai M.D. On: 10/02/2022 12:22       Narrative & Impression  CLINICAL DATA:  Preop evaluation for aortic valve replacement   EXAM: CT ANGIOGRAPHY CHEST, ABDOMEN AND PELVIS   TECHNIQUE: Multidetector CT imaging  through the chest, abdomen and pelvis was performed using the standard protocol during bolus administration of intravenous contrast. Multiplanar reconstructed images and MIPs were obtained and reviewed to evaluate the vascular anatomy.   RADIATION DOSE REDUCTION: This exam was performed according to the departmental dose-optimization program which includes automated exposure control, adjustment of the mA and/or kV according to patient size and/or use of iterative reconstruction technique.   CONTRAST:  OMNIPAQUE IOHEXOL 350 MG/ML SOLN   COMPARISON:  Chest CT dated August 07, 2016   FINDINGS: CTA CHEST FINDINGS   Cardiovascular: Normal heart size. No pericardial effusion. Moderate coronary artery calcifications. Normal caliber thoracic aorta with moderate calcified plaque. Mild coronary artery calcifications of the LAD.   Mediastinum/Nodes: Esophagus and thyroid are unremarkable. No enlarged lymph nodes seen in the chest.   Lungs/Pleura: Central airways are patent.  New right lower lobe solid nodule measuring 7 mm on series 7, image 60. No consolidation, pleural effusion or pneumothorax.   Musculoskeletal: No chest wall abnormality. No acute or significant osseous findings.   CTA ABDOMEN AND PELVIS FINDINGS   Hepatobiliary: No focal liver abnormality is seen. Status post cholecystectomy. No biliary dilatation.   Pancreas: Unremarkable. No pancreatic ductal dilatation or surrounding inflammatory changes.   Spleen: Normal in size without focal abnormality.   Adrenals/Urinary Tract: Bilateral adrenal glands are unremarkable. No hydronephrosis or nephrolithiasis. Bilateral low-attenuation renal lesions which are too small to accurately characterize but likely simple cysts, no specific follow-up imaging is necessary. Bladder is unremarkable.   Stomach/Bowel: Stomach is within normal limits. Appendix appears normal. Diverticulosis. No evidence of bowel wall  thickening, distention, or inflammatory changes.   Vascular/lymphatic: Normal caliber thoracic aorta with severe atherosclerotic disease. 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 No enlarged lymph nodes seen in the abdomen and pelvis.   Reproductive: Uterus and bilateral adnexa are unremarkable.   Other: No abdominal wall hernia or abnormality. No abdominopelvic ascites.   Musculoskeletal: No acute or significant osseous findings.   VASCULAR MEASUREMENTS PERTINENT TO TAVR:   AORTA:   Minimal Aortic Diameter-9.1 mm   Severity of Aortic Calcification-severe   RIGHT PELVIS:   Right Common Iliac Artery -   Minimal Diameter-6.3 mm   Tortuosity-mild   Calcification-severe   Right External Iliac Artery -   Minimal Diameter-5.7 mm   Tortuosity-moderate   Calcification-none   Right Common Femoral Artery -   Minimal Diameter-5.9 mm   Tortuosity-none   Calcification-moderate   LEFT PELVIS:   Left Common Iliac Artery -   Minimal Diameter-5.3 mm   Tortuosity-mild   Calcification-severe   Left External Iliac Artery -   Minimal Diameter-6.2 mm   Tortuosity-mild   Calcification-none   Left Common Femoral Artery -   Minimal Diameter-6.4 mm   Tortuosity-none   Calcification-none   Review of the MIP images confirms the above findings.   IMPRESSION: Vascular:   1. Vascular findings and measurements pertinent to potential TAVR procedure, as detailed above. 2. Thickening and calcification of the aortic valve, compatible with reported clinical history of aortic stenosis. 3. Severe aortoiliac atherosclerosis. 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 4. Mild coronary artery calcifications of the LAD.   Nonvascular:   1. New right lower lobe solid nodule measuring 7 mm.  Non-contrast chest CT at 6-12 months is recommended. If the nodule is stable at time of repeat CT, then future CT at 18-24 months (from today's scan) is considered optional for low-risk patients, but is recommended for high-risk patients. This recommendation follows the consensus statement: Guidelines for Management of Incidental Pulmonary Nodules Detected on CT Images: From the Fleischner Society 2017; Radiology 2017; 284:228-243.     Electronically Signed   By: Allegra Lai M.D.   On: 10/02/2022 13:34     Impression:  This 88 year old patient has stage D, severe, symptomatic aortic stenosis with NYHA class II symptoms of exertional fatigue and shortness of breath consistent with chronic diastolic congestive heart failure.  She has recently been having episodes of dizziness and chest discomfort.  I have personally reviewed her 2D echocardiogram, cardiac catheterization, and CTA studies.  Her echocardiogram shows a trileaflet aortic valve with moderate calcification and thickening and restricted leaflet mobility.  The mean gradient is 30 mmHg with  a peak of 51 mmHg.  Her aortic stenosis looks visually severe.  Left ventricular ejection fraction is normal.  Cardiac catheterization showed mild nonobstructive coronary disease.  The mean gradient across the aortic valve was 32.5 mmHg with a valve area of 0.84 cm.  I agree that aortic valve replacement is indicated in this patient for relief of her progressively worsening symptoms and to prevent left ventricular deterioration.  Given her advanced age I think transcatheter aortic valve replacement would be the best option for her.  Her gated cardiac CTA shows anatomy suitable for TAVR using a 23 mm SAPIEN 3 valve.  Her abdominal and pelvic CTA shows adequate pelvic vascular anatomy to allow transfemoral insertion.  The patient and her son were counseled at length regarding treatment alternatives for management of severe symptomatic aortic stenosis.  The risks and benefits of surgical intervention has been discussed in detail. Long-term prognosis with medical therapy was discussed. Alternative approaches such as conventional surgical aortic valve replacement, transcatheter aortic valve replacement, and palliative medical therapy were compared and contrasted at length. This discussion was placed in the context of the patient's own specific clinical presentation and past medical history. All of their questions have been addressed.   Following the decision to proceed with transcatheter aortic valve replacement, a discussion was held regarding what types of management strategies would be attempted intraoperatively in the event of life-threatening complications, including whether or not the patient would be considered a candidate for the use of cardiopulmonary bypass and/or conversion to open sternotomy for attempted surgical intervention.  She is 88 years old but in otherwise good condition and I would consider her to be a candidate for emergent sternotomy to manage any intraoperative complications.  The patient is aware of the fact that transient use of cardiopulmonary bypass may be necessary. The patient has been advised of a variety of complications that might develop including but not limited to risks of death, stroke, paravalvular leak, aortic dissection or other major vascular complications, aortic annulus rupture, device embolization, cardiac rupture or perforation, mitral regurgitation, acute myocardial infarction, arrhythmia, heart block or bradycardia requiring permanent pacemaker placement, congestive heart failure, respiratory failure, renal failure, pneumonia, infection, other late complications related to structural valve deterioration or migration, or other complications that might ultimately cause a temporary or permanent loss of functional independence or other long term morbidity. The patient provides full informed consent for the procedure as  described and all questions were answered.      Plan:  She will be scheduled for transfemoral TAVR using a SAPIEN 3 valve on Tuesday, 10/23/2022.  I spent 60 minutes performing this consultation and > 50% of this time was spent face to face counseling and coordinating the care of this patient's severe symptomatic aortic stenosis.   [name], MD 10/17/2022

## 2022-10-19 DIAGNOSIS — H353231 Exudative age-related macular degeneration, bilateral, with active choroidal neovascularization: Secondary | ICD-10-CM | POA: Diagnosis not present

## 2022-10-22 ENCOUNTER — Encounter (HOSPITAL_COMMUNITY)
Admission: RE | Admit: 2022-10-22 | Discharge: 2022-10-22 | Disposition: A | Discharge status: Home / Self Care | Payer: Medicare PPO | Source: Ambulatory Visit | Attending: Internal Medicine | Admitting: Internal Medicine

## 2022-10-22 ENCOUNTER — Ambulatory Visit (HOSPITAL_COMMUNITY)
Admission: RE | Admit: 2022-10-22 | Discharge: 2022-10-22 | Disposition: A | Discharge status: Home / Self Care | Payer: Medicare PPO | Source: Ambulatory Visit | Attending: Physician Assistant | Admitting: Physician Assistant

## 2022-10-22 VITALS — Ht 63.0 in | Wt 120.0 lb

## 2022-10-22 DIAGNOSIS — Z952 Presence of prosthetic heart valve: Secondary | ICD-10-CM | POA: Diagnosis not present

## 2022-10-22 DIAGNOSIS — I4510 Unspecified right bundle-branch block: Secondary | ICD-10-CM | POA: Diagnosis present

## 2022-10-22 DIAGNOSIS — Z85828 Personal history of other malignant neoplasm of skin: Secondary | ICD-10-CM | POA: Diagnosis not present

## 2022-10-22 DIAGNOSIS — Z1152 Encounter for screening for COVID-19: Secondary | ICD-10-CM | POA: Insufficient documentation

## 2022-10-22 DIAGNOSIS — Z803 Family history of malignant neoplasm of breast: Secondary | ICD-10-CM | POA: Diagnosis not present

## 2022-10-22 DIAGNOSIS — E039 Hypothyroidism, unspecified: Secondary | ICD-10-CM | POA: Diagnosis present

## 2022-10-22 DIAGNOSIS — Z7982 Long term (current) use of aspirin: Secondary | ICD-10-CM | POA: Diagnosis not present

## 2022-10-22 DIAGNOSIS — Z8719 Personal history of other diseases of the digestive system: Secondary | ICD-10-CM | POA: Diagnosis not present

## 2022-10-22 DIAGNOSIS — Z006 Encounter for examination for normal comparison and control in clinical research program: Secondary | ICD-10-CM | POA: Diagnosis not present

## 2022-10-22 DIAGNOSIS — Z01818 Encounter for other preprocedural examination: Secondary | ICD-10-CM | POA: Diagnosis not present

## 2022-10-22 DIAGNOSIS — I2510 Atherosclerotic heart disease of native coronary artery without angina pectoris: Secondary | ICD-10-CM | POA: Diagnosis present

## 2022-10-22 DIAGNOSIS — I350 Nonrheumatic aortic (valve) stenosis: Secondary | ICD-10-CM | POA: Insufficient documentation

## 2022-10-22 DIAGNOSIS — E119 Type 2 diabetes mellitus without complications: Secondary | ICD-10-CM | POA: Diagnosis present

## 2022-10-22 DIAGNOSIS — R911 Solitary pulmonary nodule: Secondary | ICD-10-CM | POA: Diagnosis present

## 2022-10-22 DIAGNOSIS — Z823 Family history of stroke: Secondary | ICD-10-CM | POA: Diagnosis not present

## 2022-10-22 DIAGNOSIS — I351 Nonrheumatic aortic (valve) insufficiency: Secondary | ICD-10-CM | POA: Diagnosis not present

## 2022-10-22 DIAGNOSIS — I700 Atherosclerosis of aorta: Secondary | ICD-10-CM | POA: Diagnosis not present

## 2022-10-22 DIAGNOSIS — E871 Hypo-osmolality and hyponatremia: Secondary | ICD-10-CM | POA: Diagnosis present

## 2022-10-22 DIAGNOSIS — I110 Hypertensive heart disease with heart failure: Secondary | ICD-10-CM | POA: Diagnosis present

## 2022-10-22 DIAGNOSIS — I5032 Chronic diastolic (congestive) heart failure: Secondary | ICD-10-CM | POA: Diagnosis present

## 2022-10-22 DIAGNOSIS — Z841 Family history of disorders of kidney and ureter: Secondary | ICD-10-CM | POA: Diagnosis not present

## 2022-10-22 DIAGNOSIS — I10 Essential (primary) hypertension: Secondary | ICD-10-CM | POA: Diagnosis not present

## 2022-10-22 DIAGNOSIS — Z79899 Other long term (current) drug therapy: Secondary | ICD-10-CM | POA: Diagnosis not present

## 2022-10-22 DIAGNOSIS — M810 Age-related osteoporosis without current pathological fracture: Secondary | ICD-10-CM | POA: Diagnosis present

## 2022-10-22 DIAGNOSIS — Z79890 Hormone replacement therapy (postmenopausal): Secondary | ICD-10-CM | POA: Diagnosis not present

## 2022-10-22 LAB — COMPREHENSIVE METABOLIC PANEL
ALT: 19 U/L (ref 0–44)
AST: 27 U/L (ref 15–41)
Albumin: 3.9 g/dL (ref 3.5–5.0)
Alkaline Phosphatase: 74 U/L (ref 38–126)
Anion gap: 9 (ref 5–15)
BUN: 8 mg/dL (ref 8–23)
CO2: 24 mmol/L (ref 22–32)
Calcium: 9.2 mg/dL (ref 8.9–10.3)
Chloride: 102 mmol/L (ref 98–111)
Creatinine, Ser: 0.89 mg/dL (ref 0.44–1.00)
GFR, Estimated: >60 mL/min (ref >60)
Glucose, Bld: 172 mg/dL — ABNORMAL HIGH (ref 70–99)
Potassium: 3.7 mmol/L (ref 3.5–5.1)
Sodium: 135 mmol/L (ref 135–145)
Total Bilirubin: 0.4 mg/dL (ref 0.3–1.2)
Total Protein: 7.2 g/dL (ref 6.5–8.1)

## 2022-10-22 LAB — URINALYSIS, ROUTINE W REFLEX MICROSCOPIC
Bilirubin Urine: NEGATIVE
Glucose, UA: NEGATIVE mg/dL
Hgb urine dipstick: NEGATIVE
Ketones, ur: NEGATIVE mg/dL
Nitrite: NEGATIVE
Protein, ur: NEGATIVE mg/dL
Specific Gravity, Urine: 1.01 (ref 1.005–1.030)
pH: 5 (ref 5.0–8.0)

## 2022-10-22 LAB — CBC
HCT: 37.5 % (ref 36.0–46.0)
Hemoglobin: 12.1 g/dL (ref 12.0–15.0)
MCH: 28.2 pg (ref 26.0–34.0)
MCHC: 32.3 g/dL (ref 30.0–36.0)
MCV: 87.4 fL (ref 80.0–100.0)
Platelets: 267 10*3/uL (ref 150–400)
RBC: 4.29 MIL/uL (ref 3.87–5.11)
RDW: 13 % (ref 11.5–15.5)
WBC: 7.7 10*3/uL (ref 4.0–10.5)
nRBC: 0 % (ref 0.0–0.2)

## 2022-10-22 LAB — TYPE AND SCREEN
ABO/RH(D): O POS
Antibody Screen: NEGATIVE

## 2022-10-22 LAB — PROTIME-INR
INR: 1.1 (ref 0.8–1.2)
Prothrombin Time: 14.3 seconds (ref 11.4–15.2)

## 2022-10-22 LAB — SARS CORONAVIRUS 2 BY RT PCR: SARS Coronavirus 2 by RT PCR: NEGATIVE

## 2022-10-22 LAB — SURGICAL PCR SCREEN
MRSA, PCR: NEGATIVE
Staphylococcus aureus: POSITIVE — AB

## 2022-10-22 MED ORDER — CEFAZOLIN SODIUM-DEXTROSE 2-4 GM/100ML-% IV SOLN
2.0000 g | INTRAVENOUS | Status: AC
  Administered 2022-10-23: 2 g via INTRAVENOUS
  Filled 2022-10-22: qty 100

## 2022-10-22 MED ORDER — DEXMEDETOMIDINE HCL IN NACL 400 MCG/100ML IV SOLN
0.1000 ug/kg/h | INTRAVENOUS | Status: AC
  Administered 2022-10-23: 27.2 ug via INTRAVENOUS
  Filled 2022-10-22: qty 100

## 2022-10-22 MED ORDER — NOREPINEPHRINE 4 MG/250ML-% IV SOLN
0.0000 ug/min | INTRAVENOUS | Status: AC
  Administered 2022-10-23: 2 ug/min via INTRAVENOUS
  Filled 2022-10-22: qty 250

## 2022-10-22 MED ORDER — POTASSIUM CHLORIDE 2 MEQ/ML IV SOLN
80.0000 meq | INTRAVENOUS | Status: DC
  Filled 2022-10-22: qty 40

## 2022-10-22 MED ORDER — HEPARIN 30,000 UNITS/1000 ML (OHS) CELLSAVER SOLUTION
Status: DC
  Filled 2022-10-22: qty 1000

## 2022-10-22 MED ORDER — MAGNESIUM SULFATE 50 % IJ SOLN
40.0000 meq | INTRAMUSCULAR | Status: DC
  Filled 2022-10-22: qty 9.85

## 2022-10-22 NOTE — Progress Notes (Signed)
Patient was called to inform that the surgery time for tomorrow was change to 09:45 o'clock. Patient was instructed to be at the hospital at 07:15 o'clock. Patient verbalized understanding.

## 2022-10-22 NOTE — H&P (Signed)
[address]       [name] [address]             [phone]      Cardiothoracic Surgery Admission History and Physical   PCP is Irena Reichmann, DO Referring Provider is Alverda Skeans, MD Primary Cardiologist is Peter Swaziland, mD   Reason for admission: Severe aortic stenosis   HPI:   The patient is an 88 year old woman with a history of type 2 diabetes, hypertension, hypothyroidism, and aortic stenosis who was referred for consideration of TAVR.  She has been followed by Dr. Peter Swaziland and had an echocardiogram in January 2022 showing mild aortic stenosis with a mean gradient of 17 mmHg and a valve area of by VTI of 1.65 cm.  There is mild to moderate aortic insufficiency at that time with a pressure half-time of 440 ms.  Stroke-volume index was 35.  Left ventricular ejection fraction was normal with grade 1 diastolic dysfunction.  She now presents with progressive exertional fatigue and shortness of breath which has been worsening over the past year.  She said that over the past week she has felt much worse.  She has had some chest tightness with exertion.  She has occasional episodes of dizziness.  She has had bilateral lower extremity edema.  Repeat echocardiogram on 08/30/2022 showed an increase in the aortic valve mean gradient to 30 mmHg with a peak of 51.  Valve area was inaccurate.  Cardiac catheterization showed nonobstructive coronary disease with a mean gradient across aortic valve of 32.5 mmHg with a valve area of 0.84 cm.   She lives alone and is independent taking care of her house.       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   Osteoporosis     Severe aortic stenosis     Vertigo             Past Surgical History:  Procedure Laterality Date   CHOLECYSTECTOMY       COLONOSCOPY   2010 last   ESOPHAGOGASTRODUODENOSCOPY N/A 08/08/2016     Procedure: ESOPHAGOGASTRODUODENOSCOPY (EGD);  Surgeon: Iva Boop, MD;  Location: [facility] ENDOSCOPY;  Service: Endoscopy;  Laterality: N/A;   RIGHT/LEFT HEART CATH AND CORONARY ANGIOGRAPHY N/A 09/21/2022    Procedure: RIGHT/LEFT HEART CATH AND CORONARY ANGIOGRAPHY;  Surgeon: Swaziland, Peter M, MD;  Location: [facility] INVASIVE CV LAB;  Service: Cardiovascular;  Laterality: N/A;   SKIN CANCER EXCISION        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    TUBAL LIGATION               Family History  Problem Relation Age of Onset   Stroke Mother     Cancer Sister     Breast cancer Sister 17   Pneumonia Father     Kidney failure Brother     Aortic stenosis Brother        Social History         Socioeconomic History   Marital status: Widowed      Spouse name: Not on file   Number of children: 3   Years of education: Not on file   Highest education level: Not on file  Occupational History   Occupation: retired Runner, broadcasting/film/video  Tobacco Use   Smoking status: Never   Smokeless tobacco: Never  Substance and Sexual Activity   Alcohol use: No   Drug use: No   Sexual activity: Not on file  Other Topics Concern   Not on file  Social History Narrative   Not on file    Social Determinants of Health        Financial Resource Strain: Not on file  Food Insecurity: No Food Insecurity (12/20/2021)    Hunger Vital Sign     Worried About Running Out of Food in the Last Year: Never true     Ran Out of Food in the Last Year: Never true  Transportation Needs: Not on file  Physical Activity: Not on file  Stress: Not on file  Social Connections: Not on file  Intimate Partner Violence: Not on file             Prior to Admission medications   Medication Sig Start Date End Date Taking? Authorizing Provider  amLODipine (NORVASC) 10 MG tablet Take 10 mg by mouth at bedtime.     Yes [provider]  aspirin EC 81 MG tablet Take 1 tablet (81 mg total) by mouth daily. Swallow  whole. Patient taking differently: Take 81 mg by mouth at bedtime. Swallow whole. 09/13/22   Yes Swaziland, Peter M, MD  Cholecalciferol (VITAMIN D) 125 MCG (5000 UT) CAPS Take 5,000 Units by mouth at bedtime.     Yes [provider]  fexofenadine (ALLEGRA) 180 MG tablet Take 180 mg by mouth daily as needed for allergies.     Yes [provider]  fluticasone (FLONASE) 50 MCG/ACT nasal spray Place 1-2 sprays into both nostrils daily as needed for allergies.     Yes [provider]  levothyroxine (SYNTHROID, LEVOTHROID) 100 MCG tablet Take 100 mcg by mouth daily before breakfast.     Yes [provider]  lisinopril (ZESTRIL) 40 MG tablet Take 1 tablet (40 mg total) by mouth daily. 09/24/22   Yes Orbie Pyo, MD  meclizine (ANTIVERT) 25 MG tablet Take 1 tablet (25 mg total) by mouth 3 (three) times daily as needed for dizziness. 07/16/16   Yes Long, Arlyss Repress, MD  Multiple Vitamins-Minerals (PRESERVISION AREDS 2 PO) Take 1 tablet by mouth in the morning and at bedtime.     Yes [provider]  pantoprazole (PROTONIX) 40 MG tablet Take 1 tablet (40 mg total) by mouth 2 (two) times daily. Patient taking differently: Take 40 mg by mouth daily in the afternoon. 08/11/16   Yes Pearson Grippe, MD            Current Outpatient Medications  Medication Sig Dispense Refill   amLODipine (NORVASC) 10 MG tablet Take 10 mg by mouth at bedtime.       aspirin EC 81 MG tablet Take 1 tablet (81 mg total) by mouth daily. Swallow whole. (Patient taking differently: Take 81 mg by mouth at bedtime. Swallow whole.) 32 tablet 0   Cholecalciferol (VITAMIN D) 125 MCG (5000 UT) CAPS Take 5,000 Units by mouth at bedtime.       fexofenadine (ALLEGRA) 180 MG tablet Take 180 mg by mouth daily as needed for allergies.       fluticasone (FLONASE) 50 MCG/ACT nasal spray Place 1-2 sprays into both nostrils daily as needed for allergies.       levothyroxine (SYNTHROID, LEVOTHROID) 100 MCG tablet  Take 100 mcg by mouth daily before breakfast.       lisinopril (ZESTRIL) 40 MG tablet Take 1 tablet (40 mg total)  by mouth daily. 90 tablet 3   meclizine (ANTIVERT) 25 MG tablet Take 1 tablet (25 mg total) by mouth 3 (three) times daily as needed for dizziness. 30 tablet 0   Multiple Vitamins-Minerals (PRESERVISION AREDS 2 PO) Take 1 tablet by mouth in the morning and at bedtime.       pantoprazole (PROTONIX) 40 MG tablet Take 1 tablet (40 mg total) by mouth 2 (two) times daily. (Patient taking differently: Take 40 mg by mouth daily in the afternoon.) 60 tablet 0    No current facility-administered medications for this visit.           Allergies  Allergen Reactions   Chlorthalidone        Other reaction(s): hyponatremia  caused hospital stay   Demerol [Meperidine] Other (See Comments)      unknown          Review of Systems:               General:                      + decreased appetite, + decreased energy, no weight gain, + weight loss, no fever             Cardiac:                       + chest pain with exertion, no chest pain at rest, +SOB with mild exertion, no resting SOB, no PND, + orthopnea, no palpitations, no arrhythmia, no atrial fibrillation, + LE edema, + dizzy spells, no syncope             Respiratory:                 +  shortness of breath, no home oxygen, no productive cough, no dry cough, no bronchitis, no wheezing, no hemoptysis, no asthma, no pain with inspiration or cough, no sleep apnea, no CPAP at night             GI:                               no difficulty swallowing, + reflux, no frequent heartburn, no hiatal hernia, no abdominal pain, no constipation, no diarrhea, no hematochezia, no hematemesis, no melena             GU:                              no dysuria,  + frequency, no urinary tract infection, no hematuria, no kidney stones, no kidney disease             Vascular:                     no pain suggestive of claudication, no pain in feet, + leg  cramps, no varicose veins, no DVT, no non-healing foot ulcer             Neuro:                         no stroke, no TIA's, no seizures, no headaches, no temporary blindness one eye,  no slurred speech, no peripheral neuropathy, no chronic pain, no instability of gait, no memory/cognitive dysfunction  Musculoskeletal:         + arthritis, no joint swelling, no myalgias, no difficulty walking, normal mobility              Skin:                            no rash, no itching, no skin infections, no pressure sores or ulcerations             Psych:                         no anxiety, no depression, no nervousness, no unusual recent stress             Eyes:                           + blurry vision, + floaters, no recent vision changes, + wears glasses              ENT:                            + hearing loss, no loose or painful teeth, + dentures, last saw dentist Jan 2024             Hematologic:               + easy bruising, no abnormal bleeding, no clotting disorder, no frequent epistaxis             Endocrine:                   + borderline diabetes, does not check CBG's at home                            Physical Exam:               BP (!) 170/70   Pulse 80   Resp 20   Ht 5\' 3"  (1.6 m)   Wt 120 lb (54.4 kg)   SpO2 99% Comment: RA  BMI 21.26 kg/m              General:                      Elderly,  well-appearing             HEENT:                       Unremarkable, NCAT, PERLA, EOMI             Neck:                           no JVD, no bruits, no adenopathy             Chest:                          clear to auscultation, symmetrical breath sounds, no wheezes, no rhonchi              CV:                              RRR, 3/6  systolic murmur RSB, no diastolic murmur             Abdomen:                    soft, non-tender, no masses              Extremities:                 warm, well-perfused, pedal pulses palpable, trace lower extremity edema             Rectal/GU                    Deferred             Neuro:                         Grossly non-focal and symmetrical throughout             Skin:                            Clean and dry, no rashes, no breakdown   Diagnostic Tests:     ECHOCARDIOGRAM REPORT       Patient Name:   Margaret Lucas Date of Exam: 08/30/2022  Medical Rec #:  [id number]       Height:       63.0 in  Accession #:    [id number]      Weight:       120.8 lb  Date of Birth:  1934-11-25      BSA:          1.561 m  Patient Age:    87 years        BP:           164/71 mmHg  Patient Gender: F               HR:           57 bpm.  Exam Location:  [address]   Procedure: 2D Echo, Cardiac Doppler, Color Doppler and Strain Analysis   Indications:    I35.0 Aortic Stenosis    History:        Patient has prior history of Echocardiogram examinations,  most                 recent 05/10/2020. Signs/Symptoms:Murmur; Risk  Factors:Diabetes.    Sonographer:   Clearence Ped RCS  Referring Phys: [id number] JESSE M CLEAVER   IMPRESSIONS     1. Moderate aortic stenosis.   2. Left ventricular ejection fraction, by estimation, is 60 to 65%. The  left ventricle has normal function. The left ventricle has no regional  wall motion abnormalities. Left ventricular diastolic parameters are  consistent with Grade I diastolic  dysfunction (impaired relaxation).   3. Right ventricular systolic function is normal. The right ventricular  size is normal. There is normal pulmonary artery systolic pressure.   4. The mitral valve is normal in structure. Mild mitral valve  regurgitation. No evidence of mitral stenosis.   5. The aortic valve is calcified. There is moderate calcification of the  aortic valve. There is moderate thickening of the aortic valve. Aortic  valve regurgitation is mild. Moderate aortic valve stenosis. Aortic valve  mean gradient measures 30.0 mmHg.  Aortic valve Vmax measures 3.58 m/s.   6. The inferior vena cava is normal in size  with greater than 50%  respiratory variability, suggesting right atrial pressure of 3 mmHg.   Comparison(s): Prior images reviewed side by side. ECHO 01/22 aortic mean  17 and peak gradient 30.6 mmHg.   FINDINGS   Left Ventricle: Left ventricular ejection fraction, by estimation, is 60  to 65%. The left ventricle has normal function. The left ventricle has no  regional wall motion abnormalities. The left ventricular internal cavity  size was normal in size. There is   no left ventricular hypertrophy. Left ventricular diastolic parameters  are consistent with Grade I diastolic dysfunction (impaired relaxation).   Right Ventricle: The right ventricular size is normal. No increase in  right ventricular wall thickness. Right ventricular systolic function is  normal. There is normal pulmonary artery systolic pressure. The tricuspid  regurgitant velocity is 2.07 m/s, and   with an assumed right atrial pressure of 3 mmHg, the estimated right  ventricular systolic pressure is 20.1 mmHg.   Left Atrium: Left atrial size was normal in size.   Right Atrium: Right atrial size was normal in size.   Pericardium: There is no evidence of pericardial effusion.   Mitral Valve: The mitral valve is normal in structure. Mild mitral valve  regurgitation. No evidence of mitral valve stenosis.   Tricuspid Valve: The tricuspid valve is normal in structure. Tricuspid  valve regurgitation is trivial. No evidence of tricuspid stenosis.   Aortic Valve: The aortic valve is calcified. There is moderate  calcification of the aortic valve. There is moderate thickening of the  aortic valve. Aortic valve regurgitation is mild. Aortic regurgitation PHT  measures 1098 msec. Moderate aortic stenosis  is present. Aortic valve mean gradient measures 30.0 mmHg. Aortic valve  peak gradient measures 51.3 mmHg. Aortic valve area, by VTI measures 3.07  cm.   Pulmonic Valve: The pulmonic valve was normal in structure.  Pulmonic valve  regurgitation is not visualized. No evidence of pulmonic stenosis.   Aorta: The aortic root is normal in size and structure.   Venous: The inferior vena cava is normal in size with greater than 50%  respiratory variability, suggesting right atrial pressure of 3 mmHg.   IAS/Shunts: No atrial level shunt detected by color flow Doppler.     LEFT VENTRICLE  PLAX 2D  LVIDd:         3.90 cm   Diastology  LVIDs:         2.00 cm   LV e' medial:    5.55 cm/s  LV PW:         1.00 cm   LV E/e' medial:  11.0  LV IVS:        0.90 cm   LV e' lateral:   5.87 cm/s  LVOT diam:     2.00 cm   LV E/e' lateral: 10.4  LV SV:         95  LV SV Index:   61        2D Longitudinal Strain  LVOT Area:     3.14 cm  2D Strain GLS (A2C):   -23.5 %                           2D Strain GLS (A3C):   -20.3 %                           2D Strain GLS (A4C):   -19.9 %  2D Strain GLS Avg:     -21.2 %   RIGHT VENTRICLE  RV Basal diam:  3.10 cm  RV S prime:     11.20 cm/s  TAPSE (M-mode): 2.3 cm  RVSP:           20.1 mmHg   LEFT ATRIUM             Index        RIGHT ATRIUM           Index  LA diam:        3.20 cm 2.05 cm/m   RA Pressure: 3.00 mmHg  LA Vol (A2C):   40.2 ml 25.76 ml/m  RA Area:     12.80 cm  LA Vol (A4C):   36.6 ml 23.45 ml/m  RA Volume:   33.20 ml  21.27 ml/m  LA Biplane Vol: 41.5 ml 26.59 ml/m   AORTIC VALVE  AV Area (Vmax):    1.07 cm  AV Area (Vmean):   3.16 cm  AV Area (VTI):     3.07 cm  AV Vmax:           358.00 cm/s  AV Vmean:          88.700 cm/s  AV VTI:            0.310 m  AV Peak Grad:      51.3 mmHg  AV Mean Grad:      30.0 mmHg  LVOT Vmax:         122.00 cm/s  LVOT Vmean:        89.100 cm/s  LVOT VTI:          0.302 m  LVOT/AV VTI ratio: 0.98  AI PHT:            1098 msec    AORTA  Ao Root diam: 2.80 cm  Ao Asc diam:  2.90 cm   MITRAL VALVE                TRICUSPID VALVE  MV Area (PHT):              TR Peak grad:   17.1 mmHg   MV Decel Time:              TR Vmax:        207.00 cm/s  MV E velocity: 61.30 cm/s   Estimated RAP:  3.00 mmHg  MV A velocity: 109.00 cm/s  RVSP:           20.1 mmHg  MV E/A ratio:  0.56                              SHUNTS                              Systemic VTI:  0.30 m                              Systemic Diam: 2.00 cm   Donato Schultz MD  Electronically signed by Donato Schultz MD  Signature Date/Time: 08/30/2022/11:54:52 AM        Final        hysicians   Panel Physicians Referring Physician Case Authorizing Physician  Swaziland, Peter M, MD (Primary)        Procedures   RIGHT/LEFT HEART CATH  AND CORONARY ANGIOGRAPHY    Conclusion       Prox RCA lesion is 40% stenosed.   Mid RCA lesion is 35% stenosed.   Prox LAD to Mid LAD lesion is 30% stenosed.   LV end diastolic pressure is normal.   There is severe aortic valve stenosis.   Nonobstructive CAD Normal LV filling pressures. LVEDP 13 mm Hg, PCWP 16/7 with mean 8 mm Hg Normal right heart pressures. PAP 28/3 mean 13 mm Hg Normal cardiac output 4.51 L/min, index 2.91 Severe aortic stenosis. Mean gradient 32.5 mm Hg. AVA 0.84 cm squared with index 0.54   Plan: refer to valvular heart team for consideration of TAVR   Indications   Nonrheumatic aortic valve stenosis [I35.0 (ICD-10-CM)]    Procedural Details   Technical Details Indication: 88 yo WF with symptomatic severe aortic stenosis  Procedural Details: The right wrist was prepped, draped, and anesthetized with 1% lidocaine. Using the modified Seldinger technique a 6 Fr slender sheath was placed in the right radial artery and a 5 French sheath was placed in the right brachial vein. A Swan-Ganz catheter was used for the right heart catheterization. Standard protocol was followed for recording of right heart pressures and sampling of oxygen saturations. Fick cardiac output was calculated. Standard Judkins catheters were used for selective coronary angiography and left  ventricular pressures. There were no immediate procedural complications. The patient was transferred to the post catheterization recovery area for further monitoring.  Contrast: 45 cc Estimated blood loss <50 mL.   During this procedure medications were administered to achieve and maintain moderate conscious sedation while the patient's heart rate, blood pressure, and oxygen saturation were continuously monitored and I was present face-to-face 100% of this time.    Medications (Filter: Administrations occurring from [phone] to 0938 on 09/21/22)  important  Continuous medications are totaled by the amount administered until 09/21/22 0938.    Heparin (Porcine) in NaCl 1000-0.9 UT/500ML-% SOLN (mL)  Total volume: 1,000 mL Date/Time Rate/Dose/Volume Action    09/21/22 0849 500 mL Given    0849 500 mL Given    fentaNYL (SUBLIMAZE) injection (mcg)  Total dose: 25 mcg Date/Time Rate/Dose/Volume Action    09/21/22 0857 25 mcg Given    midazolam (VERSED) injection (mg)  Total dose: 1 mg Date/Time Rate/Dose/Volume Action    09/21/22 0857 1 mg Given    lidocaine (PF) (XYLOCAINE) 1 % injection (mL)  Total volume: 4 mL Date/Time Rate/Dose/Volume Action    09/21/22 0903 2 mL Given    0904 2 mL Given    Radial Cocktail/Verapamil only (mL)  Total volume: 10 mL Date/Time Rate/Dose/Volume Action    09/21/22 0904 10 mL Given    iohexol (OMNIPAQUE) 350 MG/ML injection (mL)  Total volume: 45 mL Date/Time Rate/Dose/Volume Action    09/21/22 0925 45 mL Given    heparin sodium (porcine) injection (Units)  Total dose: 3,000 Units Date/Time Rate/Dose/Volume Action    09/21/22 0919 3,000 Units Given      Sedation Time   Sedation Time Physician-1: 27 minutes 31 seconds Contrast        Administrations occurring from 0846 to 0938 on 09/21/22:  Medication Name Total Dose  iohexol (OMNIPAQUE) 350 MG/ML injection 45 mL    Radiation/Fluoro   Fluoro time: 4.2 (min) DAP: 6573 (mGycm2) Cumulative Air  Kerma: 103 (mGy) Complications   Complications documented before study signed (09/21/2022  9:38 AM)    No complications were associated with this study.  Documented by Claudette Laws,  Rennis Petty, RT - 09/21/2022  9:30 AM      Coronary Findings   Diagnostic Dominance: Right Left Anterior Descending  Prox LAD to Mid LAD lesion is 30% stenosed.    Left Circumflex    First Obtuse Marginal Branch  Vessel is large in size.    Right Coronary Artery  Prox RCA lesion is 40% stenosed.  Mid RCA lesion is 35% stenosed.    Intervention    No interventions have been documented.    Left Heart   Left Ventricle LV end diastolic pressure is normal.  Aortic Valve There is severe aortic valve stenosis.      ADDENDUM REPORT: 10/02/2022 17:44   CLINICAL DATA:  Severe Aortic Stenosis.   EXAM: Cardiac TAVR CT   TECHNIQUE: A non-contrast, gated CT scan was obtained with axial slices of 3 mm through the heart for aortic valve calcium scoring. A 120 kV retrospective, gated, contrast cardiac scan was obtained. Gantry rotation speed was 250 msecs and collimation was 0.6 mm. Nitroglycerin was not given. The 3D data set was reconstructed in 5% intervals of the 0-95% of the R-R cycle. Systolic and diastolic phases were analyzed on a dedicated workstation using MPR, MIP, and VRT modes. The patient received 100 cc of contrast.   FINDINGS: Image quality: Excellent.   Noise artifact is: Limited.   Valve Morphology: Tricuspid aortic valve with diffuse severe thickening and severe calcifications. Restricted leaflet movement in systole.   Aortic Valve Calcium score: 1120   Aortic annular dimension:   Phase assessed: 35%   Annular area: 407 mm2   Annular perimeter: 72.4 mm   Max diameter: 24.9 mm   Min diameter: 20.7 mm   Annular and subannular calcification: None.   Membranous septum length: 9.5 mm   Optimal coplanar projection: LAO 17 CAU 6   Coronary Artery Height above Annulus:   Left  Main: 11.5 mm   Right Coronary: 15.1 mm   Sinus of Valsalva Measurements:   Non-coronary: 28.5 mm   Right-coronary: 26.0 mm   Left-coronary: 27.3 mm   Sinus of Valsalva Height:   Non-coronary: 20.5 mm   Right-coronary: 20.3 mm   Left-coronary: 16.9 mm   Sinotubular Junction: 23 mm   Ascending Thoracic Aorta: 31 mm   Coronary Arteries: Normal coronary origin. Right dominance. The study was performed without use of NTG and is insufficient for plaque evaluation. Please refer to recent cardiac catheterization for coronary assessment.   Cardiac Morphology:   Right Atrium: Right atrial size is within normal limits.   Right Ventricle: The right ventricular cavity is within normal limits.   Left Atrium: Left atrial size is normal in size with no left atrial appendage filling defect.   Left Ventricle: The ventricular cavity size is within normal limits.   Pulmonary arteries: Normal in size without proximal filling defect.   Pulmonary veins: Normal pulmonary venous drainage.   Pericardium: Normal thickness with no significant effusion or calcium present.   Mitral Valve: The mitral valve is normal structure without significant calcification.   Extra-cardiac findings: See attached radiology report for non-cardiac structures.   IMPRESSION: 1. Annular measurements support a 23 mm S3. Perimeter is in between 26/29 mm Evolut Pro. Sinuses does not support a 29 mm device, and are borderline for a 26 mm device.   2. No significant annular or subannular calcifications.   3. Sufficient coronary to annulus distance.   4. Optimal Fluoroscopic Angle for Delivery: LAO 17 CAU 6   [place] T. O'Neal,  MD     Electronically Signed   By: Lennie Odor M.D.   On: 10/02/2022 17:44    Addended by Sande Rives, MD on 10/02/2022  5:46 PM    Study Result   Narrative & Impression  EXAM: OVER-READ INTERPRETATION  CT CHEST   The following report is a limited chest CT  over-read performed by radiologist Dr. Allegra Lai of [facility] on 10/02/2022. This over-read does not include interpretation of cardiac or coronary anatomy or pathology. The cardiac TAVR interpretation by the cardiologist is attached.   COMPARISON:  None Available.   FINDINGS: Extracardiac findings will be described separately under dictation for contemporaneously obtained CTA chest, abdomen and pelvis.   IMPRESSION: Please see separate dictation for contemporaneously obtained CTA chest, abdomen and pelvis dated 10/02/2022 for full description of relevant extracardiac findings.   Electronically Signed: By: Allegra Lai M.D. On: 10/02/2022 12:22          Narrative & Impression  CLINICAL DATA:  Preop evaluation for aortic valve replacement   EXAM: CT ANGIOGRAPHY CHEST, ABDOMEN AND PELVIS   TECHNIQUE: Multidetector CT imaging through the chest, abdomen and pelvis was performed using the standard protocol during bolus administration of intravenous contrast. Multiplanar reconstructed images and MIPs were obtained and reviewed to evaluate the vascular anatomy.   RADIATION DOSE REDUCTION: This exam was performed according to the departmental dose-optimization program which includes automated exposure control, adjustment of the mA and/or kV according to patient size and/or use of iterative reconstruction technique.   CONTRAST:  OMNIPAQUE IOHEXOL 350 MG/ML SOLN   COMPARISON:  Chest CT dated August 07, 2016   FINDINGS: CTA CHEST FINDINGS   Cardiovascular: Normal heart size. No pericardial effusion. Moderate coronary artery calcifications. Normal caliber thoracic aorta with moderate calcified plaque. Mild coronary artery calcifications of the LAD.   Mediastinum/Nodes: Esophagus and thyroid are unremarkable. No enlarged lymph nodes seen in the chest.   Lungs/Pleura: Central airways are patent. New right lower lobe solid nodule measuring 7 mm on  series 7, image 60. No consolidation, pleural effusion or pneumothorax.   Musculoskeletal: No chest wall abnormality. No acute or significant osseous findings.   CTA ABDOMEN AND PELVIS FINDINGS   Hepatobiliary: No focal liver abnormality is seen. Status post cholecystectomy. No biliary dilatation.   Pancreas: Unremarkable. No pancreatic ductal dilatation or surrounding inflammatory changes.   Spleen: Normal in size without focal abnormality.   Adrenals/Urinary Tract: Bilateral adrenal glands are unremarkable. No hydronephrosis or nephrolithiasis. Bilateral low-attenuation renal lesions which are too small to accurately characterize but likely simple cysts, no specific follow-up imaging is necessary. Bladder is unremarkable.   Stomach/Bowel: Stomach is within normal limits. Appendix appears normal. Diverticulosis. No evidence of bowel wall thickening, distention, or inflammatory changes.   Vascular/lymphatic: Normal caliber thoracic aorta with severe atherosclerotic disease. 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 No enlarged lymph nodes seen in the abdomen and pelvis.   Reproductive: Uterus and bilateral adnexa are unremarkable.   Other: No abdominal wall hernia or abnormality. No abdominopelvic ascites.   Musculoskeletal: No acute or significant osseous findings.   VASCULAR MEASUREMENTS PERTINENT TO TAVR:   AORTA:   Minimal Aortic Diameter-9.1 mm   Severity of Aortic Calcification-severe   RIGHT PELVIS:   Right Common Iliac Artery -   Minimal Diameter-6.3 mm   Tortuosity-mild   Calcification-severe   Right External Iliac Artery -   Minimal  Diameter-5.7 mm   Tortuosity-moderate   Calcification-none   Right Common Femoral Artery -   Minimal Diameter-5.9 mm   Tortuosity-none   Calcification-moderate   LEFT PELVIS:   Left Common Iliac Artery  -   Minimal Diameter-5.3 mm   Tortuosity-mild   Calcification-severe   Left External Iliac Artery -   Minimal Diameter-6.2 mm   Tortuosity-mild   Calcification-none   Left Common Femoral Artery -   Minimal Diameter-6.4 mm   Tortuosity-none   Calcification-none   Review of the MIP images confirms the above findings.   IMPRESSION: Vascular:   1. Vascular findings and measurements pertinent to potential TAVR procedure, as detailed above. 2. Thickening and calcification of the aortic valve, compatible with reported clinical history of aortic stenosis. 3. Severe aortoiliac atherosclerosis. 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 4. Mild coronary artery calcifications of the LAD.   Nonvascular:   1. New right lower lobe solid nodule measuring 7 mm. Non-contrast chest CT at 6-12 months is recommended. If the nodule is stable at time of repeat CT, then future CT at 18-24 months (from today's scan) is considered optional for low-risk patients, but is recommended for high-risk patients. This recommendation follows the consensus statement: Guidelines for Management of Incidental Pulmonary Nodules Detected on CT Images: From the Fleischner Society 2017; Radiology 2017; 284:228-243.     Electronically Signed   By: Allegra Lai M.D.   On: 10/02/2022 13:34      Impression:   This 88 year old patient has stage D, severe, symptomatic aortic stenosis with NYHA class II symptoms of exertional fatigue and shortness of breath consistent with chronic diastolic congestive heart failure.  She has recently been having episodes of dizziness and chest discomfort.  I have personally reviewed her 2D echocardiogram, cardiac catheterization, and CTA studies.  Her echocardiogram shows a trileaflet aortic valve with moderate calcification and thickening and restricted leaflet mobility.  The mean gradient is 30 mmHg  with a peak of 51 mmHg.  Her aortic stenosis looks visually severe.  Left ventricular ejection fraction is normal.  Cardiac catheterization showed mild nonobstructive coronary disease.  The mean gradient across the aortic valve was 32.5 mmHg with a valve area of 0.84 cm.  I agree that aortic valve replacement is indicated in this patient for relief of her progressively worsening symptoms and to prevent left ventricular deterioration.  Given her advanced age I think transcatheter aortic valve replacement would be the best option for her.  Her gated cardiac CTA shows anatomy suitable for TAVR using a 23 mm SAPIEN 3 valve.  Her abdominal and pelvic CTA shows adequate pelvic vascular anatomy to allow transfemoral insertion.   The patient and her son were counseled at length regarding treatment alternatives for management of severe symptomatic aortic stenosis. The risks and benefits of surgical intervention has been discussed in detail. Long-term prognosis with medical therapy was discussed. Alternative approaches such as conventional surgical aortic valve replacement, transcatheter aortic valve replacement, and palliative medical therapy were compared and contrasted at length. This discussion was placed in the context of the patient's own specific clinical presentation and past medical history. All of their questions have been addressed.    Following the decision to proceed with transcatheter aortic valve replacement, a discussion was held regarding what types of management strategies would be attempted intraoperatively in the event of life-threatening complications, including whether or not the patient would be considered a candidate for the use of cardiopulmonary bypass  and/or conversion to open sternotomy for attempted surgical intervention.  She is 88 years old but in otherwise good condition and I would consider her to be a candidate for emergent sternotomy to manage any intraoperative complications.  The  patient is aware of the fact that transient use of cardiopulmonary bypass may be necessary. The patient has been advised of a variety of complications that might develop including but not limited to risks of death, stroke, paravalvular leak, aortic dissection or other major vascular complications, aortic annulus rupture, device embolization, cardiac rupture or perforation, mitral regurgitation, acute myocardial infarction, arrhythmia, heart block or bradycardia requiring permanent pacemaker placement, congestive heart failure, respiratory failure, renal failure, pneumonia, infection, other late complications related to structural valve deterioration or migration, or other complications that might ultimately cause a temporary or permanent loss of functional independence or other long term morbidity. The patient provides full informed consent for the procedure as described and all questions were answered.       Plan:   Transfemoral TAVR using a SAPIEN 3 valve.      [name], MD

## 2022-10-22 NOTE — Progress Notes (Signed)
Letter with instructions given to patient as well as CHG soap. CHG soap instructions reviewed and patient questions answered.   

## 2022-10-23 ENCOUNTER — Inpatient Hospital Stay (HOSPITAL_COMMUNITY): Payer: Medicare PPO | Admitting: Vascular Surgery

## 2022-10-23 ENCOUNTER — Encounter (HOSPITAL_COMMUNITY)
Admission: RE | Disposition: A | Discharge status: Home / Self Care | Payer: Self-pay | Source: Home / Self Care | Attending: Internal Medicine

## 2022-10-23 ENCOUNTER — Other Ambulatory Visit: Payer: Self-pay

## 2022-10-23 ENCOUNTER — Inpatient Hospital Stay (HOSPITAL_COMMUNITY): Payer: Medicare PPO | Admitting: Anesthesiology

## 2022-10-23 ENCOUNTER — Inpatient Hospital Stay (HOSPITAL_COMMUNITY): Payer: Medicare PPO

## 2022-10-23 ENCOUNTER — Other Ambulatory Visit: Payer: Self-pay | Admitting: Physician Assistant

## 2022-10-23 ENCOUNTER — Encounter (HOSPITAL_COMMUNITY): Payer: Self-pay | Admitting: Internal Medicine

## 2022-10-23 ENCOUNTER — Inpatient Hospital Stay (HOSPITAL_COMMUNITY)
Admission: RE | Admit: 2022-10-23 | Discharge: 2022-10-24 | DRG: 267 | Disposition: A | Discharge status: Home / Self Care | Payer: Medicare PPO | Attending: Internal Medicine | Admitting: Internal Medicine

## 2022-10-23 DIAGNOSIS — Z79899 Other long term (current) drug therapy: Secondary | ICD-10-CM | POA: Diagnosis not present

## 2022-10-23 DIAGNOSIS — I351 Nonrheumatic aortic (valve) insufficiency: Secondary | ICD-10-CM | POA: Diagnosis not present

## 2022-10-23 DIAGNOSIS — K226 Gastro-esophageal laceration-hemorrhage syndrome: Secondary | ICD-10-CM | POA: Diagnosis present

## 2022-10-23 DIAGNOSIS — I110 Hypertensive heart disease with heart failure: Secondary | ICD-10-CM | POA: Diagnosis present

## 2022-10-23 DIAGNOSIS — Z823 Family history of stroke: Secondary | ICD-10-CM

## 2022-10-23 DIAGNOSIS — Z952 Presence of prosthetic heart valve: Secondary | ICD-10-CM

## 2022-10-23 DIAGNOSIS — I5032 Chronic diastolic (congestive) heart failure: Secondary | ICD-10-CM | POA: Diagnosis present

## 2022-10-23 DIAGNOSIS — Z85828 Personal history of other malignant neoplasm of skin: Secondary | ICD-10-CM

## 2022-10-23 DIAGNOSIS — E119 Type 2 diabetes mellitus without complications: Secondary | ICD-10-CM | POA: Diagnosis not present

## 2022-10-23 DIAGNOSIS — R911 Solitary pulmonary nodule: Secondary | ICD-10-CM | POA: Diagnosis present

## 2022-10-23 DIAGNOSIS — I4510 Unspecified right bundle-branch block: Secondary | ICD-10-CM | POA: Diagnosis present

## 2022-10-23 DIAGNOSIS — Z7982 Long term (current) use of aspirin: Secondary | ICD-10-CM

## 2022-10-23 DIAGNOSIS — E871 Hypo-osmolality and hyponatremia: Secondary | ICD-10-CM | POA: Diagnosis present

## 2022-10-23 DIAGNOSIS — Z79890 Hormone replacement therapy (postmenopausal): Secondary | ICD-10-CM

## 2022-10-23 DIAGNOSIS — I350 Nonrheumatic aortic (valve) stenosis: Principal | ICD-10-CM | POA: Diagnosis present

## 2022-10-23 DIAGNOSIS — M810 Age-related osteoporosis without current pathological fracture: Secondary | ICD-10-CM | POA: Diagnosis present

## 2022-10-23 DIAGNOSIS — Z8719 Personal history of other diseases of the digestive system: Secondary | ICD-10-CM | POA: Diagnosis not present

## 2022-10-23 DIAGNOSIS — I2510 Atherosclerotic heart disease of native coronary artery without angina pectoris: Secondary | ICD-10-CM | POA: Diagnosis present

## 2022-10-23 DIAGNOSIS — Z841 Family history of disorders of kidney and ureter: Secondary | ICD-10-CM | POA: Diagnosis not present

## 2022-10-23 DIAGNOSIS — Z006 Encounter for examination for normal comparison and control in clinical research program: Secondary | ICD-10-CM

## 2022-10-23 DIAGNOSIS — I10 Essential (primary) hypertension: Secondary | ICD-10-CM | POA: Diagnosis not present

## 2022-10-23 DIAGNOSIS — E039 Hypothyroidism, unspecified: Secondary | ICD-10-CM | POA: Diagnosis present

## 2022-10-23 DIAGNOSIS — Z803 Family history of malignant neoplasm of breast: Secondary | ICD-10-CM | POA: Diagnosis not present

## 2022-10-23 DIAGNOSIS — Z1152 Encounter for screening for COVID-19: Secondary | ICD-10-CM

## 2022-10-23 HISTORY — PX: INTRAOPERATIVE TRANSTHORACIC ECHOCARDIOGRAM: SHX6523

## 2022-10-23 HISTORY — DX: Presence of prosthetic heart valve: Z95.2

## 2022-10-23 HISTORY — PX: TRANSCATHETER AORTIC VALVE REPLACEMENT, TRANSFEMORAL: SHX6400

## 2022-10-23 LAB — ECHOCARDIOGRAM LIMITED
AR max vel: 3.44 cm2
AV Area VTI: 3.35 cm2
AV Area mean vel: 3.53 cm2
AV Mean grad: 5 mmHg
AV Peak grad: 9.2 mmHg
Ao pk vel: 1.52 m/s
Calc EF: 74.6 %
S' Lateral: 2.4 cm
Single Plane A2C EF: 73.1 %
Single Plane A4C EF: 74.6 %

## 2022-10-23 LAB — POCT I-STAT, CHEM 8
BUN: 12 mg/dL (ref 8–23)
BUN: 12 mg/dL (ref 8–23)
Calcium, Ion: 1.18 mmol/L (ref 1.15–1.40)
Calcium, Ion: 1.25 mmol/L (ref 1.15–1.40)
Chloride: 100 mmol/L (ref 98–111)
Chloride: 100 mmol/L (ref 98–111)
Creatinine, Ser: 0.7 mg/dL (ref 0.44–1.00)
Creatinine, Ser: 0.7 mg/dL (ref 0.44–1.00)
Glucose, Bld: 144 mg/dL — ABNORMAL HIGH (ref 70–99)
Glucose, Bld: 129 mg/dL — ABNORMAL HIGH (ref 70–99)
HCT: 31 % — ABNORMAL LOW (ref 36.0–46.0)
HCT: 26 % — ABNORMAL LOW (ref 36.0–46.0)
Hemoglobin: 10.5 g/dL — ABNORMAL LOW (ref 12.0–15.0)
Hemoglobin: 8.8 g/dL — ABNORMAL LOW (ref 12.0–15.0)
Potassium: 3.9 mmol/L (ref 3.5–5.1)
Potassium: 4 mmol/L (ref 3.5–5.1)
Sodium: 136 mmol/L (ref 135–145)
Sodium: 136 mmol/L (ref 135–145)
TCO2: 28 mmol/L (ref 22–32)
TCO2: 31 mmol/L (ref 22–32)

## 2022-10-23 LAB — GLUCOSE, CAPILLARY: Glucose-Capillary: 92 mg/dL (ref 70–99)

## 2022-10-23 SURGERY — IMPLANTATION, AORTIC VALVE, TRANSCATHETER, FEMORAL APPROACH
Anesthesia: Monitor Anesthesia Care

## 2022-10-23 MED ORDER — ACETAMINOPHEN 650 MG RE SUPP: 650.0000 mg | Freq: Four times a day (QID) | RECTAL | Status: DC | PRN

## 2022-10-23 MED ORDER — NITROGLYCERIN IN D5W 200-5 MCG/ML-% IV SOLN: 0.0000 ug/min | INTRAVENOUS | Status: DC

## 2022-10-23 MED ORDER — MUPIROCIN 2 % EX OINT
TOPICAL_OINTMENT | CUTANEOUS | Status: AC
  Filled 2022-10-23: qty 22

## 2022-10-23 MED ORDER — LEVOTHYROXINE SODIUM 100 MCG PO TABS
100.0000 ug | ORAL_TABLET | Freq: Every day | ORAL | Status: DC
  Administered 2022-10-23 – 2022-10-24 (×2): 100 ug via ORAL
  Filled 2022-10-23 (×2): qty 1

## 2022-10-23 MED ORDER — OXYCODONE HCL 5 MG PO TABS: 5.0000 mg | ORAL_TABLET | ORAL | Status: DC | PRN

## 2022-10-23 MED ORDER — PROPOFOL 500 MG/50ML IV EMUL
INTRAVENOUS | Status: DC | PRN
  Administered 2022-10-23: 25 ug/kg/min via INTRAVENOUS

## 2022-10-23 MED ORDER — PANTOPRAZOLE SODIUM 40 MG PO TBEC
40.0000 mg | DELAYED_RELEASE_TABLET | Freq: Every day | ORAL | Status: DC
  Administered 2022-10-23: 40 mg via ORAL
  Filled 2022-10-23: qty 1

## 2022-10-23 MED ORDER — PROTAMINE SULFATE 10 MG/ML IV SOLN
INTRAVENOUS | Status: DC | PRN
  Administered 2022-10-23: 70 mg via INTRAVENOUS
  Administered 2022-10-23: 20 mg via INTRAVENOUS

## 2022-10-23 MED ORDER — SODIUM CHLORIDE 0.9 % IV SOLN: 250.0000 mL | INTRAVENOUS | Status: DC | PRN

## 2022-10-23 MED ORDER — LIDOCAINE HCL (PF) 1 % IJ SOLN
INTRAMUSCULAR | Status: DC | PRN
  Administered 2022-10-23 (×2): 10 mL

## 2022-10-23 MED ORDER — GLYCOPYRROLATE PF 0.2 MG/ML IJ SOSY
PREFILLED_SYRINGE | INTRAMUSCULAR | Status: DC | PRN
  Administered 2022-10-23: .2 mg via INTRAVENOUS

## 2022-10-23 MED ORDER — MORPHINE SULFATE (PF) 2 MG/ML IV SOLN: 1.0000 mg | INTRAVENOUS | Status: DC | PRN

## 2022-10-23 MED ORDER — LACTATED RINGERS IV SOLN: INTRAVENOUS | Status: DC

## 2022-10-23 MED ORDER — SODIUM CHLORIDE 0.9 % IV SOLN
INTRAVENOUS | Status: AC
  Administered 2022-10-23: 50 mL/h via INTRAVENOUS

## 2022-10-23 MED ORDER — CHLORHEXIDINE GLUCONATE 4 % EX SOLN: 60.0000 mL | Freq: Once | CUTANEOUS | Status: DC

## 2022-10-23 MED ORDER — IOHEXOL 350 MG/ML SOLN
INTRAVENOUS | Status: DC | PRN
  Administered 2022-10-23: 100 mL

## 2022-10-23 MED ORDER — ACETAMINOPHEN 325 MG PO TABS: 650.0000 mg | ORAL_TABLET | Freq: Four times a day (QID) | ORAL | Status: DC | PRN

## 2022-10-23 MED ORDER — FENTANYL CITRATE (PF) 100 MCG/2ML IJ SOLN
INTRAMUSCULAR | Status: AC
  Filled 2022-10-23: qty 2

## 2022-10-23 MED ORDER — HEPARIN (PORCINE) IN NACL 1000-0.9 UT/500ML-% IV SOLN
INTRAVENOUS | Status: DC | PRN
  Administered 2022-10-23 (×3): 500 mL

## 2022-10-23 MED ORDER — ONDANSETRON HCL 4 MG/2ML IJ SOLN: 4.0000 mg | Freq: Four times a day (QID) | INTRAMUSCULAR | Status: DC | PRN

## 2022-10-23 MED ORDER — ONDANSETRON HCL 4 MG/2ML IJ SOLN
INTRAMUSCULAR | Status: DC | PRN
  Administered 2022-10-23: 4 mg via INTRAVENOUS

## 2022-10-23 MED ORDER — CHLORHEXIDINE GLUCONATE 0.12 % MT SOLN
15.0000 mL | Freq: Once | OROMUCOSAL | Status: AC
  Administered 2022-10-23: 15 mL via OROMUCOSAL
  Filled 2022-10-23: qty 15

## 2022-10-23 MED ORDER — SODIUM CHLORIDE 0.9 % IV SOLN: INTRAVENOUS | Status: DC

## 2022-10-23 MED ORDER — CHLORHEXIDINE GLUCONATE 0.12 % MT SOLN: 15.0000 mL | Freq: Once | OROMUCOSAL | Status: DC

## 2022-10-23 MED ORDER — AMLODIPINE BESYLATE 10 MG PO TABS
10.0000 mg | ORAL_TABLET | Freq: Every day | ORAL | Status: DC
  Administered 2022-10-23: 10 mg via ORAL
  Filled 2022-10-23: qty 1

## 2022-10-23 MED ORDER — ASPIRIN 81 MG PO TBEC
81.0000 mg | DELAYED_RELEASE_TABLET | Freq: Every day | ORAL | Status: DC
  Administered 2022-10-23 – 2022-10-24 (×2): 81 mg via ORAL
  Filled 2022-10-23 (×2): qty 1

## 2022-10-23 MED ORDER — HEPARIN SODIUM (PORCINE) 1000 UNIT/ML IJ SOLN
INTRAMUSCULAR | Status: DC | PRN
  Administered 2022-10-23: 9000 [IU] via INTRAVENOUS

## 2022-10-23 MED ORDER — SODIUM CHLORIDE 0.9% FLUSH: 3.0000 mL | INTRAVENOUS | Status: DC | PRN

## 2022-10-23 MED ORDER — TRAMADOL HCL 50 MG PO TABS: 50.0000 mg | ORAL_TABLET | ORAL | Status: DC | PRN

## 2022-10-23 MED ORDER — LORATADINE 10 MG PO TABS
10.0000 mg | ORAL_TABLET | Freq: Every day | ORAL | Status: DC
  Administered 2022-10-23 – 2022-10-24 (×2): 10 mg via ORAL
  Filled 2022-10-23 (×2): qty 1

## 2022-10-23 MED ORDER — LISINOPRIL 20 MG PO TABS
40.0000 mg | ORAL_TABLET | Freq: Every day | ORAL | Status: DC
  Administered 2022-10-23 – 2022-10-24 (×2): 40 mg via ORAL
  Filled 2022-10-23 (×2): qty 2

## 2022-10-23 MED ORDER — LIDOCAINE HCL (PF) 1 % IJ SOLN
INTRAMUSCULAR | Status: AC
  Filled 2022-10-23: qty 30

## 2022-10-23 MED ORDER — SODIUM CHLORIDE 0.9% FLUSH
3.0000 mL | Freq: Two times a day (BID) | INTRAVENOUS | Status: DC
  Administered 2022-10-24: 3 mL via INTRAVENOUS

## 2022-10-23 MED ORDER — CEFAZOLIN SODIUM-DEXTROSE 2-4 GM/100ML-% IV SOLN
2.0000 g | Freq: Three times a day (TID) | INTRAVENOUS | Status: AC
  Administered 2022-10-23 (×2): 2 g via INTRAVENOUS
  Filled 2022-10-23 (×2): qty 100

## 2022-10-23 MED ORDER — FENTANYL CITRATE (PF) 250 MCG/5ML IJ SOLN
INTRAMUSCULAR | Status: DC | PRN
  Administered 2022-10-23: 50 ug via INTRAVENOUS

## 2022-10-23 MED ORDER — CHLORHEXIDINE GLUCONATE 4 % EX SOLN: 30.0000 mL | CUTANEOUS | Status: DC

## 2022-10-23 SURGICAL SUPPLY — 30 items
BAG SNAP BAND KOVER 36X36 (MISCELLANEOUS) ×2 IMPLANT
CABLE ADAPT PACING TEMP 12FT (ADAPTER) IMPLANT
CATH 23 ULTRA DELIVERY (CATHETERS) IMPLANT
CATH DIAG 6FR PIGTAIL ANGLED (CATHETERS) IMPLANT
CATH INFINITI 5FR ANG PIGTAIL (CATHETERS) IMPLANT
CATH INFINITI 6F AL1 (CATHETERS) IMPLANT
CLOSURE MYNX CONTROL 6F/7F (Vascular Products) IMPLANT
CLOSURE PERCLOSE PROSTYLE (VASCULAR PRODUCTS) IMPLANT
CRIMPER (MISCELLANEOUS) IMPLANT
DEVICE INFLATION ATRION QL2530 (MISCELLANEOUS) IMPLANT
HEMOSTAT KELLY 5.5 SS STRL (MISCELLANEOUS) IMPLANT
KIT HEART LEFT (KITS) ×1 IMPLANT
KIT SAPIAN 3 ULTRA RESILIA 23 (Valve) IMPLANT
PACK CARDIAC CATHETERIZATION (CUSTOM PROCEDURE TRAY) ×1 IMPLANT
SHEATH INTRODUCER SET 20-26 (SHEATH) IMPLANT
SHEATH PINNACLE 6F 10CM (SHEATH) IMPLANT
SHEATH PINNACLE 8F 10CM (SHEATH) IMPLANT
SHEATH PROBE COVER 6X72 (BAG) IMPLANT
SLEEVE REPOSITIONING LENGTH 30 (MISCELLANEOUS) IMPLANT
STOPCOCK MORSE 400PSI 3WAY (MISCELLANEOUS) ×2 IMPLANT
SYR MEDRAD MARK 7 150ML (SYRINGE) IMPLANT
TRANSDUCER W/STOPCOCK (MISCELLANEOUS) ×2 IMPLANT
TUBING CONTRAST HIGH PRESS 48 (TUBING) IMPLANT
WIRE AMPLATZ SS-J .035X180CM (WIRE) IMPLANT
WIRE EMERALD 3MM-J .035X150CM (WIRE) IMPLANT
WIRE EMERALD 3MM-J .035X260CM (WIRE) IMPLANT
WIRE EMERALD ST .035X260CM (WIRE) IMPLANT
WIRE MICRO SET SILHO 5FR 7 (SHEATH) IMPLANT
WIRE PACING TEMP ST TIP 5 (CATHETERS) IMPLANT
WIRE SAFARI SM CURVE 275 (WIRE) IMPLANT

## 2022-10-23 NOTE — Progress Notes (Signed)
  Echocardiogram 2D Echocardiogram has been performed.  Janalyn Harder 10/23/2022, 11:33 AM

## 2022-10-23 NOTE — Discharge Instructions (Signed)

## 2022-10-23 NOTE — Discharge Summary (Incomplete)
[facility]   MULTIDISCIPLINARY HEART VALVE TEAM  Discharge Summary    Patient ID: Margaret Lucas MRN: [id number]; DOB: 1935/03/27  Admit date: 10/23/2022 Discharge date: 10/23/2022  Primary Care Provider: Irena Reichmann, DO  Primary Cardiologist: Peter Swaziland, MD / Dr. Lynnette Caffey & Dr. Laneta Simmers (TAVR)  Discharge Diagnoses    Principal Problem:   S/P TAVR (transcatheter aortic valve replacement) Active Problems:   Hyponatremia   Hypothyroidism, unspecified   Essential hypertension   Mallory-Weiss syndrome   Severe aortic stenosis   Allergies Allergies  Allergen Reactions   Chlorthalidone     Other reaction(s): hyponatremia  caused hospital stay   Demerol [Meperidine] Other (See Comments)    unknown    Diagnostic Studies/Procedures    TAVR OPERATIVE NOTE     Date of Procedure:                10/23/2022   Preoperative Diagnosis:      Severe Aortic Stenosis    Postoperative Diagnosis:    Same    Procedure:        Transcatheter Aortic Valve Replacement - Transfemoral Approach             Edwards Sapien 3 Resilia THV (size 23 mm, model # 9755RLS, serial # [id number] )              Co-Surgeons:                         [name], MD and Alverda Skeans, MD Anesthesiologist:                  Eilene Ghazi, MD   Echocardiographer:                 Pre-operative Echo Findings: Severe aortic stenosis Normal left ventricular systolic function   Post-operative Echo Findings: No paravalvular leak Normal left ventricular systolic function   Left Heart Catheterization Findings: Left ventricular end-diastolic pressure of 9 mmHg   _____________    Echo 10/24/22: completed but pending formal read at the time of discharge   History of Present Illness     Margaret Lucas is a 88 y.o. female with a history of T2DM, RBBB, HTN, hypothyroidism, history of GI bleeding, mild obstructive CAD and moderately severe aortic stenosis who presented to [facility] on 10/23/22 for  planned TAVR.   She has been followed by Dr. Peter Swaziland for aortic stenosis. She recently developed exertional fatigue, shortness of breath, LE edema as well as chest tightness and dizziness. Repeat echocardiogram on 08/30/2022 showed an EF 60%, and moderate AS with a mean grad 30 mmHg, peak grad 51.3 mmHg, DVI 0.98, SVI 61, as well a mild MR & AI. Cardiac catheterization showed nonobstructive coronary disease with a mean gradient across aortic valve of 32.5 mmHg with a valve area of 0.84 cm.   The patient was evaluated by the multidisciplinary valve team and felt to have severe, symptomatic aortic stenosis and to be a suitable candidate for TAVR, which was set up for 10/23/22.  Hospital Course     Consultants: none   Severe AS: s/p successful TAVR with a 23 mm Edwards Sapien 3 Ultra Resilia 23 THV via the TF approach on 10/23/22. Post operative echo completed but pending formal read. Groin sites are stable. ECG with *** and no high grade heart block. Continued on home Asprin 81mg  daily. Plan for discharge home today with close follow up in the  outpatient setting.   HTN: BP well controlled. No changes made  RBBB: will discharge her home with a Zio AT to delay late presenting HAVB.  DMT2: she is diet controlled.   Elevated Raise Score: she has a Raise Score of 3 (RBBB and age) and meets criteria for amyloid screening. Will get labs while admitted and PYP score in the outpatient setting.   Pulmonary nodule: pre TAVR CTs showed "new right lower lobe solid nodule measuring 7 mm. Non-contrast chest CT at 6-12 months is recommended. If the nodule is stable at time of repeat CT, then future CT at 18-24 months (from today's scan) is considered optional for low-risk patients, but is recommended for high-risk patients." This will be discussed as an outpatient.   _____________  Discharge Vitals Blood pressure (!) 129/56, pulse (!) 59, temperature (!) 97.5 F (36.4 C), temperature source Oral, resp. rate  17, height 5\' 3"  (1.6 m), weight 54.4 kg, SpO2 100 %.  Filed Weights   10/23/22 0734  Weight: 54.4 kg    *** physical exam  Labs & Radiologic Studies    CBC Recent Labs    10/22/22 0930  WBC 7.7  HGB 12.1  HCT 37.5  MCV 87.4  PLT 267   Basic Metabolic Panel Recent Labs    16/10/96 0930  NA 135  K 3.7  CL 102  CO2 24  GLUCOSE 172*  BUN 8  CREATININE 0.89  CALCIUM 9.2   Liver Function Tests Recent Labs    10/22/22 0930  AST 27  ALT 19  ALKPHOS 74  BILITOT 0.4  PROT 7.2  ALBUMIN 3.9   No results for input(s): "LIPASE", "AMYLASE" in the last 72 hours. Cardiac Enzymes No results for input(s): "CKTOTAL", "CKMB", "CKMBINDEX", "TROPONINI" in the last 72 hours. BNP Invalid input(s): "POCBNP" D-Dimer No results for input(s): "DDIMER" in the last 72 hours. Hemoglobin A1C No results for input(s): "HGBA1C" in the last 72 hours. Fasting Lipid Panel No results for input(s): "CHOL", "HDL", "LDLCALC", "TRIG", "CHOLHDL", "LDLDIRECT" in the last 72 hours. Thyroid Function Tests No results for input(s): "TSH", "T4TOTAL", "T3FREE", "THYROIDAB" in the last 72 hours.  Invalid input(s): "FREET3" _____________  Structural Heart Procedure  Result Date: 10/23/2022 See surgical note for result.  MM 3D SCREENING MAMMOGRAM BILATERAL BREAST  Result Date: 10/11/2022 CLINICAL DATA:  Screening. EXAM: DIGITAL SCREENING BILATERAL MAMMOGRAM WITH TOMOSYNTHESIS AND CAD TECHNIQUE: Bilateral screening digital craniocaudal and mediolateral oblique mammograms were obtained. Bilateral screening digital breast tomosynthesis was performed. The images were evaluated with computer-aided detection. COMPARISON:  Previous exam(s). ACR Breast Density Category b: There are scattered areas of fibroglandular density. FINDINGS: There are no findings suspicious for malignancy. IMPRESSION: No mammographic evidence of malignancy. A result letter of this screening mammogram will be mailed directly to the  patient. RECOMMENDATION: Screening mammogram in one year. (Code:[id number]-B-01Y) BI-RADS CATEGORY  1: Negative. Electronically Signed   By: Norva Pavlov M.D.   On: 10/11/2022 10:05   CT CORONARY MORPH W/CTA COR W/SCORE W/CA W/CM &/OR WO/CM  Addendum Date: 10/02/2022   ADDENDUM REPORT: 10/02/2022 17:44 CLINICAL DATA:  Severe Aortic Stenosis. EXAM: Cardiac TAVR CT TECHNIQUE: A non-contrast, gated CT scan was obtained with axial slices of 3 mm through the heart for aortic valve calcium scoring. A 120 kV retrospective, gated, contrast cardiac scan was obtained. Gantry rotation speed was 250 msecs and collimation was 0.6 mm. Nitroglycerin was not given. The 3D data set was reconstructed in 5% intervals of the 0-95% of the R-R  cycle. Systolic and diastolic phases were analyzed on a dedicated workstation using MPR, MIP, and VRT modes. The patient received 100 cc of contrast. FINDINGS: Image quality: Excellent. Noise artifact is: Limited. Valve Morphology: Tricuspid aortic valve with diffuse severe thickening and severe calcifications. Restricted leaflet movement in systole. Aortic Valve Calcium score: 1120 Aortic annular dimension: Phase assessed: 35% Annular area: 407 mm2 Annular perimeter: 72.4 mm Max diameter: 24.9 mm Min diameter: 20.7 mm Annular and subannular calcification: None. Membranous septum length: 9.5 mm Optimal coplanar projection: LAO 17 CAU 6 Coronary Artery Height above Annulus: Left Main: 11.5 mm Right Coronary: 15.1 mm Sinus of Valsalva Measurements: Non-coronary: 28.5 mm Right-coronary: 26.0 mm Left-coronary: 27.3 mm Sinus of Valsalva Height: Non-coronary: 20.5 mm Right-coronary: 20.3 mm Left-coronary: 16.9 mm Sinotubular Junction: 23 mm Ascending Thoracic Aorta: 31 mm Coronary Arteries: Normal coronary origin. Right dominance. The study was performed without use of NTG and is insufficient for plaque evaluation. Please refer to recent cardiac catheterization for coronary assessment. Cardiac  Morphology: Right Atrium: Right atrial size is within normal limits. Right Ventricle: The right ventricular cavity is within normal limits. Left Atrium: Left atrial size is normal in size with no left atrial appendage filling defect. Left Ventricle: The ventricular cavity size is within normal limits. Pulmonary arteries: Normal in size without proximal filling defect. Pulmonary veins: Normal pulmonary venous drainage. Pericardium: Normal thickness with no significant effusion or calcium present. Mitral Valve: The mitral valve is normal structure without significant calcification. Extra-cardiac findings: See attached radiology report for non-cardiac structures. IMPRESSION: 1. Annular measurements support a 23 mm S3. Perimeter is in between 26/29 mm Evolut Pro. Sinuses does not support a 29 mm device, and are borderline for a 26 mm device. 2. No significant annular or subannular calcifications. 3. Sufficient coronary to annulus distance. 4. Optimal Fluoroscopic Angle for Delivery: LAO 17 CAU 6 [place] T. Flora Lipps, MD Electronically Signed   By: Lennie Odor M.D.   On: 10/02/2022 17:44   Result Date: 10/02/2022 EXAM: OVER-READ INTERPRETATION  CT CHEST The following report is a limited chest CT over-read performed by radiologist Dr. Allegra Lai of [facility] on 10/02/2022. This over-read does not include interpretation of cardiac or coronary anatomy or pathology. The cardiac TAVR interpretation by the cardiologist is attached. COMPARISON:  None Available. FINDINGS: Extracardiac findings will be described separately under dictation for contemporaneously obtained CTA chest, abdomen and pelvis. IMPRESSION: Please see separate dictation for contemporaneously obtained CTA chest, abdomen and pelvis dated 10/02/2022 for full description of relevant extracardiac findings. Electronically Signed: By: Allegra Lai M.D. On: 10/02/2022 12:22   CT ANGIO ABDOMEN PELVIS  W & WO CONTRAST  Result Date:  10/02/2022 CLINICAL DATA:  Preop evaluation for aortic valve replacement EXAM: CT ANGIOGRAPHY CHEST, ABDOMEN AND PELVIS TECHNIQUE: Multidetector CT imaging through the chest, abdomen and pelvis was performed using the standard protocol during bolus administration of intravenous contrast. Multiplanar reconstructed images and MIPs were obtained and reviewed to evaluate the vascular anatomy. RADIATION DOSE REDUCTION: This exam was performed according to the departmental dose-optimization program which includes automated exposure control, adjustment of the mA and/or kV according to patient size and/or use of iterative reconstruction technique. CONTRAST:  OMNIPAQUE IOHEXOL 350 MG/ML SOLN COMPARISON:  Chest CT dated August 07, 2016 FINDINGS: CTA CHEST FINDINGS Cardiovascular: Normal heart size. No pericardial effusion. Moderate coronary artery calcifications. Normal caliber thoracic aorta with moderate calcified plaque. Mild coronary artery calcifications of the LAD. Mediastinum/Nodes: Esophagus and thyroid are unremarkable. No  enlarged lymph nodes seen in the chest. Lungs/Pleura: Central airways are patent. New right lower lobe solid nodule measuring 7 mm on series 7, image 60. No consolidation, pleural effusion or pneumothorax. Musculoskeletal: No chest wall abnormality. No acute or significant osseous findings. CTA ABDOMEN AND PELVIS FINDINGS Hepatobiliary: No focal liver abnormality is seen. Status post cholecystectomy. No biliary dilatation. Pancreas: Unremarkable. No pancreatic ductal dilatation or surrounding inflammatory changes. Spleen: Normal in size without focal abnormality. Adrenals/Urinary Tract: Bilateral adrenal glands are unremarkable. No hydronephrosis or nephrolithiasis. Bilateral low-attenuation renal lesions which are too small to accurately characterize but likely simple cysts, no specific follow-up imaging is necessary. Bladder is unremarkable. Stomach/Bowel: Stomach is within normal limits.  Appendix appears normal. Diverticulosis. No evidence of bowel wall thickening, distention, or inflammatory changes. Vascular/lymphatic: Normal caliber thoracic aorta with severe atherosclerotic disease. 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 No enlarged lymph nodes seen in the abdomen and pelvis. Reproductive: Uterus and bilateral adnexa are unremarkable. Other: No abdominal wall hernia or abnormality. No abdominopelvic ascites. Musculoskeletal: No acute or significant osseous findings. VASCULAR MEASUREMENTS PERTINENT TO TAVR: AORTA: Minimal Aortic Diameter-9.1 mm Severity of Aortic Calcification-severe RIGHT PELVIS: Right Common Iliac Artery - Minimal Diameter-6.3 mm Tortuosity-mild Calcification-severe Right External Iliac Artery - Minimal Diameter-5.7 mm Tortuosity-moderate Calcification-none Right Common Femoral Artery - Minimal Diameter-5.9 mm Tortuosity-none Calcification-moderate LEFT PELVIS: Left Common Iliac Artery - Minimal Diameter-5.3 mm Tortuosity-mild Calcification-severe Left External Iliac Artery - Minimal Diameter-6.2 mm Tortuosity-mild Calcification-none Left Common Femoral Artery - Minimal Diameter-6.4 mm Tortuosity-none Calcification-none Review of the MIP images confirms the above findings. IMPRESSION: Vascular: 1. Vascular findings and measurements pertinent to potential TAVR procedure, as detailed above. 2. Thickening and calcification of the aortic valve, compatible with reported clinical history of aortic stenosis. 3. Severe aortoiliac atherosclerosis. 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 4. Mild coronary artery calcifications of the LAD. Nonvascular: 1. New right lower lobe solid nodule measuring 7 mm. Non-contrast chest CT at 6-12 months is recommended. If the nodule is stable at time of repeat CT, then future  CT at 18-24 months (from today's scan) is considered optional for low-risk patients, but is recommended for high-risk patients. This recommendation follows the consensus statement: Guidelines for Management of Incidental Pulmonary Nodules Detected on CT Images: From the Fleischner Society 2017; Radiology 2017; 284:228-243. Electronically Signed   By: Allegra Lai M.D.   On: 10/02/2022 13:34   CT ANGIO CHEST AORTA W/CM & OR WO/CM  Result Date: 10/02/2022 CLINICAL DATA:  Preop evaluation for aortic valve replacement EXAM: CT ANGIOGRAPHY CHEST, ABDOMEN AND PELVIS TECHNIQUE: Multidetector CT imaging through the chest, abdomen and pelvis was performed using the standard protocol during bolus administration of intravenous contrast. Multiplanar reconstructed images and MIPs were obtained and reviewed to evaluate the vascular anatomy. RADIATION DOSE REDUCTION: This exam was performed according to the departmental dose-optimization program which includes automated exposure control, adjustment of the mA and/or kV according to patient size and/or use of iterative reconstruction technique. CONTRAST:  OMNIPAQUE IOHEXOL 350 MG/ML SOLN COMPARISON:  Chest CT dated August 07, 2016 FINDINGS: CTA CHEST FINDINGS Cardiovascular: Normal heart size. No pericardial effusion. Moderate coronary artery calcifications. Normal caliber thoracic aorta with moderate calcified plaque. Mild coronary artery calcifications of the LAD. Mediastinum/Nodes: Esophagus and thyroid are unremarkable. No enlarged lymph nodes seen in the chest. Lungs/Pleura: Central airways are patent. New right lower lobe  solid nodule measuring 7 mm on series 7, image 60. No consolidation, pleural effusion or pneumothorax. Musculoskeletal: No chest wall abnormality. No acute or significant osseous findings. CTA ABDOMEN AND PELVIS FINDINGS Hepatobiliary: No focal liver abnormality is seen. Status post cholecystectomy. No biliary dilatation. Pancreas: Unremarkable.  No pancreatic ductal dilatation or surrounding inflammatory changes. Spleen: Normal in size without focal abnormality. Adrenals/Urinary Tract: Bilateral adrenal glands are unremarkable. No hydronephrosis or nephrolithiasis. Bilateral low-attenuation renal lesions which are too small to accurately characterize but likely simple cysts, no specific follow-up imaging is necessary. Bladder is unremarkable. Stomach/Bowel: Stomach is within normal limits. Appendix appears normal. Diverticulosis. No evidence of bowel wall thickening, distention, or inflammatory changes. Vascular/lymphatic: Normal caliber thoracic aorta with severe atherosclerotic disease. 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 No enlarged lymph nodes seen in the abdomen and pelvis. Reproductive: Uterus and bilateral adnexa are unremarkable. Other: No abdominal wall hernia or abnormality. No abdominopelvic ascites. Musculoskeletal: No acute or significant osseous findings. VASCULAR MEASUREMENTS PERTINENT TO TAVR: AORTA: Minimal Aortic Diameter-9.1 mm Severity of Aortic Calcification-severe RIGHT PELVIS: Right Common Iliac Artery - Minimal Diameter-6.3 mm Tortuosity-mild Calcification-severe Right External Iliac Artery - Minimal Diameter-5.7 mm Tortuosity-moderate Calcification-none Right Common Femoral Artery - Minimal Diameter-5.9 mm Tortuosity-none Calcification-moderate LEFT PELVIS: Left Common Iliac Artery - Minimal Diameter-5.3 mm Tortuosity-mild Calcification-severe Left External Iliac Artery - Minimal Diameter-6.2 mm Tortuosity-mild Calcification-none Left Common Femoral Artery - Minimal Diameter-6.4 mm Tortuosity-none Calcification-none Review of the MIP images confirms the above findings. IMPRESSION: Vascular: 1. Vascular findings and measurements pertinent to potential TAVR procedure, as detailed above. 2. Thickening and calcification of the  aortic valve, compatible with reported clinical history of aortic stenosis. 3. Severe aortoiliac atherosclerosis. 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 4. Mild coronary artery calcifications of the LAD. Nonvascular: 1. New right lower lobe solid nodule measuring 7 mm. Non-contrast chest CT at 6-12 months is recommended. If the nodule is stable at time of repeat CT, then future CT at 18-24 months (from today's scan) is considered optional for low-risk patients, but is recommended for high-risk patients. This recommendation follows the consensus statement: Guidelines for Management of Incidental Pulmonary Nodules Detected on CT Images: From the Fleischner Society 2017; Radiology 2017; 284:228-243. Electronically Signed   By: Allegra Lai M.D.   On: 10/02/2022 13:34   Disposition   Pt is being discharged home today in good condition.  Follow-up Plans & Appointments     Follow-up Information     Janetta Hora, PA-C. Go on 10/31/2022.   Specialties: Cardiology, Radiology Why: @ 2:20pm, please arrive at least 10 minutes early Contact information: [address] [place] [address] [phone]                  Discharge Medications   Allergies as of 10/23/2022       Reactions   Chlorthalidone    Other reaction(s): hyponatremia  caused hospital stay   Demerol [meperidine] Other (See Comments)   unknown     Med Rec must be completed prior to using this SMARTLINK***           Outstanding Labs/Studies   ***  Duration of Discharge Encounter   Greater than 30 minutes including physician time.  SignedCline Crock, PA-C 10/23/2022, 2:17 PM [phone]

## 2022-10-23 NOTE — Anesthesia Preprocedure Evaluation (Signed)
Anesthesia Evaluation  Patient identified by MRN, date of birth, ID band Patient awake    Reviewed: Allergy & Precautions, H&P , NPO status , Patient's Chart, lab work & pertinent test results  Airway Mallampati: II  TM Distance: >3 FB Neck ROM: Full    Dental no notable dental hx.    Pulmonary neg pulmonary ROS   Pulmonary exam normal breath sounds clear to auscultation       Cardiovascular hypertension, Pt. on medications + Valvular Problems/Murmurs AS  Rhythm:Regular Rate:Normal + Systolic murmurs Left Ventricle: Left ventricular ejection fraction, by estimation, is 60  to 65%. The left ventricle has normal function. The left ventricle has no  regional wall motion abnormalities. The left ventricular internal cavity  size was normal in size. There is   no left ventricular hypertrophy. Left ventricular diastolic parameters  are consistent with Grade I diastolic dysfunction (impaired relaxation).   Right Ventricle: The right ventricular size is normal. No increase in  right ventricular wall thickness. Right ventricular systolic function is  normal. There is normal pulmonary artery systolic pressure. The tricuspid  regurgitant velocity is 2.07 m/s, and   with an assumed right atrial pressure of 3 mmHg, the estimated right  ventricular systolic pressure is 20.1 mmHg.   Left Atrium: Left atrial size was normal in size.   Right Atrium: Right atrial size was normal in size.   Pericardium: There is no evidence of pericardial effusion.   Mitral Valve: The mitral valve is normal in structure. Mild mitral valve  regurgitation. No evidence of mitral valve stenosis.   Tricuspid Valve: The tricuspid valve is normal in structure. Tricuspid  valve regurgitation is trivial. No evidence of tricuspid stenosis.   Aortic Valve: The aortic valve is calcified. There is moderate  calcification of the aortic valve. There is moderate thickening  of the  aortic valve. Aortic valve regurgitation is mild. Aortic regurgitation PHT  measures 1098 msec. Moderate aortic stenosis  is present. Aortic valve mean gradient measures 30.0 mmHg. Aortic valve  peak gradient measures 51.3 mmHg. Aortic valve area, by VTI measures 3.07  cm.     Neuro/Psych negative neurological ROS  negative psych ROS   GI/Hepatic negative GI ROS, Neg liver ROS,,,  Endo/Other  diabetesHypothyroidism    Renal/GU negative Renal ROS  negative genitourinary   Musculoskeletal negative musculoskeletal ROS (+)    Abdominal   Peds negative pediatric ROS (+)  Hematology negative hematology ROS (+)   Anesthesia Other Findings   Reproductive/Obstetrics negative OB ROS                             Anesthesia Physical Anesthesia Plan  ASA: 3  Anesthesia Plan: MAC   Post-op Pain Management: Minimal or no pain anticipated   Induction: Intravenous  PONV Risk Score and Plan: 2 and Propofol infusion, Ondansetron and Treatment may vary due to age or medical condition  Airway Management Planned: Simple Face Mask  Additional Equipment:   Intra-op Plan:   Post-operative Plan:   Informed Consent: I have reviewed the patients History and Physical, chart, labs and discussed the procedure including the risks, benefits and alternatives for the proposed anesthesia with the patient or authorized representative who has indicated his/her understanding and acceptance.     Dental advisory given  Plan Discussed with: CRNA and Surgeon  Anesthesia Plan Comments:        Anesthesia Quick Evaluation

## 2022-10-23 NOTE — Interval H&P Note (Signed)
History and Physical Interval Note:  10/23/2022 9:18 AM  Margaret Lucas  has presented today for surgery, with the diagnosis of Severe Aortic Stenosis.  The various methods of treatment have been discussed with the patient and family. After consideration of risks, benefits and other options for treatment, the patient has consented to  Procedure(s): Transcatheter Aortic Valve Replacement, Transfemoral (N/A) INTRAOPERATIVE TRANSTHORACIC ECHOCARDIOGRAM (N/A) as a surgical intervention.  The patient's history has been reviewed, patient examined, no change in status, stable for surgery.  I have reviewed the patient's chart and labs.  Questions were answered to the patient's satisfaction.     [name]

## 2022-10-23 NOTE — Op Note (Signed)
[facility]   MULTIDISCIPLINARY HEART VALVE TEAM   TAVR OPERATIVE NOTE   Date of Procedure:  10/23/2022  Preoperative Diagnosis: Severe Aortic Stenosis   Postoperative Diagnosis: Same   Procedure:   Transcatheter Aortic Valve Replacement - Percutaneous Right Transfemoral Approach  Edwards Sapien 3 Ultra Resilia THV (size 23 mm, model # 9755RSL, serial # [id number])   Co-Surgeons:  [name], MD and Alverda Skeans, MD   Anesthesiologist:  G. Okey Dupre, MD  Echocardiographer:  Judie Petit. Croitoru, MD  Pre-operative Echo Findings: Severe aortic stenosis Normal left ventricular systolic function  Post-operative Echo Findings: No paravalvular leak Normal left ventricular systolic function   BRIEF CLINICAL NOTE AND INDICATIONS FOR SURGERY  This 88 year old patient has stage D, severe, symptomatic aortic stenosis with NYHA class II symptoms of exertional fatigue and shortness of breath consistent with chronic diastolic congestive heart failure.  She has recently been having episodes of dizziness and chest discomfort.  I have personally reviewed her 2D echocardiogram, cardiac catheterization, and CTA studies.  Her echocardiogram shows a trileaflet aortic valve with moderate calcification and thickening and restricted leaflet mobility.  The mean gradient is 30 mmHg with a peak of 51 mmHg.  Her aortic stenosis looks visually severe.  Left ventricular ejection fraction is normal.  Cardiac catheterization showed mild nonobstructive coronary disease.  The mean gradient across the aortic valve was 32.5 mmHg with a valve area of 0.84 cm.  I agree that aortic valve replacement is indicated in this patient for relief of her progressively worsening symptoms and to prevent left ventricular deterioration.  Given her advanced age I think transcatheter aortic valve replacement would be the best option for her.  Her gated cardiac CTA shows anatomy suitable for TAVR using a 23 mm SAPIEN 3 valve.   Her abdominal and pelvic CTA shows adequate pelvic vascular anatomy to allow transfemoral insertion.   The patient and her son were counseled at length regarding treatment alternatives for management of severe symptomatic aortic stenosis. The risks and benefits of surgical intervention has been discussed in detail. Long-term prognosis with medical therapy was discussed. Alternative approaches such as conventional surgical aortic valve replacement, transcatheter aortic valve replacement, and palliative medical therapy were compared and contrasted at length. This discussion was placed in the context of the patient's own specific clinical presentation and past medical history. All of their questions have been addressed.    Following the decision to proceed with transcatheter aortic valve replacement, a discussion was held regarding what types of management strategies would be attempted intraoperatively in the event of life-threatening complications, including whether or not the patient would be considered a candidate for the use of cardiopulmonary bypass and/or conversion to open sternotomy for attempted surgical intervention.  She is 88 years old but in otherwise good condition and I would consider her to be a candidate for emergent sternotomy to manage any intraoperative complications.  The patient is aware of the fact that transient use of cardiopulmonary bypass may be necessary. The patient has been advised of a variety of complications that might develop including but not limited to risks of death, stroke, paravalvular leak, aortic dissection or other major vascular complications, aortic annulus rupture, device embolization, cardiac rupture or perforation, mitral regurgitation, acute myocardial infarction, arrhythmia, heart block or bradycardia requiring permanent pacemaker placement, congestive heart failure, respiratory failure, renal failure, pneumonia, infection, other late complications related to  structural valve deterioration or migration, or other complications that might ultimately cause a temporary or permanent loss  of functional independence or other long term morbidity. The patient provides full informed consent for the procedure as described and all questions were answered.       DETAILS OF THE OPERATIVE PROCEDURE  PREPARATION:    The patient was brought to the operating room on the above mentioned date and appropriate monitoring was established by the anesthesia team. The patient was placed in the supine position on the operating table.  Intravenous antibiotics were administered. The patient was monitored closely throughout the procedure under conscious sedation.    Baseline transthoracic echocardiogram was performed. The patient's abdomen and both groins were prepped and draped in a sterile manner. A time out procedure was performed.   PERIPHERAL ACCESS:    A 7 Fr sheath was placed in the right internal jugular vein using Seldinger technique and US guidance. A temporary transvenous pacer was advanced through this into the RV and pacing threshold tested and was satisfactory.  Using the modified Seldinger technique, femoral arterial and venous access was obtained with placement of 6 Fr sheaths on the left side.  A pigtail diagnostic catheter was passed through the left arterial sheath under fluoroscopic guidance into the aortic root.  Aortic root angiography was performed in order to determine the optimal angiographic angle for valve deployment.   TRANSFEMORAL ACCESS:   Percutaneous transfemoral access and sheath placement was performed using ultrasound guidance.  The right common femoral artery was cannulated using a micropuncture needle and appropriate location was verified using hand injection angiogram.  A pair of Abbott Perclose percutaneous closure devices were placed and a 6 French sheath replaced into the femoral artery.  The patient was heparinized systemically and ACT  verified > 250 seconds.    A 14 Fr transfemoral E-sheath was introduced into the right common femoral artery after progressively dilating over an Amplatz superstiff wire. An AL-1 catheter was used to direct a straight-tip exchange length wire across the native aortic valve into the left ventricle. This was exchanged out for a pigtail catheter and position was confirmed in the LV apex. Simultaneous LV and Ao pressures were recorded.  The pigtail catheter was exchanged for a Safari wire in the LV apex.   BALLOON AORTIC VALVULOPLASTY:   Not performed    TRANSCATHETER HEART VALVE DEPLOYMENT:   An Edwards Sapien 3 Ultra transcatheter heart valve (size 23 mm) was prepared and crimped per manufacturer's guidelines, and the proper orientation of the valve is confirmed on the [employer] delivery system. The valve was advanced through the introducer sheath using normal technique until in an appropriate position in the abdominal aorta beyond the sheath tip. The balloon was then retracted and using the fine-tuning wheel was centered on the valve. The valve was then advanced across the aortic arch using appropriate flexion of the catheter. The valve was carefully positioned across the aortic valve annulus. The Commander catheter was retracted using normal technique. Once final position of the valve has been confirmed by angiographic assessment, the valve is deployed during rapid ventricular pacing to maintain systolic blood pressure < 50 mmHg and pulse pressure < 10 mmHg. The balloon inflation is held for >3 seconds after reaching full deployment volume. Once the balloon has fully deflated the balloon is retracted into the ascending aorta and valve function is assessed using echocardiography. There is felt to be no paravalvular leak and no central aortic insufficiency.  The patient's hemodynamic recovery following valve deployment is good.  The deployment balloon and guidewire are both removed.  PROCEDURE  COMPLETION:   The sheath was removed and femoral artery closure performed.  Protamine was administered once femoral arterial repair was complete. The temporary pacemaker, pigtail catheter and femoral and internal jugular sheaths were removed with manual pressure used for venous hemostasis.  A Mynx femoral closure device was utilized following removal of the diagnostic sheath in the left femoral artery.  The patient tolerated the procedure well and is transported to the cath lab recovery area in stable condition. There were no immediate intraoperative complications. All sponge instrument and needle counts are verified correct at completion of the operation.   No blood products were administered during the operation.  The patient received a total of 100 mL of intravenous contrast during the procedure.   [name], MD 10/23/2022 3:29 PM

## 2022-10-23 NOTE — Transfer of Care (Signed)
Immediate Anesthesia Transfer of Care Note  Patient: Margaret Lucas  Procedure(s) Performed: Transcatheter Aortic Valve Replacement, Transfemoral INTRAOPERATIVE TRANSTHORACIC ECHOCARDIOGRAM  Patient Location: Cath Lab  Anesthesia Type:MAC  Level of Consciousness: drowsy and patient cooperative  Airway & Oxygen Therapy: Patient Spontanous Breathing and Patient connected to face mask oxygen  Post-op Assessment: Report given to RN and Post -op Vital signs reviewed and stable  Post vital signs: Reviewed and stable  Last Vitals:  Vitals Value Taken Time  BP 136/74 10/23/22 1240  Temp 36.6 C 10/23/22 1230  Pulse 57 10/23/22 1244  Resp 17 10/23/22 1244  SpO2 98 % 10/23/22 1244  Vitals shown include unvalidated device data.  Last Pain:  Vitals:   10/23/22 1200  TempSrc:   PainSc: 0-No pain         Complications: There were no known notable events for this encounter.

## 2022-10-23 NOTE — Anesthesia Postprocedure Evaluation (Signed)
Anesthesia Post Note  Patient: Margaret Lucas  Procedure(s) Performed: Transcatheter Aortic Valve Replacement, Transfemoral INTRAOPERATIVE TRANSTHORACIC ECHOCARDIOGRAM     Patient location during evaluation: PACU Anesthesia Type: MAC Level of consciousness: awake and alert Pain management: pain level controlled Vital Signs Assessment: post-procedure vital signs reviewed and stable Respiratory status: spontaneous breathing, nonlabored ventilation, respiratory function stable and patient connected to nasal cannula oxygen Cardiovascular status: stable and blood pressure returned to baseline Postop Assessment: no apparent nausea or vomiting Anesthetic complications: no  There were no known notable events for this encounter.  Last Vitals:  Vitals:   10/23/22 1200 10/23/22 1215  BP: (!) 134/59 (!) 141/62  Pulse: 65 (!) 58  Resp: 14 12  Temp: 36.5 C   SpO2: 100% 100%    Last Pain:  Vitals:   10/23/22 1200  TempSrc:   PainSc: 0-No pain                 [name] S

## 2022-10-23 NOTE — Progress Notes (Signed)
  [facility]   MULTIDISCIPLINARY HEART VALVE TEAM  Patient doing well s/p TAVR. She is hemodynamically stable. Groin sites stable. ECG not in EPIC yet. Tele looks stable. Arterial line discontinued and transferred to 4E. Plan for early ambulation after bedrest completed and hopeful discharge over the next 24-48 hours.   Cline Crock PA-C  [facility]  Pager [phone]

## 2022-10-23 NOTE — Progress Notes (Signed)
Mobility Specialist Progress Note:   10/23/22 1711  Mobility  Activity Ambulated with assistance in hallway  Level of Assistance Contact guard assist, steadying assist  Assistive Device  (IV Pole)  Distance Ambulated (ft) 45 ft  Activity Response Tolerated well  Mobility Referral Yes  $Mobility charge 1 Mobility  Mobility Specialist Start Time (ACUTE ONLY) 1635  Mobility Specialist Stop Time (ACUTE ONLY) 1650  Mobility Specialist Time Calculation (min) (ACUTE ONLY) 15 min    Pre Mobility: 147/70 BP   Pt received in bed, agreeable to mobility. Requesting to use BR before ambulation. C/o feeling weak d/t first time being OOB today. Slight LOB when standing. D/t fatigue gait distance was shortened. Pt returned to bed with call bell in hand and all needs met. Family present.   Leory Plowman  Mobility Specialist Please contact via [employer] office at [phone]

## 2022-10-23 NOTE — Op Note (Signed)
[facility]  TAVR OPERATIVE NOTE   Date of Procedure:  10/23/2022  Preoperative Diagnosis: Severe Aortic Stenosis   Postoperative Diagnosis: Same   Procedure:   Transcatheter Aortic Valve Replacement - Transfemoral Approach  Edwards Sapien 3 Resilia THV (size 23 mm, model # 9755RLS, serial # [id number] )   Co-Surgeons:   [name], MD and Alverda Skeans, MD Anesthesiologist:  Eilene Ghazi, MD  Echocardiographer:    Pre-operative Echo Findings: Severe aortic stenosis Normal left ventricular systolic function  Post-operative Echo Findings: No paravalvular leak Normal left ventricular systolic function  Left Heart Catheterization Findings: Left ventricular end-diastolic pressure of 9 mmHg   BRIEF CLINICAL NOTE AND INDICATIONS FOR SURGERY  The patient is an 88 year old female with a history of type 2 diabetes, right bundle branch block, hypertension, hypothyroidism, and moderate to severe symptomatic aortic stenosis who is referred for elective transcatheter aortic valve replacement with a 23 mm SAPIEN 3 ultra valve from the right transfemoral approach.  During the course of the patient's preoperative work up they have been evaluated comprehensively by a multidisciplinary team of specialists coordinated through the Multidisciplinary Heart Valve Clinic in the [facility].  They have been demonstrated to suffer from symptomatic severe aortic stenosis as noted above. The patient has been counseled extensively as to the relative risks and benefits of all options for the treatment of severe aortic stenosis including long term medical therapy, conventional surgery for aortic valve replacement, and transcatheter aortic valve replacement.  The patient has been independently evaluated by Dr. Laneta Simmers with CT surgery and they are felt to be at high risk for conventional surgical aortic valve replacement. The surgeon indicated the patient would be a poor  candidate for conventional surgery. Based upon review of all of the patient's preoperative diagnostic tests they are felt to be candidate for transcatheter aortic valve replacement using the transfemoral approach as an alternative to high risk conventional surgery.    Following the decision to proceed with transcatheter aortic valve replacement, a discussion has been held regarding what types of management strategies would be attempted intraoperatively in the event of life-threatening complications, including whether or not the patient would be considered a candidate for the use of cardiopulmonary bypass and/or conversion to open sternotomy for attempted surgical intervention.  The patient has been advised of a variety of complications that might develop peculiar to this approach including but not limited to risks of death, stroke, paravalvular leak, aortic dissection or other major vascular complications, aortic annulus rupture, device embolization, cardiac rupture or perforation, acute myocardial infarction, arrhythmia, heart block or bradycardia requiring permanent pacemaker placement, congestive heart failure, respiratory failure, renal failure, pneumonia, infection, other late complications related to structural valve deterioration or migration, or other complications that might ultimately cause a temporary or permanent loss of functional independence or other long term morbidity.  The patient provides full informed consent for the procedure as described and all questions were answered preoperatively.    DETAILS OF THE OPERATIVE PROCEDURE  PREPARATION:   The patient is brought to the operating room on the above mentioned date and central monitoring was established by the anesthesia team including placement of a radial arterial line. The patient is placed in the supine position on the operating table.  Intravenous antibiotics are administered. Conscious sedation is used.   Baseline transthoracic  echocardiogram was performed. The patient's chest, abdomen, both groins, and both lower extremities are prepared and draped in a sterile manner. A time  out procedure is performed.   PERIPHERAL ACCESS:   Using the modified Seldinger technique, femoral arterial and venous access were obtained with placement of a 6 Fr sheath in the left femoral artery and a 7 Fr sheath in the right internal jugular vein using u/s guidance.  A pigtail diagnostic catheter was passed through the femoral arterial sheath under fluoroscopic guidance into the aortic root.  A temporary transvenous pacemaker catheter was passed through the femoral venous sheath under fluoroscopic guidance into the right ventricle.  The pacemaker was tested to ensure stable lead placement and pacemaker capture. Aortic root angiography was performed in order to determine the optimal angiographic angle for valve deployment.  TRANSFEMORAL ACCESS:  A micropuncture kit was used to gain access to the right femoral artery using u/s guidance. Position confirmed with angiography. Pre-closure with double ProGlide closure devices. The patient was heparinized systemically and ACT verified > 250 seconds.    A 14 Fr transfemoral E-sheath was introduced into the right femoral artery after progressively dilating over an Amplatz superstiff wire. An AL-1 catheter was used to direct a straight-tip exchange length wire across the native aortic valve into the left ventricle. This was exchanged out for a pigtail catheter and position was confirmed in the LV apex. Simultaneous left ventricular, aortic, and left ventricular end-diastolic pressures were recorded.  The pigtail catheter was then exchanged for an Safari wire in the LV apex.  Direct LV pacing thresholds were assessed and found to be adequate.   TRANSCATHETER HEART VALVE DEPLOYMENT:  An Edwards Sapien 3 THV (size 23 mm) was prepared and crimped per manufacturer's guidelines, and the proper orientation of the  valve is confirmed on the [employer] delivery system. The valve was advanced through the introducer sheath using normal technique until in an appropriate position in the abdominal aorta beyond the sheath tip. The balloon was then retracted and using the fine-tuning wheel was centered on the valve. The valve was then advanced across the aortic arch using appropriate flexion of the catheter. The valve was carefully positioned across the aortic valve annulus. The Commander catheter was retracted using normal technique. Once final position of the valve has been confirmed by angiographic assessment, the valve is deployed while temporarily holding ventilation and during rapid ventricular pacing to maintain systolic blood pressure < 50 mmHg and pulse pressure < 10 mmHg. The balloon inflation is held for >3 seconds after reaching full deployment volume. Once the balloon has fully deflated the balloon is retracted into the ascending aorta and valve function is assessed using TTE. There is felt to be no paravalvular leak and no central aortic insufficiency.  The patient's hemodynamic recovery following valve deployment is good.  The deployment balloon and guidewire are both removed. Echo demostrated acceptable post-procedural gradients, stable mitral valve function, and no AI.   PROCEDURE COMPLETION:  The sheath was then removed and closure devices were completed. Protamine was administered once femoral arterial repair was complete. The temporary pacemaker, pigtail catheters and femoral sheaths were removed with a Mynx closure device placed in the artery and manual pressure used for venous hemostasis.    The patient tolerated the procedure well and is transported to the surgical intensive care in stable condition. There were no immediate intraoperative complications. All sponge instrument and needle counts are verified correct at completion of the operation.   No blood products were administered during the  operation.  The patient received a total of 100 mL of intravenous contrast during the procedure.  Consuella Lose  Dot Lanes MD 10/23/2022 12:25 PM

## 2022-10-24 ENCOUNTER — Inpatient Hospital Stay (HOSPITAL_BASED_OUTPATIENT_CLINIC_OR_DEPARTMENT_OTHER)
Admit: 2022-10-24 | Discharge: 2022-10-24 | Disposition: A | Discharge status: Home / Self Care | Payer: Medicare PPO | Attending: Physician Assistant | Admitting: Physician Assistant

## 2022-10-24 ENCOUNTER — Ambulatory Visit: Payer: Medicare PPO | Admitting: General Practice

## 2022-10-24 ENCOUNTER — Inpatient Hospital Stay (HOSPITAL_COMMUNITY): Payer: Medicare PPO

## 2022-10-24 ENCOUNTER — Encounter (HOSPITAL_COMMUNITY): Payer: Self-pay | Admitting: Internal Medicine

## 2022-10-24 DIAGNOSIS — Z1152 Encounter for screening for COVID-19: Secondary | ICD-10-CM | POA: Diagnosis not present

## 2022-10-24 DIAGNOSIS — I4510 Unspecified right bundle-branch block: Secondary | ICD-10-CM

## 2022-10-24 DIAGNOSIS — E871 Hypo-osmolality and hyponatremia: Secondary | ICD-10-CM | POA: Diagnosis not present

## 2022-10-24 DIAGNOSIS — Z952 Presence of prosthetic heart valve: Secondary | ICD-10-CM

## 2022-10-24 DIAGNOSIS — Z006 Encounter for examination for normal comparison and control in clinical research program: Secondary | ICD-10-CM | POA: Diagnosis not present

## 2022-10-24 DIAGNOSIS — I350 Nonrheumatic aortic (valve) stenosis: Secondary | ICD-10-CM | POA: Diagnosis not present

## 2022-10-24 LAB — KAPPA/LAMBDA LIGHT CHAINS
Kappa free light chain: 17 mg/L (ref 3.3–19.4)
Kappa, lambda light chain ratio: 1.01 (ref 0.26–1.65)
Lambda free light chains: 16.9 mg/L (ref 5.7–26.3)

## 2022-10-24 LAB — ECHOCARDIOGRAM COMPLETE
AR max vel: 1.82 cm2
AV Area VTI: 1.99 cm2
AV Area mean vel: 1.91 cm2
AV Mean grad: 11 mmHg
AV Peak grad: 19.4 mmHg
Ao pk vel: 2.21 m/s
Area-P 1/2: 2.75 cm2
Height: 63 in
MV M vel: 2 m/s
MV Peak grad: 16 mmHg
MV VTI: 2.37 cm2
S' Lateral: 2.2 cm
Single Plane A4C EF: 78.7 %
Weight: 1911.83 oz

## 2022-10-24 LAB — BASIC METABOLIC PANEL
Anion gap: 6 (ref 5–15)
BUN: 11 mg/dL (ref 8–23)
CO2: 23 mmol/L (ref 22–32)
Calcium: 8.6 mg/dL — ABNORMAL LOW (ref 8.9–10.3)
Chloride: 103 mmol/L (ref 98–111)
Creatinine, Ser: 0.83 mg/dL (ref 0.44–1.00)
GFR, Estimated: >60 mL/min (ref >60)
Glucose, Bld: 119 mg/dL — ABNORMAL HIGH (ref 70–99)
Potassium: 3.9 mmol/L (ref 3.5–5.1)
Sodium: 132 mmol/L — ABNORMAL LOW (ref 135–145)

## 2022-10-24 LAB — CBC
HCT: 31.8 % — ABNORMAL LOW (ref 36.0–46.0)
Hemoglobin: 10.6 g/dL — ABNORMAL LOW (ref 12.0–15.0)
MCH: 28.6 pg (ref 26.0–34.0)
MCHC: 33.3 g/dL (ref 30.0–36.0)
MCV: 85.7 fL (ref 80.0–100.0)
Platelets: 184 10*3/uL (ref 150–400)
RBC: 3.71 MIL/uL — ABNORMAL LOW (ref 3.87–5.11)
RDW: 12.9 % (ref 11.5–15.5)
WBC: 9.6 10*3/uL (ref 4.0–10.5)
nRBC: 0 % (ref 0.0–0.2)

## 2022-10-24 LAB — MAGNESIUM: Magnesium: 1.8 mg/dL (ref 1.7–2.4)

## 2022-10-24 MED FILL — Fentanyl Citrate Preservative Free (PF) Inj 100 MCG/2ML: INTRAMUSCULAR | Qty: 1 | Status: AC

## 2022-10-24 NOTE — Progress Notes (Signed)
Discharge instructions (including medications) discussed with and copy provided to patient/caregiver 

## 2022-10-24 NOTE — Progress Notes (Signed)
  Echocardiogram 2D Echocardiogram has been performed.  Margaret Lucas 10/24/2022, 10:28 AM

## 2022-10-24 NOTE — Progress Notes (Signed)
CARDIAC REHAB PHASE I   PRE:  Rate/Rhythm: 82 SR  BP:  Sitting: 129/53      SaO2: 97 RA  MODE:  Ambulation: 56 ft   AD:   no AD  POST:  Rate/Rhythm: 96 SR  BP:  Sitting: 152/59      SaO2: 94 RA  Pt amb with contact guard assistance using gait belt, pt denies CP and SOB during amb and was returned to room w/o complaint.   Explained CRP2, restrictions, ex guidelines, and HH diet to pt and family. Pt is interested in CRP2 program.  Referral placed to: [facility] under cardiologist Dr. Peter [place]and  Qualifying Dx: 7/9 TAVR    Margaret Lucas  ACSM-CEP 9:03 AM 10/24/2022    Service time is from 0825 to 0903.

## 2022-10-24 NOTE — Plan of Care (Signed)

## 2022-10-25 ENCOUNTER — Telehealth: Payer: Self-pay | Admitting: Physician Assistant

## 2022-10-25 ENCOUNTER — Telehealth: Payer: Self-pay

## 2022-10-25 DIAGNOSIS — Z952 Presence of prosthetic heart valve: Secondary | ICD-10-CM | POA: Diagnosis not present

## 2022-10-25 DIAGNOSIS — I4510 Unspecified right bundle-branch block: Secondary | ICD-10-CM | POA: Diagnosis not present

## 2022-10-25 NOTE — Telephone Encounter (Addendum)
  [facility]   MULTIDISCIPLINARY HEART VALVE TEAM  Attempted TOC call. Left vm.  Cline Crock PA-C  [facility]

## 2022-10-25 NOTE — Telephone Encounter (Signed)
TOC done by Karsten Fells. See earlier phone note.   Cline Crock PA-C  [facility]

## 2022-10-25 NOTE — Telephone Encounter (Signed)
The patient requested to reschedule her 1 month echo/office visit as she has an eye injection 11/30/2022. Rescheduled her TAVR appointments to 12/05/2022. She was grateful for assistance.

## 2022-10-25 NOTE — Telephone Encounter (Signed)
The patient and her daughter called back to report aching and some redness at her groin sites. She has not taken the bandages off, but feels no swelling or hardness around the areas. There is no drainage on the bandages. While on the phone, she attempted to take the dressings off but had difficulty and was instructed to remove it later when she bathes as it may be easier.  She has not taken her temperature but does not feel like she does when she has a fever. She will take Tylenol, continue to monitor, and call in the morning for an update.

## 2022-10-26 NOTE — Telephone Encounter (Signed)
  [facility]   MULTIDISCIPLINARY HEART VALVE TEAM   Patient contacted regarding discharge from [facility] on 7/10  Patient understands to follow up with a structural heart APP on 7/17 at [address].  Patient understands discharge instructions? yes Patient understands medications and regimen? yes Patient understands to bring all medications to this visit? Yes  Has purple discoloration at incision site. Just mild tenderness. Reassured her this is normal.   Cline Crock PA-C  [facility]

## 2022-10-29 LAB — MULTIPLE MYELOMA PANEL, SERUM
Albumin SerPl Elph-Mcnc: 3.6 g/dL (ref 2.9–4.4)
Albumin/Glob SerPl: 1.3 (ref 0.7–1.7)
Alpha 1: 0.2 g/dL (ref 0.0–0.4)
Alpha2 Glob SerPl Elph-Mcnc: 0.7 g/dL (ref 0.4–1.0)
B-Globulin SerPl Elph-Mcnc: 1 g/dL (ref 0.7–1.3)
Gamma Glob SerPl Elph-Mcnc: 1 g/dL (ref 0.4–1.8)
Globulin, Total: 2.9 g/dL (ref 2.2–3.9)
IgA: 244 mg/dL (ref 64–422)
IgG (Immunoglobin G), Serum: 1095 mg/dL (ref 586–1602)
IgM (Immunoglobulin M), Srm: 85 mg/dL (ref 26–217)
Total Protein ELP: 6.5 g/dL (ref 6.0–8.5)

## 2022-10-30 NOTE — Progress Notes (Signed)
[facility]   MULTIDISCIPLINARY HEART VALVE CLINIC                                     Cardiology Office Note:    Date:  11/01/2022   ID:  Margaret Lucas, DOB Oct 22, 1934, MRN [id number]  PCP:  Irena Reichmann, DO  [facility] Cardiologist:  Peter Swaziland, MD / Dr. Lynnette Caffey & Dr. Laneta Simmers (TAVR)  [facility] Electrophysiologist:  None   Referring MD: Irena Reichmann, DO   TOC s/p TAVR  History of Present Illness:    Margaret Lucas is a 88 y.o. female with a hx of T2DM, RBBB, HTN, hypothyroidism, history of GI bleeding, mild obstructive CAD and moderately severe aortic stenosis s/p TAVR (10/23/22) who presents to clinic for follow up.   She has been followed by Dr. Peter Swaziland for aortic stenosis. She recently developed exertional fatigue, shortness of breath, LE edema as well as chest tightness and dizziness. Repeat echocardiogram on 08/30/2022 showed an EF 60% and moderate AS with a mean grad 30 mmHg, peak grad 51.3 mmHg, DVI 0.98, SVI 61, as well a mild MR & AI. Cardiac catheterization showed nonobstructive coronary disease with a mean gradient across aortic valve of 32.5 mmHg with a valve area of 0.84 cm.    The patient was evaluated by the multidisciplinary valve team and underwent successful TAVR with a 23 mm Edwards Sapien 3 Ultra Resilia 23 THV via the TF approach on 10/23/22. Post operative echo showed EF 65%, normally functioning TAVR with a mean gradient of 11 mmHg and no PVL. She was continued on home aspirin.  Today the patient presents to clinic for follow up. Here with her daughter. No CP or SOB. She has new LE edema L>R, but no orthopnea or PND. No dizziness or syncope. No blood in stool or urine. No palpitations.     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 Lynnette Caffey & Bartle   Severe aortic stenosis    Vertigo     Past Surgical History:  Procedure Laterality Date   CHOLECYSTECTOMY     COLONOSCOPY  2010 last   ESOPHAGOGASTRODUODENOSCOPY N/A 08/08/2016   Procedure: ESOPHAGOGASTRODUODENOSCOPY (EGD);  Surgeon: Iva Boop, MD;  Location: [facility] ENDOSCOPY;  Service: Endoscopy;  Laterality: N/A;   INTRAOPERATIVE TRANSTHORACIC ECHOCARDIOGRAM N/A 10/23/2022   Procedure: INTRAOPERATIVE TRANSTHORACIC ECHOCARDIOGRAM;  Surgeon: Orbie Pyo, MD;  Location: [facility] INVASIVE CV LAB;  Service: Open Heart Surgery;  Laterality: N/A;   RIGHT/LEFT HEART CATH AND CORONARY ANGIOGRAPHY N/A 09/21/2022   Procedure: RIGHT/LEFT HEART CATH AND CORONARY ANGIOGRAPHY;  Surgeon: Swaziland, Peter M, MD;  Location: [facility] INVASIVE CV LAB;  Service: Cardiovascular;  Laterality: N/A;   SKIN CANCER EXCISION     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    TRANSCATHETER AORTIC VALVE REPLACEMENT, TRANSFEMORAL N/A 10/23/2022   Procedure: Transcatheter Aortic Valve Replacement, Transfemoral;  Surgeon: Orbie Pyo, MD;  Location: [facility] INVASIVE CV LAB;  Service: Open Heart Surgery;  Laterality: N/A;   TUBAL LIGATION      Current Medications: Current Meds  Medication Sig   amLODipine (NORVASC) 10  MG tablet Take 10 mg by mouth at bedtime.   amoxicillin (AMOXIL) 500 MG tablet Take 4 tablets (2,000 mg total) by mouth as directed. 1 hour prior to dental work including cleanings   aspirin EC 81 MG tablet Take 1 tablet (81 mg total) by mouth daily. Swallow whole. (Patient taking differently: Take 81 mg by mouth at bedtime. Swallow whole.)   Cholecalciferol (VITAMIN D) 125 MCG (5000 UT) CAPS Take 5,000 Units by mouth at bedtime.   fexofenadine (ALLEGRA) 180 MG tablet Take 180 mg by mouth daily as needed for allergies.   fluticasone (FLONASE) 50 MCG/ACT nasal spray Place 1-2 sprays into both nostrils daily as needed for allergies.    furosemide (LASIX) 20 MG tablet Take 1 tablet (20 mg total) by mouth daily.   levothyroxine (SYNTHROID, LEVOTHROID) 100 MCG tablet Take 100 mcg by mouth daily before breakfast.   lisinopril (ZESTRIL) 40 MG tablet Take 1 tablet (40 mg total) by mouth daily.   meclizine (ANTIVERT) 25 MG tablet Take 1 tablet (25 mg total) by mouth 3 (three) times daily as needed for dizziness.   Multiple Vitamins-Minerals (PRESERVISION AREDS 2 PO) Take 1 tablet by mouth in the morning and at bedtime.   pantoprazole (PROTONIX) 40 MG tablet Take 1 tablet (40 mg total) by mouth 2 (two) times daily. (Patient taking differently: Take 40 mg by mouth daily in the afternoon.)     Allergies:   Chlorthalidone and Demerol [meperidine]   Social History   Socioeconomic History   Marital status: Widowed    Spouse name: Not on file   Number of children: 3   Years of education: Not on file   Highest education level: Not on file  Occupational History   Occupation: retired Runner, broadcasting/film/video  Tobacco Use   Smoking status: Never   Smokeless tobacco: Never  Substance and Sexual Activity   Alcohol use: No   Drug use: No   Sexual activity: Not on file  Other Topics Concern   Not on file  Social History Narrative   Not on file   Social Determinants of Health   Financial Resource Strain: Not on file  Food Insecurity: No Food Insecurity (10/23/2022)   Hunger Vital Sign    Worried About Running Out of Food in the Last Year: Never true    Ran Out of Food in the Last Year: Never true  Transportation Needs: No Transportation Needs (10/23/2022)   PRAPARE - Administrator, Civil Service (Medical): No    Lack of Transportation (Non-Medical): No  Physical Activity: Not on file  Stress: Not on file  Social Connections: Not on file     Family History: The patient's family history includes Aortic stenosis in her brother; Breast cancer (age of onset: 63) in her sister; Cancer in her sister; Kidney failure in her brother;  Pneumonia in her father; Stroke in her mother.  ROS:   Please see the history of present illness.    All other systems reviewed and are negative.  EKGs/Labs/Other Studies Reviewed:    Cardiac Studies & Procedures   CARDIAC CATHETERIZATION  CARDIAC CATHETERIZATION 09/21/2022  Narrative   Prox RCA lesion is 40% stenosed.   Mid RCA lesion is 35% stenosed.   Prox LAD to Mid LAD lesion is 30% stenosed.   LV end diastolic pressure is normal.   There is severe aortic valve stenosis.  Nonobstructive CAD Normal LV filling pressures. LVEDP 13 mm Hg, PCWP 16/7 with mean 8 mm Hg Normal  right heart pressures. PAP 28/3 mean 13 mm Hg Normal cardiac output 4.51 L/min, index 2.91 Severe aortic stenosis. Mean gradient 32.5 mm Hg. AVA 0.84 cm squared with index 0.54  Plan: refer to valvular heart team for consideration of TAVR  Findings Coronary Findings Diagnostic  Dominance: Right  Left Anterior Descending Prox LAD to Mid LAD lesion is 30% stenosed.  Left Circumflex  First Obtuse Marginal Branch Vessel is large in size.  Right Coronary Artery Prox RCA lesion is 40% stenosed. Mid RCA lesion is 35% stenosed.  Intervention  No interventions have been documented.     ECHOCARDIOGRAM  ECHOCARDIOGRAM COMPLETE 10/24/2022  Narrative ECHOCARDIOGRAM REPORT    Patient Name:   NARDIA GAJEWSKI Date of Exam: 10/24/2022 Medical Rec #:  [id number]       Height:       63.0 in Accession #:    [id number]      Weight:       119.5 lb Date of Birth:  06-Feb-1935      BSA:          1.553 m Patient Age:    87 years        BP:           152/59 mmHg Patient Gender: F               HR:           78 bpm. Exam Location:  Inpatient  Procedure: 2D Echo, Cardiac Doppler and Color Doppler  Indications:    Post TAVR evaluation  History:        Patient has prior history of Echocardiogram examinations, most recent 10/23/2022. Aortic Valve Disease. Mallory Weiss syndrome. Aortic Valve: 23 mm Sapien  prosthetic, stented (TAVR) valve is present in the aortic position. Procedure Date: 10/23/22.  Sonographer:    Milda Smart Referring Phys: [id number] Brenlyn Beshara R Abrahm Mancia  IMPRESSIONS   1. Left ventricular ejection fraction, by estimation, is 65 to 70%. The left ventricle has hyperdynamic function. The left ventricle has no regional wall motion abnormalities. There is mild concentric left ventricular hypertrophy. Left ventricular diastolic parameters are consistent with Grade II diastolic dysfunction (pseudonormalization). Elevated left atrial pressure. 2. Right ventricular systolic function is normal. The right ventricular size is normal. There is normal pulmonary artery systolic pressure. The estimated right ventricular systolic pressure is 29.8 mmHg. 3. The mitral valve is normal in structure. Trivial mitral valve regurgitation. 4. The aortic valve has been repaired/replaced. Aortic valve regurgitation is not visualized. There is a 23 mm Sapien prosthetic (TAVR) valve present in the aortic position. Procedure Date: 10/23/22. Echo findings are consistent with normal structure and function of the aortic valve prosthesis. Aortic valve mean gradient measures 11.0 mmHg. Aortic valve Vmax measures 2.20 m/s. 5. The inferior vena cava is normal in size with greater than 50% respiratory variability, suggesting right atrial pressure of 3 mmHg.  FINDINGS Left Ventricle: Left ventricular ejection fraction, by estimation, is 65 to 70%. The left ventricle has hyperdynamic function. The left ventricle has no regional wall motion abnormalities. The left ventricular internal cavity size was normal in size. There is mild concentric left ventricular hypertrophy. Left ventricular diastolic parameters are consistent with Grade II diastolic dysfunction (pseudonormalization). Elevated left atrial pressure.  Right Ventricle: The right ventricular size is normal. No increase in right ventricular wall thickness. Right  ventricular systolic function is normal. There is normal pulmonary artery systolic pressure. The tricuspid regurgitant velocity is 2.59 m/s, and with an assumed  right atrial pressure of 3 mmHg, the estimated right ventricular systolic pressure is 29.8 mmHg.  Left Atrium: Left atrial size was normal in size.  Right Atrium: Right atrial size was normal in size.  Pericardium: There is no evidence of pericardial effusion.  Mitral Valve: The mitral valve is normal in structure. Mild mitral annular calcification. Trivial mitral valve regurgitation, with centrally-directed jet. MV peak gradient, 6.2 mmHg. The mean mitral valve gradient is 4.0 mmHg.  Tricuspid Valve: The tricuspid valve is normal in structure. Tricuspid valve regurgitation is mild.  Aortic Valve: The aortic valve has been repaired/replaced. Aortic valve regurgitation is not visualized. Aortic valve mean gradient measures 11.0 mmHg. Aortic valve peak gradient measures 19.4 mmHg. Aortic valve area, by VTI measures 1.99 cm. There is a 23 mm Sapien prosthetic, stented (TAVR) valve present in the aortic position. Procedure Date: 10/23/22. Echo findings are consistent with normal structure and function of the aortic valve prosthesis.  Pulmonic Valve: The pulmonic valve was grossly normal. Pulmonic valve regurgitation is trivial. No evidence of pulmonic stenosis.  Aorta: The aortic root and ascending aorta are structurally normal, with no evidence of dilitation.  Venous: The inferior vena cava is normal in size with greater than 50% respiratory variability, suggesting right atrial pressure of 3 mmHg.  IAS/Shunts: No atrial level shunt detected by color flow Doppler.   LEFT VENTRICLE PLAX 2D LVIDd:         3.50 cm     Diastology LVIDs:         2.20 cm     LV e' medial:    6.96 cm/s LV PW:         1.20 cm     LV E/e' medial:  15.4 LV IVS:        1.10 cm     LV e' lateral:   6.42 cm/s LVOT diam:     1.80 cm     LV E/e' lateral: 16.7 LV  SV:         88 LV SV Index:   57 LVOT Area:     2.54 cm  LV Volumes (MOD) LV vol d, MOD A4C: 62.0 ml LV vol s, MOD A4C: 13.2 ml LV SV MOD A4C:     62.0 ml  RIGHT VENTRICLE             IVC RV Basal diam:  3.10 cm     IVC diam: 1.60 cm RV S prime:     12.80 cm/s TAPSE (M-mode): 2.0 cm  LEFT ATRIUM             Index        RIGHT ATRIUM           Index LA diam:        3.60 cm 2.32 cm/m   RA Area:     12.30 cm LA Vol (A2C):   33.4 ml 21.50 ml/m  RA Volume:   26.00 ml  16.74 ml/m LA Vol (A4C):   23.9 ml 15.38 ml/m LA Biplane Vol: 29.3 ml 18.86 ml/m AORTIC VALVE AV Area (Vmax):    1.82 cm AV Area (Vmean):   1.91 cm AV Area (VTI):     1.99 cm AV Vmax:           220.50 cm/s AV Vmean:          159.500 cm/s AV VTI:            0.441 m AV Peak Grad:  19.4 mmHg AV Mean Grad:      11.0 mmHg LVOT Vmax:         158.00 cm/s LVOT Vmean:        119.500 cm/s LVOT VTI:          0.346 m LVOT/AV VTI ratio: 0.78  AORTA Ao Root diam: 2.30 cm Ao Asc diam:  3.40 cm  MITRAL VALVE                TRICUSPID VALVE MV Area (PHT): 2.75 cm     TR Peak grad:   26.8 mmHg MV Area VTI:   2.37 cm     TR Mean grad:   18.0 mmHg MV Peak grad:  6.2 mmHg     TR Vmax:        259.00 cm/s MV Mean grad:  4.0 mmHg     TR Vmean:       205.0 cm/s MV Vmax:       1.25 m/s MV Vmean:      95.2 cm/s    SHUNTS MV Decel Time: 276 msec     Systemic VTI:  0.35 m MR Peak grad: 16.0 mmHg     Systemic Diam: 1.80 cm MR Vmax:      200.00 cm/s MV E velocity: 107.00 cm/s MV A velocity: 113.00 cm/s MV E/A ratio:  0.95  Mihai Croitoru MD Electronically signed by Thurmon Fair MD Signature Date/Time: 10/24/2022/12:54:33 PM    Final     CT SCANS  CT CORONARY MORPH W/CTA COR W/SCORE 10/02/2022  Addendum 10/02/2022  5:46 PM ADDENDUM REPORT: 10/02/2022 17:44  CLINICAL DATA:  Severe Aortic Stenosis.  EXAM: Cardiac TAVR CT  TECHNIQUE: A non-contrast, gated CT scan was obtained with axial slices of 3  mm through the heart for aortic valve calcium scoring. A 120 kV retrospective, gated, contrast cardiac scan was obtained. Gantry rotation speed was 250 msecs and collimation was 0.6 mm. Nitroglycerin was not given. The 3D data set was reconstructed in 5% intervals of the 0-95% of the R-R cycle. Systolic and diastolic phases were analyzed on a dedicated workstation using MPR, MIP, and VRT modes. The patient received 100 cc of contrast.  FINDINGS: Image quality: Excellent.  Noise artifact is: Limited.  Valve Morphology: Tricuspid aortic valve with diffuse severe thickening and severe calcifications. Restricted leaflet movement in systole.  Aortic Valve Calcium score: 1120  Aortic annular dimension:  Phase assessed: 35%  Annular area: 407 mm2  Annular perimeter: 72.4 mm  Max diameter: 24.9 mm  Min diameter: 20.7 mm  Annular and subannular calcification: None.  Membranous septum length: 9.5 mm  Optimal coplanar projection: LAO 17 CAU 6  Coronary Artery Height above Annulus:  Left Main: 11.5 mm  Right Coronary: 15.1 mm  Sinus of Valsalva Measurements:  Non-coronary: 28.5 mm  Right-coronary: 26.0 mm  Left-coronary: 27.3 mm  Sinus of Valsalva Height:  Non-coronary: 20.5 mm  Right-coronary: 20.3 mm  Left-coronary: 16.9 mm  Sinotubular Junction: 23 mm  Ascending Thoracic Aorta: 31 mm  Coronary Arteries: Normal coronary origin. Right dominance. The study was performed without use of NTG and is insufficient for plaque evaluation. Please refer to recent cardiac catheterization for coronary assessment.  Cardiac Morphology:  Right Atrium: Right atrial size is within normal limits.  Right Ventricle: The right ventricular cavity is within normal limits.  Left Atrium: Left atrial size is normal in size with no left atrial appendage filling defect.  Left Ventricle: The ventricular cavity size is within  normal limits.  Pulmonary arteries: Normal in size  without proximal filling defect.  Pulmonary veins: Normal pulmonary venous drainage.  Pericardium: Normal thickness with no significant effusion or calcium present.  Mitral Valve: The mitral valve is normal structure without significant calcification.  Extra-cardiac findings: See attached radiology report for non-cardiac structures.  IMPRESSION: 1. Annular measurements support a 23 mm S3. Perimeter is in between 26/29 mm Evolut Pro. Sinuses does not support a 29 mm device, and are borderline for a 26 mm device.  2. No significant annular or subannular calcifications.  3. Sufficient coronary to annulus distance.  4. Optimal Fluoroscopic Angle for Delivery: LAO 17 CAU 6  [place] T. Flora Lipps, MD   Electronically Signed By: Lennie Odor M.D. On: 10/02/2022 17:44  Narrative EXAM: OVER-READ INTERPRETATION  CT CHEST  The following report is a limited chest CT over-read performed by radiologist Dr. Allegra Lai of [facility] on 10/02/2022. This over-read does not include interpretation of cardiac or coronary anatomy or pathology. The cardiac TAVR interpretation by the cardiologist is attached.  COMPARISON:  None Available.  FINDINGS: Extracardiac findings will be described separately under dictation for contemporaneously obtained CTA chest, abdomen and pelvis.  IMPRESSION: Please see separate dictation for contemporaneously obtained CTA chest, abdomen and pelvis dated 10/02/2022 for full description of relevant extracardiac findings.  Electronically Signed: By: Allegra Lai M.D. On: 10/02/2022 12:22          EKG:  EKG is ordered today.  The ekg ordered today demonstrates sinus with RBBB and 1st deg AV block.   Recent Labs: 09/17/2022: TSH 2.310 10/22/2022: ALT 19 10/23/2022: BUN 11; Creatinine, Ser 0.83; Hemoglobin 10.6; Magnesium 1.8; Platelets 184; Potassium 3.9; Sodium 132  Recent Lipid Panel No results found for: "CHOL", "TRIG", "HDL",  "CHOLHDL", "VLDL", "LDLCALC", "LDLDIRECT"   Risk Assessment/Calculations:       Physical Exam:    VS:  BP 130/60   Pulse 72   Ht 5\' 3"  (1.6 m)   Wt 120 lb (54.4 kg)   SpO2 97%   BMI 21.26 kg/m     Wt Readings from Last 3 Encounters:  10/31/22 120 lb (54.4 kg)  10/24/22 119 lb 7.8 oz (54.2 kg)  10/22/22 120 lb (54.4 kg)     GEN:  Well nourished, well developed in no acute distress HEENT: Normal NECK: No JVD LYMPHATICS: No lymphadenopathy CARDIAC: RRR, no murmurs, rubs, gallops RESPIRATORY:  Clear to auscultation without rales, wheezing or rhonchi  ABDOMEN: Soft, non-tender, non-distended MUSCULOSKELETAL: No deformity 1+ bilateral LE edema L>R SKIN: Warm and dry.  Groin sites clear without hematoma. There is diffuse ecchymosis over the pubic bone. Left groin has a quarter sized lump over artery but no bruit. No tenderness. NEUROLOGIC:  Alert and oriented x 3 PSYCHIATRIC:  Normal affect   ASSESSMENT:    1. S/P TAVR (transcatheter aortic valve replacement)   2. Essential hypertension   3. RBBB   4. At risk for cardiac amyloidosis (HCC)   5. Pulmonary nodule   6. Bilateral leg edema    PLAN:    In order of problems listed above:  Severe AS s/p TAVR: doing well 1 week out from TAVR. ECG with no HAVB. Groin sites are stable. Continued on home Asprin 81mg  daily. SBE prophylaxis discussed; I have RX'd amoxicillin. I will see her back for 1 month echo and OV.    HTN: BP well controlled. No changes made   RBBB: wearing a Zio AT with no alerts for  HAVB   Elevated Raise Score: she has a Raise Score of 3 (RBBB and age) and meets criteria for amyloid screening. Amyloid labs obtained during admission and were unremarkable. Will get PYP set up. Sent a message to Thea Alken to get this added to one of her upcoming office visits.    Pulmonary nodule: pre TAVR CTs showed "new right lower lobe solid nodule measuring 7 mm. Non-contrast chest CT at 6-12 months is recommended. If the  nodule is stable at time of repeat CT, then future CT at 18-24 months (from today's scan) is considered optional for low-risk patients, but is recommended for high-risk patients." This was discussed today and we will get it set up at the 1 month visit.   New LE edema: will start Lasix 20mg  daily and see back next week.     Cardiac Rehabilitation Eligibility Assessment  The patient is ready to start cardiac rehabilitation from a cardiac standpoint.    Medication Adjustments/Labs and Tests Ordered: Current medicines are reviewed at length with the patient today.  Concerns regarding medicines are outlined above.  Orders Placed This Encounter  Procedures   MYOCARDIAL AMYLOID IMAGING PLANAR AND SPECT   EKG 12-Lead   Meds ordered this encounter  Medications   amoxicillin (AMOXIL) 500 MG tablet    Sig: Take 4 tablets (2,000 mg total) by mouth as directed. 1 hour prior to dental work including cleanings    Dispense:  12 tablet    Refill:  12    Order Specific Question:   Supervising Provider    Answer:   Excell Seltzer, MICHAEL [[id number]]   furosemide (LASIX) 20 MG tablet    Sig: Take 1 tablet (20 mg total) by mouth daily.    Dispense:  30 tablet    Refill:  6    Order Specific Question:   Supervising Provider    Answer:   Tonny Bollman [[id number]]    Patient Instructions  Medication Instructions:  Your physician has recommended you make the following change in your medication:  1.) start furosemide (Lasix) 20 mg - take one tablet daily  *If you need a refill on your cardiac medications before your next appointment, please call your pharmacy*   Lab Work: none  Testing/Procedures: Cardiac Amyloid Scan - we will call you to arrange   Follow-Up: In one week with Carlean Jews, PA-C       Signed, Cline Crock, PA-C  11/01/2022 8:19 AM    [facility]

## 2022-10-31 ENCOUNTER — Ambulatory Visit: Payer: Medicare PPO | Attending: Cardiology | Admitting: Physician Assistant

## 2022-10-31 ENCOUNTER — Ambulatory Visit: Payer: Medicare PPO

## 2022-10-31 ENCOUNTER — Other Ambulatory Visit: Payer: Self-pay | Admitting: Physician Assistant

## 2022-10-31 VITALS — BP 130/60 | HR 72 | Ht 63.0 in | Wt 120.0 lb

## 2022-10-31 DIAGNOSIS — R600 Localized edema: Secondary | ICD-10-CM | POA: Diagnosis not present

## 2022-10-31 DIAGNOSIS — R911 Solitary pulmonary nodule: Secondary | ICD-10-CM | POA: Diagnosis not present

## 2022-10-31 DIAGNOSIS — I43 Cardiomyopathy in diseases classified elsewhere: Secondary | ICD-10-CM

## 2022-10-31 DIAGNOSIS — E854 Organ-limited amyloidosis: Secondary | ICD-10-CM

## 2022-10-31 DIAGNOSIS — Z952 Presence of prosthetic heart valve: Secondary | ICD-10-CM | POA: Diagnosis not present

## 2022-10-31 DIAGNOSIS — I4510 Unspecified right bundle-branch block: Secondary | ICD-10-CM | POA: Diagnosis not present

## 2022-10-31 DIAGNOSIS — I10 Essential (primary) hypertension: Secondary | ICD-10-CM | POA: Diagnosis not present

## 2022-10-31 MED ORDER — AMOXICILLIN 500 MG PO TABS: 2000.0000 mg | ORAL_TABLET | ORAL | 12 refills | Status: DC

## 2022-10-31 MED ORDER — FUROSEMIDE 20 MG PO TABS: 20.0000 mg | ORAL_TABLET | Freq: Every day | ORAL | 6 refills | Status: DC

## 2022-10-31 NOTE — Patient Instructions (Addendum)
Medication Instructions:  Your physician has recommended you make the following change in your medication:  1.) start furosemide (Lasix) 20 mg - take one tablet daily  *If you need a refill on your cardiac medications before your next appointment, please call your pharmacy*   Lab Work: none  Testing/Procedures: Cardiac Amyloid Scan - we will call you to arrange   Follow-Up: In one week with Carlean Jews, PA-C

## 2022-11-01 ENCOUNTER — Telehealth: Payer: Self-pay | Admitting: Cardiology

## 2022-11-01 NOTE — Telephone Encounter (Signed)
Pt called to report that she is tired and does not want to pursue any amyloid testing at this time.. she has not been scheduled yet... she will keep her future follow up appts with Bary Castilla and her Echo.   I will forward to [facility] for review prior to removing the Imaging order.   Pt next OV 11/09/22

## 2022-11-01 NOTE — Telephone Encounter (Signed)
Patient stated she wants to cancel [facility] scheduled on 7/26 as patient stated she is not ready to start this at this time.

## 2022-11-05 ENCOUNTER — Telehealth (HOSPITAL_COMMUNITY): Payer: Self-pay

## 2022-11-05 NOTE — Telephone Encounter (Signed)
Pt called and will not be able to start Cardiac Rehab just yet! She has testing that has to be done for something else that is going on and will call when she is ready to start! Closing referral for now. Will reopen when pt calls.

## 2022-11-08 NOTE — Progress Notes (Signed)
[facility]   MULTIDISCIPLINARY HEART VALVE CLINIC                                     Cardiology Office Note:    Date:  11/09/2022   ID:  Margaret Lucas, DOB February 15, 1935, MRN [id number]  PCP:  Margaret Reichmann, DO  [facility] Cardiologist:  Margaret [place]az[place], MD / Dr. Lynnette Lucas & Dr. Laneta Lucas (TAVR)  [facility] Electrophysiologist:  None   Referring MD: Margaret Reichmann, DO   Follow up LE edema.   History of Present Illness:    Margaret Lucas is a 88 y.o. female with a hx of T2DM, RBBB, HTN, hypothyroidism, history of GI bleeding, mild obstructive CAD and moderately severe aortic stenosis s/p TAVR (10/23/22) who presents to clinic for follow up.   She has been followed by Dr. Peter Lucas for aortic stenosis. She recently developed exertional fatigue, shortness of breath, LE edema as well as chest tightness and dizziness. Repeat echocardiogram on 08/30/2022 showed an EF 60% and moderate AS with a mean grad 30 mmHg, peak grad 51.3 mmHg, DVI 0.98, SVI 61, as well a mild MR & AI. Cardiac catheterization showed nonobstructive coronary disease with a mean gradient across aortic valve of 32.5 mmHg with a valve area of 0.84 cm.    The patient was evaluated by the multidisciplinary valve team and underwent successful TAVR with a 23 mm Edwards Sapien 3 Ultra Resilia 23 THV via the TF approach on 10/23/22. Post operative echo showed EF 65%, normally functioning TAVR with a mean gradient of 11 mmHg and no PVL. She was continued on home aspirin.  Seen for follow up and doing well but had new LE edema. Started on lasix 20 mg daily.  Today the patient presents to clinic for follow up. No CP or SOB. No orthopnea or PND. No dizziness or syncope. No blood in stool or urine. No palpitations. Lost 6 lbs on lasix. Legs are now back to normal. Has been reading about cardiac amyloid and reports previous bilateral carpal tunnel and wants to proceed with PYP.     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 Lynnette Lucas & Bartle   Severe aortic stenosis    Vertigo     Past Surgical History:  Procedure Laterality Date   CHOLECYSTECTOMY     COLONOSCOPY  2010 last   ESOPHAGOGASTRODUODENOSCOPY N/A 08/08/2016   Procedure: ESOPHAGOGASTRODUODENOSCOPY (EGD);  Surgeon: Iva Boop, MD;  Location[facility] ENDOSCOPY;  Service: Endoscopy;  Laterality: N/A;   INTRAOPERATIVE TRANSTHORACIC ECHOCARDIOGRAM N/A 10/23/2022   Procedure: INTRAOPERATIVE TRANSTHORACIC ECHOCARDIOGRAM;  Surgeon: Orbie Pyo, MD;  Location: [facility] INVASIVE CV LAB;  Service: Open Heart Surgery;  Laterality: N/A;   RIGHT/LEFT HEART CATH AND CORONARY ANGIOGRAPHY N/A 09/21/2022   Procedure: RIGHT/LEFT HEART CATH AND CORONARY ANGIOGRAPHY;  Surgeon: Lucas, Margaret M, MD;  Location: [facility] INVASIVE CV LAB;  Service: Cardiovascular;  Laterality: N/A;   SKIN CANCER EXCISION     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    TRANSCATHETER AORTIC VALVE REPLACEMENT, TRANSFEMORAL N/A 10/23/2022   Procedure: Transcatheter Aortic Valve Replacement, Transfemoral;  Surgeon:  Orbie Pyo, MD;  Location: [facility] INVASIVE CV LAB;  Service: Open Heart Surgery;  Laterality: N/A;   TUBAL LIGATION      Current Medications: Current Meds  Medication Sig   amLODipine (NORVASC) 10 MG tablet Take 10 mg by mouth at bedtime.   amoxicillin (AMOXIL) 500 MG tablet Take 4 tablets (2,000 mg total) by mouth as directed. 1 hour prior to dental work including cleanings   aspirin EC 81 MG tablet Take 1 tablet (81 mg total) by mouth daily. Swallow whole. (Patient taking differently: Take 81 mg by mouth at bedtime. Swallow whole.)   Cholecalciferol (VITAMIN D) 125 MCG (5000 UT) CAPS Take 5,000 Units by mouth at bedtime.    fexofenadine (ALLEGRA) 180 MG tablet Take 180 mg by mouth daily as needed for allergies.   fluticasone (FLONASE) 50 MCG/ACT nasal spray Place 1-2 sprays into both nostrils daily as needed for allergies.   furosemide (LASIX) 20 MG tablet Take 1 tablet (20 mg total) by mouth daily.   levothyroxine (SYNTHROID, LEVOTHROID) 100 MCG tablet Take 100 mcg by mouth daily before breakfast.   lisinopril (ZESTRIL) 40 MG tablet Take 1 tablet (40 mg total) by mouth daily.   meclizine (ANTIVERT) 25 MG tablet Take 1 tablet (25 mg total) by mouth 3 (three) times daily as needed for dizziness.   Multiple Vitamins-Minerals (PRESERVISION AREDS 2 PO) Take 1 tablet by mouth in the morning and at bedtime.   pantoprazole (PROTONIX) 40 MG tablet Take 1 tablet (40 mg total) by mouth 2 (two) times daily. (Patient taking differently: Take 40 mg by mouth daily in the afternoon.)     Allergies:   Chlorthalidone and Demerol [meperidine]   Social History   Socioeconomic History   Marital status: Widowed    Spouse name: Not on file   Number of children: 3   Years of education: Not on file   Highest education level: Not on file  Occupational History   Occupation: retired Runner, broadcasting/film/video  Tobacco Use   Smoking status: Never   Smokeless tobacco: Never  Substance and Sexual Activity   Alcohol use: No   Drug use: No   Sexual activity: Not on file  Other Topics Concern   Not on file  Social History Narrative   Not on file   Social Determinants of Health   Financial Resource Strain: Not on file  Food Insecurity: No Food Insecurity (10/23/2022)   Hunger Vital Sign    Worried About Running Out of Food in the Last Year: Never true    Ran Out of Food in the Last Year: Never true  Transportation Needs: No Transportation Needs (10/23/2022)   PRAPARE - Administrator, Civil Service (Medical): No    Lack of Transportation (Non-Medical): No  Physical Activity: Not on file  Stress: Not on file  Social Connections: Not on  file     Family History: The patient's family history includes Aortic stenosis in her brother; Breast cancer (age of onset: 33) in her sister; Cancer in her sister; Kidney failure in her brother; Pneumonia in her father; Stroke in her mother.  ROS:   Please see the history of present illness.    All other systems reviewed and are negative.  EKGs/Labs/Other Studies Reviewed:    Cardiac Studies & Procedures   CARDIAC CATHETERIZATION  CARDIAC CATHETERIZATION 09/21/2022  Narrative   Prox RCA lesion is 40% stenosed.   Mid RCA lesion is 35% stenosed.   Prox LAD to Mid LAD  lesion is 30% stenosed.   LV end diastolic pressure is normal.   There is severe aortic valve stenosis.  Nonobstructive CAD Normal LV filling pressures. LVEDP 13 mm Hg, PCWP 16/7 with mean 8 mm Hg Normal right heart pressures. PAP 28/3 mean 13 mm Hg Normal cardiac output 4.51 L/min, index 2.91 Severe aortic stenosis. Mean gradient 32.5 mm Hg. AVA 0.84 cm squared with index 0.54  Plan: refer to valvular heart team for consideration of TAVR  Findings Coronary Findings Diagnostic  Dominance: Right  Left Anterior Descending Prox LAD to Mid LAD lesion is 30% stenosed.  Left Circumflex  First Obtuse Marginal Branch Vessel is large in size.  Right Coronary Artery Prox RCA lesion is 40% stenosed. Mid RCA lesion is 35% stenosed.  Intervention  No interventions have been documented.     ECHOCARDIOGRAM  ECHOCARDIOGRAM COMPLETE 10/24/2022  Narrative ECHOCARDIOGRAM REPORT    Patient Name:   Margaret Lucas Date of Exam: 10/24/2022 Medical Rec #:  [id number]       Height:       63.0 in Accession #:    [id number]      Weight:       119.5 lb Date of Birth:  12-21-1934      BSA:          1.553 m Patient Age:    87 years        BP:           152/59 mmHg Patient Gender: F               HR:           78 bpm. Exam Location:  Inpatient  Procedure: 2D Echo, Cardiac Doppler and Color Doppler  Indications:     Post TAVR evaluation  History:        Patient has prior history of Echocardiogram examinations, most recent 10/23/2022. Aortic Valve Disease. Mallory Weiss syndrome. Aortic Valve: 23 mm Sapien prosthetic, stented (TAVR) valve is present in the aortic position. Procedure Date: 10/23/22.  Sonographer:    Milda Smart Referring Phys: [id number] Vrishank Moster R Omar Gayden  IMPRESSIONS   1. Left ventricular ejection fraction, by estimation, is 65 to 70%. The left ventricle has hyperdynamic function. The left ventricle has no regional wall motion abnormalities. There is mild concentric left ventricular hypertrophy. Left ventricular diastolic parameters are consistent with Grade II diastolic dysfunction (pseudonormalization). Elevated left atrial pressure. 2. Right ventricular systolic function is normal. The right ventricular size is normal. There is normal pulmonary artery systolic pressure. The estimated right ventricular systolic pressure is 29.8 mmHg. 3. The mitral valve is normal in structure. Trivial mitral valve regurgitation. 4. The aortic valve has been repaired/replaced. Aortic valve regurgitation is not visualized. There is a 23 mm Sapien prosthetic (TAVR) valve present in the aortic position. Procedure Date: 10/23/22. Echo findings are consistent with normal structure and function of the aortic valve prosthesis. Aortic valve mean gradient measures 11.0 mmHg. Aortic valve Vmax measures 2.20 m/s. 5. The inferior vena cava is normal in size with greater than 50% respiratory variability, suggesting right atrial pressure of 3 mmHg.  FINDINGS Left Ventricle: Left ventricular ejection fraction, by estimation, is 65 to 70%. The left ventricle has hyperdynamic function. The left ventricle has no regional wall motion abnormalities. The left ventricular internal cavity size was normal in size. There is mild concentric left ventricular hypertrophy. Left ventricular diastolic parameters are consistent with Grade  II diastolic dysfunction (pseudonormalization). Elevated left atrial pressure.  Right Ventricle: The right ventricular size is normal. No increase in right ventricular wall thickness. Right ventricular systolic function is normal. There is normal pulmonary artery systolic pressure. The tricuspid regurgitant velocity is 2.59 m/s, and with an assumed right atrial pressure of 3 mmHg, the estimated right ventricular systolic pressure is 29.8 mmHg.  Left Atrium: Left atrial size was normal in size.  Right Atrium: Right atrial size was normal in size.  Pericardium: There is no evidence of pericardial effusion.  Mitral Valve: The mitral valve is normal in structure. Mild mitral annular calcification. Trivial mitral valve regurgitation, with centrally-directed jet. MV peak gradient, 6.2 mmHg. The mean mitral valve gradient is 4.0 mmHg.  Tricuspid Valve: The tricuspid valve is normal in structure. Tricuspid valve regurgitation is mild.  Aortic Valve: The aortic valve has been repaired/replaced. Aortic valve regurgitation is not visualized. Aortic valve mean gradient measures 11.0 mmHg. Aortic valve peak gradient measures 19.4 mmHg. Aortic valve area, by VTI measures 1.99 cm. There is a 23 mm Sapien prosthetic, stented (TAVR) valve present in the aortic position. Procedure Date: 10/23/22. Echo findings are consistent with normal structure and function of the aortic valve prosthesis.  Pulmonic Valve: The pulmonic valve was grossly normal. Pulmonic valve regurgitation is trivial. No evidence of pulmonic stenosis.  Aorta: The aortic root and ascending aorta are structurally normal, with no evidence of dilitation.  Venous: The inferior vena cava is normal in size with greater than 50% respiratory variability, suggesting right atrial pressure of 3 mmHg.  IAS/Shunts: No atrial level shunt detected by color flow Doppler.   LEFT VENTRICLE PLAX 2D LVIDd:         3.50 cm     Diastology LVIDs:         2.20  cm     LV e' medial:    6.96 cm/s LV PW:         1.20 cm     LV E/e' medial:  15.4 LV IVS:        1.10 cm     LV e' lateral:   6.42 cm/s LVOT diam:     1.80 cm     LV E/e' lateral: 16.7 LV SV:         88 LV SV Index:   57 LVOT Area:     2.54 cm  LV Volumes (MOD) LV vol d, MOD A4C: 62.0 ml LV vol s, MOD A4C: 13.2 ml LV SV MOD A4C:     62.0 ml  RIGHT VENTRICLE             IVC RV Basal diam:  3.10 cm     IVC diam: 1.60 cm RV S prime:     12.80 cm/s TAPSE (M-mode): 2.0 cm  LEFT ATRIUM             Index        RIGHT ATRIUM           Index LA diam:        3.60 cm 2.32 cm/m   RA Area:     12.30 cm LA Vol (A2C):   33.4 ml 21.50 ml/m  RA Volume:   26.00 ml  16.74 ml/m LA Vol (A4C):   23.9 ml 15.38 ml/m LA Biplane Vol: 29.3 ml 18.86 ml/m AORTIC VALVE AV Area (Vmax):    1.82 cm AV Area (Vmean):   1.91 cm AV Area (VTI):     1.99 cm AV Vmax:  220.50 cm/s AV Vmean:          159.500 cm/s AV VTI:            0.441 m AV Peak Grad:      19.4 mmHg AV Mean Grad:      11.0 mmHg LVOT Vmax:         158.00 cm/s LVOT Vmean:        119.500 cm/s LVOT VTI:          0.346 m LVOT/AV VTI ratio: 0.78  AORTA Ao Root diam: 2.30 cm Ao Asc diam:  3.40 cm  MITRAL VALVE                TRICUSPID VALVE MV Area (PHT): 2.75 cm     TR Peak grad:   26.8 mmHg MV Area VTI:   2.37 cm     TR Mean grad:   18.0 mmHg MV Peak grad:  6.2 mmHg     TR Vmax:        259.00 cm/s MV Mean grad:  4.0 mmHg     TR Vmean:       205.0 cm/s MV Vmax:       1.25 m/s MV Vmean:      95.2 cm/s    SHUNTS MV Decel Time: 276 msec     Systemic VTI:  0.35 m MR Peak grad: 16.0 mmHg     Systemic Diam: 1.80 cm MR Vmax:      200.00 cm/s MV E velocity: 107.00 cm/s MV A velocity: 113.00 cm/s MV E/A ratio:  0.95  Mihai Croitoru MD Electronically signed by Thurmon Fair MD Signature Date/Time: 10/24/2022/12:54:33 PM    Final     CT SCANS  CT CORONARY MORPH W/CTA COR W/SCORE 10/02/2022  Addendum 10/02/2022  5:46  PM ADDENDUM REPORT: 10/02/2022 17:44  CLINICAL DATA:  Severe Aortic Stenosis.  EXAM: Cardiac TAVR CT  TECHNIQUE: A non-contrast, gated CT scan was obtained with axial slices of 3 mm through the heart for aortic valve calcium scoring. A 120 kV retrospective, gated, contrast cardiac scan was obtained. Gantry rotation speed was 250 msecs and collimation was 0.6 mm. Nitroglycerin was not given. The 3D data set was reconstructed in 5% intervals of the 0-95% of the R-R cycle. Systolic and diastolic phases were analyzed on a dedicated workstation using MPR, MIP, and VRT modes. The patient received 100 cc of contrast.  FINDINGS: Image quality: Excellent.  Noise artifact is: Limited.  Valve Morphology: Tricuspid aortic valve with diffuse severe thickening and severe calcifications. Restricted leaflet movement in systole.  Aortic Valve Calcium score: 1120  Aortic annular dimension:  Phase assessed: 35%  Annular area: 407 mm2  Annular perimeter: 72.4 mm  Max diameter: 24.9 mm  Min diameter: 20.7 mm  Annular and subannular calcification: None.  Membranous septum length: 9.5 mm  Optimal coplanar projection: LAO 17 CAU 6  Coronary Artery Height above Annulus:  Left Main: 11.5 mm  Right Coronary: 15.1 mm  Sinus of Valsalva Measurements:  Non-coronary: 28.5 mm  Right-coronary: 26.0 mm  Left-coronary: 27.3 mm  Sinus of Valsalva Height:  Non-coronary: 20.5 mm  Right-coronary: 20.3 mm  Left-coronary: 16.9 mm  Sinotubular Junction: 23 mm  Ascending Thoracic Aorta: 31 mm  Coronary Arteries: Normal coronary origin. Right dominance. The study was performed without use of NTG and is insufficient for plaque evaluation. Please refer to recent cardiac catheterization for coronary assessment.  Cardiac Morphology:  Right Atrium: Right atrial size is within normal  limits.  Right Ventricle: The right ventricular cavity is within normal limits.  Left Atrium: Left  atrial size is normal in size with no left atrial appendage filling defect.  Left Ventricle: The ventricular cavity size is within normal limits.  Pulmonary arteries: Normal in size without proximal filling defect.  Pulmonary veins: Normal pulmonary venous drainage.  Pericardium: Normal thickness with no significant effusion or calcium present.  Mitral Valve: The mitral valve is normal structure without significant calcification.  Extra-cardiac findings: See attached radiology report for non-cardiac structures.  IMPRESSION: 1. Annular measurements support a 23 mm S3. Perimeter is in between 26/29 mm Evolut Pro. Sinuses does not support a 29 mm device, and are borderline for a 26 mm device.  2. No significant annular or subannular calcifications.  3. Sufficient coronary to annulus distance.  4. Optimal Fluoroscopic Angle for Delivery: LAO 17 CAU 6  Nyssa T. Flora Lipps, MD   Electronically Signed By: Lennie Odor M.D. On: 10/02/2022 17:44  Narrative EXAM: OVER-READ INTERPRETATION  CT CHEST  The following report is a limited chest CT over-read performed by radiologist Dr. Allegra Lai of [facility] on 10/02/2022. This over-read does not include interpretation of cardiac or coronary anatomy or pathology. The cardiac TAVR interpretation by the cardiologist is attached.  COMPARISON:  None Available.  FINDINGS: Extracardiac findings will be described separately under dictation for contemporaneously obtained CTA chest, abdomen and pelvis.  IMPRESSION: Please see separate dictation for contemporaneously obtained CTA chest, abdomen and pelvis dated 10/02/2022 for full description of relevant extracardiac findings.  Electronically Signed: By: Allegra Lai M.D. On: 10/02/2022 12:22          EKG:  EKG is NOT ordered today.    Recent Labs: 09/17/2022: TSH 2.310 10/22/2022: ALT 19 10/23/2022: BUN 11; Creatinine, Ser 0.83; Hemoglobin 10.6; Magnesium  1.8; Platelets 184; Potassium 3.9; Sodium 132  Recent Lipid Panel No results found for: "CHOL", "TRIG", "HDL", "CHOLHDL", "VLDL", "LDLCALC", "LDLDIRECT"   Risk Assessment/Calculations:       Physical Exam:    VS:  BP (!) 142/50   Pulse 78   Ht 5\' 3"  (1.6 m)   Wt 114 lb 3.2 oz (51.8 kg)   SpO2 98%   BMI 20.23 kg/m      Today's Vitals   11/09/22 0911 11/09/22 0954  BP: (!) 172/64 (!) 142/50  Pulse: 78   SpO2: 98%   Weight: 114 lb 3.2 oz (51.8 kg)   Height: 5\' 3"  (1.6 m)    Body mass index is 20.23 kg/m.   Wt Readings from Last 3 Encounters:  11/09/22 114 lb 3.2 oz (51.8 kg)  10/31/22 120 lb (54.4 kg)  10/24/22 119 lb 7.8 oz (54.2 kg)     GEN:  Well nourished, well developed in no acute distress HEENT: Normal NECK: No JVD LYMPHATICS: No lymphadenopathy CARDIAC: RRR, no murmurs, rubs, gallops RESPIRATORY:  Clear to auscultation without rales, wheezing or rhonchi  ABDOMEN: Soft, non-tender, non-distended MUSCULOSKELETAL: No deformity  SKIN: Warm and dry.  NEUROLOGIC:  Alert and oriented x 3 PSYCHIATRIC:  Normal affect   ASSESSMENT:    1. S/P TAVR (transcatheter aortic valve replacement)   2. Essential hypertension   3. RBBB   4. At risk for cardiac amyloidosis (HCC)   5. Pulmonary nodule   6. Bilateral leg edema     PLAN:    In order of problems listed above:  Severe AS s/p TAVR: plan for echo and follow up next month  HTN: BP elevated today 172/64 on initial check. This improved to 142/50 after sitting quietly. She will keep a log at home and bring back to me next visit.    RBBB: [employer] AT sent back to company. No high risk alerts.    Elevated Raise Score: she has a Raise Score of 3 (RBBB and age) and meets criteria for amyloid screening. Amyloid labs obtained during admission and were unremarkable. Pt initially declined PYP scanning, but has read more about it and now wants to proceed. This has been set up for next month.     Pulmonary nodule: pre  TAVR CTs showed "new right lower lobe solid nodule measuring 7 mm. Non-contrast chest CT at 6-12 months is recommended. If the nodule is stable at time of repeat CT, then future CT at 18-24 months (from today's scan) is considered optional for low-risk patients, but is recommended for high-risk patients." Will set up at 1 month visit.   New LE edema: started on Lasix 20mg  daily with significant improvement in LE edema. Weight down 6lbs. Check BMET today   Medication Adjustments/Labs and Tests Ordered: Current medicines are reviewed at length with the patient today.  Concerns regarding medicines are outlined above.  Orders Placed This Encounter  Procedures   Basic metabolic panel   No orders of the defined types were placed in this encounter.   Patient Instructions  Medication Instructions:  Your physician recommends that you continue on your current medications as directed. Please refer to the Current Medication list given to you today.  *If you need a refill on your cardiac medications before your next appointment, please call your pharmacy*   Lab Work: Bmp - today   If you have labs (blood work) drawn today and your tests are completely normal, you will receive your results only by: MyChart Message (if you have MyChart) OR A paper copy in the mail If you have any lab test that is abnormal or we need to change your treatment, we will call you to review the results.   Testing/Procedures: Follow up as scheduled    Follow-Up: Follow up as scheduled   Other Instructions Please keep a log of your blood pressures and bring your readings with you to your next visit     Signed, Cline Crock, Cordelia Poche  11/09/2022 9:57 AM    [facility]

## 2022-11-09 ENCOUNTER — Ambulatory Visit: Payer: Medicare PPO | Attending: Physician Assistant | Admitting: Physician Assistant

## 2022-11-09 VITALS — BP 142/50 | HR 78 | Ht 63.0 in | Wt 114.2 lb

## 2022-11-09 DIAGNOSIS — R600 Localized edema: Secondary | ICD-10-CM | POA: Diagnosis not present

## 2022-11-09 DIAGNOSIS — Z952 Presence of prosthetic heart valve: Secondary | ICD-10-CM | POA: Diagnosis not present

## 2022-11-09 DIAGNOSIS — R911 Solitary pulmonary nodule: Secondary | ICD-10-CM | POA: Diagnosis not present

## 2022-11-09 DIAGNOSIS — I4510 Unspecified right bundle-branch block: Secondary | ICD-10-CM | POA: Diagnosis not present

## 2022-11-09 DIAGNOSIS — I10 Essential (primary) hypertension: Secondary | ICD-10-CM

## 2022-11-09 DIAGNOSIS — I43 Cardiomyopathy in diseases classified elsewhere: Secondary | ICD-10-CM

## 2022-11-09 DIAGNOSIS — E854 Organ-limited amyloidosis: Secondary | ICD-10-CM

## 2022-11-09 LAB — BASIC METABOLIC PANEL
BUN: 11 mg/dL (ref 8–27)
Chloride: 100 mmol/L (ref 96–106)
Potassium: 4.7 mmol/L (ref 3.5–5.2)
Sodium: 137 mmol/L (ref 134–144)

## 2022-11-09 NOTE — Patient Instructions (Signed)
Medication Instructions:  Your physician recommends that you continue on your current medications as directed. Please refer to the Current Medication list given to you today.  *If you need a refill on your cardiac medications before your next appointment, please call your pharmacy*   Lab Work: Bmp - today   If you have labs (blood work) drawn today and your tests are completely normal, you will receive your results only by: MyChart Message (if you have MyChart) OR A paper copy in the mail If you have any lab test that is abnormal or we need to change your treatment, we will call you to review the results.   Testing/Procedures: Follow up as scheduled    Follow-Up: Follow up as scheduled   Other Instructions Please keep a log of your blood pressures and bring your readings with you to your next visit

## 2022-11-30 ENCOUNTER — Other Ambulatory Visit (HOSPITAL_COMMUNITY): Payer: Medicare PPO

## 2022-11-30 ENCOUNTER — Ambulatory Visit: Payer: Medicare PPO

## 2022-11-30 DIAGNOSIS — H353231 Exudative age-related macular degeneration, bilateral, with active choroidal neovascularization: Secondary | ICD-10-CM | POA: Diagnosis not present

## 2022-12-03 ENCOUNTER — Telehealth (HOSPITAL_COMMUNITY): Payer: Self-pay | Admitting: *Deleted

## 2022-12-03 NOTE — Telephone Encounter (Signed)
Patient reminded of upcoming Amyloid appt.  Margaret Lucas

## 2022-12-04 NOTE — Progress Notes (Unsigned)
[facility]   MULTIDISCIPLINARY HEART VALVE CLINIC                                     Cardiology Office Note:    Date:  12/05/2022   ID:  Margaret Lucas, DOB [birth year], MRN [id number]  PCP:  Irena Reichmann, DO  [facility] Cardiologist:  Peter Swaziland, MD / Dr. Lynnette Caffey & Dr. Laneta Simmers (TAVR)  [facility] Electrophysiologist:  None   Referring MD: Irena Reichmann, DO   Chief Complaint  Patient presents with   Follow-up    1 month s/p TAVR   History of Present Illness:    Margaret Lucas is a 88 y.o. female with a hx of T2DM, RBBB, HTN, hypothyroidism, history of GI bleeding, mild obstructive CAD and moderately severe aortic stenosis s/p TAVR (10/23/22) who presents to clinic for one month follow up.    Ms. Silver has been followed by Dr. Peter Swaziland for aortic stenosis. She recently developed exertional fatigue, shortness of breath, LE edema as well as chest tightness and dizziness. Repeat echocardiogram on 08/30/2022 showed an EF 60% and moderate AS with a mean grad 30 mmHg, peak grad 51.3 mmHg, DVI 0.98, SVI 61, as well a mild MR & AI. Cardiac catheterization showed nonobstructive coronary disease with a mean gradient across aortic valve of 32.5 mmHg with a valve area of 0.84 cm.    The patient was evaluated by the multidisciplinary valve team and underwent successful TAVR with a 23 mm Edwards Sapien 3 Ultra Resilia 23 THV via the TF approach on 10/23/22. Post operative echo showed EF 65%, normally functioning TAVR with a mean gradient of 11 mmHg and no PVL. She was continued on home aspirin.   Seen for follow up and was doing well but had new LE edema. She was started on Lasix 20 mg daily. Today she is here alone and reports that her edema has improved and she has overall been doing very well. She has some complaints of bilateral upper thigh aching which she feels may be related to her increased activity level with walking. Otherwise she denies chest pain, SOB,  palpitations, LE edema, orthopnea, PND, dizziness, or syncope. Denies bleeding in stool or urine.     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 Lynnette Caffey & Bartle   Severe aortic stenosis    Vertigo     Past Surgical History:  Procedure Laterality Date   CHOLECYSTECTOMY     COLONOSCOPY  2010 last   ESOPHAGOGASTRODUODENOSCOPY N/A 08/08/2016   Procedure: ESOPHAGOGASTRODUODENOSCOPY (EGD);  Surgeon: Iva Boop, MD;  Location: [facility] ENDOSCOPY;  Service: Endoscopy;  Laterality: N/A;   INTRAOPERATIVE TRANSTHORACIC ECHOCARDIOGRAM N/A 10/23/2022   Procedure: INTRAOPERATIVE TRANSTHORACIC ECHOCARDIOGRAM;  Surgeon: Orbie Pyo, MD;  Location: [facility] INVASIVE CV LAB;  Service: Open Heart Surgery;  Laterality: N/A;   RIGHT/LEFT HEART CATH AND CORONARY ANGIOGRAPHY N/A 09/21/2022   Procedure: RIGHT/LEFT HEART CATH AND CORONARY ANGIOGRAPHY;  Surgeon: Swaziland, Peter M, MD;  Location: [facility] INVASIVE CV LAB;  Service: Cardiovascular;  Laterality: N/A;   SKIN CANCER EXCISION     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    TRANSCATHETER AORTIC VALVE REPLACEMENT, TRANSFEMORAL N/A 10/23/2022   Procedure: Transcatheter Aortic Valve Replacement, Transfemoral;  Surgeon: Orbie Pyo, MD;  Location: [facility] INVASIVE CV LAB;  Service: Open Heart Surgery;  Laterality: N/A;   TUBAL LIGATION      Current Medications: Current Meds  Medication Sig   amLODipine (NORVASC) 10 MG tablet Take 10 mg by mouth at bedtime.   amoxicillin (AMOXIL) 500 MG tablet Take 4 tablets (2,000 mg total) by mouth as directed. 1 hour prior to dental work including cleanings   aspirin EC 81 MG tablet Take 1 tablet (81 mg total) by mouth daily. Swallow whole. (Patient taking differently: Take 81  mg by mouth at bedtime. Swallow whole.)   Cholecalciferol (VITAMIN D) 125 MCG (5000 UT) CAPS Take 5,000 Units by mouth at bedtime.   fexofenadine (ALLEGRA) 180 MG tablet Take 180 mg by mouth daily as needed for allergies.   fluticasone (FLONASE) 50 MCG/ACT nasal spray Place 1-2 sprays into both nostrils daily as needed for allergies.   furosemide (LASIX) 20 MG tablet Take 1 tablet (20 mg total) by mouth daily.   levothyroxine (SYNTHROID, LEVOTHROID) 100 MCG tablet Take 100 mcg by mouth daily before breakfast.   lisinopril (ZESTRIL) 40 MG tablet Take 1 tablet (40 mg total) by mouth daily.   meclizine (ANTIVERT) 25 MG tablet Take 1 tablet (25 mg total) by mouth 3 (three) times daily as needed for dizziness.   Multiple Vitamins-Minerals (PRESERVISION AREDS 2 PO) Take 1 tablet by mouth in the morning and at bedtime.   pantoprazole (PROTONIX) 40 MG tablet Take 1 tablet (40 mg total) by mouth 2 (two) times daily. (Patient taking differently: Take 40 mg by mouth daily in the afternoon.)     Allergies:   Chlorthalidone and Demerol [meperidine]   Social History   Socioeconomic History   Marital status: Widowed    Spouse name: Not on file   Number of children: 3   Years of education: Not on file   Highest education level: Not on file  Occupational History   Occupation: retired Runner, broadcasting/film/video  Tobacco Use   Smoking status: Never   Smokeless tobacco: Never  Substance and Sexual Activity   Alcohol use: No   Drug use: No   Sexual activity: Not on file  Other Topics Concern   Not on file  Social History Narrative   Not on file   Social Determinants of Health   Financial Resource Strain: Not on file  Food Insecurity: No Food Insecurity (10/23/2022)   Hunger Vital Sign    Worried About Running Out of Food in the Last Year: Never true    Ran Out of Food in the Last Year: Never true  Transportation Needs: No Transportation Needs (10/23/2022)   PRAPARE - Administrator, Civil Service  (Medical): No    Lack of Transportation (Non-Medical): No  Physical Activity: Not on file  Stress: Not on file  Social Connections: Not on file     Family History: The patient's family history includes Aortic stenosis in her brother; Breast cancer (age of onset: 15) in her sister; Cancer in her sister; Kidney failure in her brother; Pneumonia in her father; Stroke in her mother.  ROS:   Please see the history of present illness.    All other systems reviewed and are negative.  EKGs/Labs/Other Studies Reviewed:    The following studies were reviewed today:  Cardiac Studies & Procedures   CARDIAC  CATHETERIZATION  CARDIAC CATHETERIZATION 09/21/2022  Narrative   Prox RCA lesion is 40% stenosed.   Mid RCA lesion is 35% stenosed.   Prox LAD to Mid LAD lesion is 30% stenosed.   LV end diastolic pressure is normal.   There is severe aortic valve stenosis.  Nonobstructive CAD Normal LV filling pressures. LVEDP 13 mm Hg, PCWP 16/7 with mean 8 mm Hg Normal right heart pressures. PAP 28/3 mean 13 mm Hg Normal cardiac output 4.51 L/min, index 2.91 Severe aortic stenosis. Mean gradient 32.5 mm Hg. AVA 0.84 cm squared with index 0.54  Plan: refer to valvular heart team for consideration of TAVR  Findings Coronary Findings Diagnostic  Dominance: Right  Left Anterior Descending Prox LAD to Mid LAD lesion is 30% stenosed.  Left Circumflex  First Obtuse Marginal Branch Vessel is large in size.  Right Coronary Artery Prox RCA lesion is 40% stenosed. Mid RCA lesion is 35% stenosed.  Intervention  No interventions have been documented.     ECHOCARDIOGRAM  ECHOCARDIOGRAM COMPLETE 12/05/2022  Narrative ECHOCARDIOGRAM REPORT    Patient Name:   Margaret Lucas Date of Exam: 12/05/2022 Medical Rec #:  [id number]       Height:       63.0 in Accession #:    [id number]      Weight:       114.2 lb Date of Birth:  May 05, 1934      BSA:          1.524 m Patient Age:    87 years         BP:           130/60 mmHg Patient Gender: F               HR:           74 bpm. Exam Location:  [address]  Procedure: 2D Echo, Cardiac Doppler, Color Doppler, 3D Echo and Strain Analysis  Indications:    s/p TAVR Z95.2  History:        Patient has prior history of Echocardiogram examinations, most recent 10/24/2022. Risk Factors:Hypertension and Diabetes. Aortic Valve: 23 mm Edwards Sapien prosthetic, stented (TAVR) valve is present in the aortic position. Procedure Date: 10/23/2022.  Sonographer:    Thurman Coyer RDCS Referring Phys: [id number] KATHRYN R THOMPSON  IMPRESSIONS   1. Mild intracavitary gradient. Peak velocity 0.96 m/s. Peak gradient 3.7 mmHg. Left ventricular ejection fraction, by estimation, is 60 to 65%. Left ventricular ejection fraction by 3D volume is 64 %. The left ventricle has normal function. The left ventricle has no regional wall motion abnormalities. Left ventricular diastolic parameters are consistent with Grade I diastolic dysfunction (impaired relaxation). The average left ventricular global longitudinal strain is -18.1 %. The global longitudinal strain is normal. 2. Right ventricular systolic function is normal. The right ventricular size is normal. There is normal pulmonary artery systolic pressure. 3. The mitral valve is normal in structure. Trivial mitral valve regurgitation. No evidence of mitral stenosis. 4. TAVR mean gradient 15 mmHg, increased from 11 mmHg 10/2022. The aortic valve has been repaired/replaced. Aortic valve regurgitation is not visualized. No aortic stenosis is present. There is a 23 mm Edwards Sapien prosthetic (TAVR) valve present in the aortic position. Procedure Date: 10/23/2022. Echo findings are consistent with normal structure and function of the aortic valve prosthesis. Aortic valve area, by VTI measures 1.90 cm. Aortic valve mean gradient measures 15.0 mmHg. Aortic valve Vmax measures 2.54 m/s. 5. The inferior vena  cava is  normal in size with greater than 50% respiratory variability, suggesting right atrial pressure of 3 mmHg.  FINDINGS Left Ventricle: Mild intracavitary gradient. Peak velocity 0.96 m/s. Peak gradient 3.7 mmHg. Left ventricular ejection fraction, by estimation, is 60 to 65%. Left ventricular ejection fraction by 3D volume is 64 %. The left ventricle has normal function. The left ventricle has no regional wall motion abnormalities. The average left ventricular global longitudinal strain is -18.1 %. The global longitudinal strain is normal. The left ventricular internal cavity size was normal in size. There is no left ventricular hypertrophy. Left ventricular diastolic parameters are consistent with Grade I diastolic dysfunction (impaired relaxation). Indeterminate filling pressures.  Right Ventricle: The right ventricular size is normal. No increase in right ventricular wall thickness. Right ventricular systolic function is normal. There is normal pulmonary artery systolic pressure. The tricuspid regurgitant velocity is 2.16 m/s, and with an assumed right atrial pressure of 3 mmHg, the estimated right ventricular systolic pressure is 21.7 mmHg.  Left Atrium: Left atrial size was normal in size.  Right Atrium: Right atrial size was normal in size.  Pericardium: There is no evidence of pericardial effusion.  Mitral Valve: The mitral valve is normal in structure. Mild mitral annular calcification. Trivial mitral valve regurgitation. No evidence of mitral valve stenosis.  Tricuspid Valve: The tricuspid valve is normal in structure. Tricuspid valve regurgitation is trivial. No evidence of tricuspid stenosis.  Aortic Valve: TAVR mean gradient 15 mmHg, increased from 11 mmHg 10/2022. The aortic valve has been repaired/replaced. Aortic valve regurgitation is not visualized. No aortic stenosis is present. Aortic valve mean gradient measures 15.0 mmHg. Aortic valve peak gradient measures 25.8 mmHg. Aortic  valve area, by VTI measures 1.90 cm. There is a 23 mm Edwards Sapien prosthetic, stented (TAVR) valve present in the aortic position. Procedure Date: 10/23/2022. Echo findings are consistent with normal structure and function of the aortic valve prosthesis.  Pulmonic Valve: The pulmonic valve was normal in structure. Pulmonic valve regurgitation is not visualized. No evidence of pulmonic stenosis.  Aorta: The aortic root is normal in size and structure.  Venous: The inferior vena cava is normal in size with greater than 50% respiratory variability, suggesting right atrial pressure of 3 mmHg.  IAS/Shunts: No atrial level shunt detected by color flow Doppler.   LEFT VENTRICLE PLAX 2D LVIDd:         4.30 cm         Diastology LVIDs:         2.40 cm         LV e' medial:    6.42 cm/s LV PW:         0.90 cm         LV E/e' medial:  14.1 LV IVS:        0.80 cm         LV e' lateral:   7.94 cm/s LVOT diam:     2.20 cm         LV E/e' lateral: 11.4 LV SV:         104 LV SV Index:   68              2D LVOT Area:     3.80 cm        Longitudinal Strain 2D Strain GLS  -19.4 % (A2C): 2D Strain GLS  -17.7 % (A3C): 2D Strain GLS  -17.4 % (A4C): 2D Strain GLS  -18.1 % Avg:  3D Volume  EF LV 3D EF:    Left ventricul ar ejection fraction by 3D volume is 64 %.  3D Volume EF: 3D EF:        64 % LV EDV:       87 ml LV ESV:       31 ml LV SV:        56 ml  RIGHT VENTRICLE             IVC RV Basal diam:  3.30 cm     IVC diam: 1.40 cm RV Mid diam:    2.40 cm RV S prime:     11.90 cm/s TAPSE (M-mode): 2.3 cm  LEFT ATRIUM             Index        RIGHT ATRIUM           Index LA diam:        3.50 cm 2.30 cm/m   RA Area:     12.60 cm LA Vol (A2C):   44.8 ml 29.40 ml/m  RA Volume:   31.30 ml  20.54 ml/m LA Vol (A4C):   26.0 ml 17.06 ml/m LA Biplane Vol: 34.4 ml 22.57 ml/m AORTIC VALVE AV Area (Vmax):    2.08 cm AV Area (Vmean):   2.09 cm AV Area (VTI):     1.90 cm AV Vmax:            254.00 cm/s AV Vmean:          171.000 cm/s AV VTI:            0.546 m AV Peak Grad:      25.8 mmHg AV Mean Grad:      15.0 mmHg LVOT Vmax:         139.00 cm/s LVOT Vmean:        94.000 cm/s LVOT VTI:          0.273 m LVOT/AV VTI ratio: 0.50  AORTA Ao Root diam: 2.80 cm Ao Asc diam:  3.60 cm  MITRAL VALVE                TRICUSPID VALVE MV Area (PHT): 2.99 cm     TR Peak grad:   18.7 mmHg MV Decel Time: 254 msec     TR Vmax:        216.00 cm/s MV E velocity: 90.30 cm/s MV A velocity: 115.00 cm/s  SHUNTS MV E/A ratio:  0.79         Systemic VTI:  0.27 m Systemic Diam: 2.20 cm  Chilton Si MD Electronically signed by Chilton Si MD Signature Date/Time: 12/05/2022/11:19:33 AM    Final    MONITORS  LONG TERM MONITOR-LIVE TELEMETRY (3-14 DAYS) 11/14/2022  Narrative   Normal sinus rhythm   One 8 beat run NSVT   Multiple brief runs of supraventricular tachycardia mostly PAT   Occ PACs and rare PVCs   Patch Wear Time:  13 days and 21 hours (2024-07-10T10:28:29-0400 to 2024-07-24T07:53:36-0400)  Patient had a min HR of 43 bpm, max HR of 245 bpm, and avg HR of 67 bpm. Predominant underlying rhythm was Sinus Rhythm. Bundle Branch Block/IVCD was present. 1 run of Ventricular Tachycardia occurred lasting 8 beats with a max rate of 245 bpm (avg 207 bpm). 291 Supraventricular Tachycardia runs occurred, the run with the fastest interval lasting 8 beats with a max rate of 194 bpm, the longest lasting 13.7 secs with an avg rate of 95 bpm. Isolated  SVEs were occasional (2.5%, 33256), SVE Couplets were rare (<1.0%, 2337), and SVE Triplets were rare (<1.0%, 217). Isolated VEs were rare (<1.0%), VE Couplets were rare (<1.0%), and no VE Triplets were present. Ventricular Trigeminy was present. Difficulty discerning atrial activity making definitive diagnosis difficult to ascertain.   CT SCANS  CT CORONARY MORPH W/CTA COR W/SCORE 10/02/2022  Addendum 10/02/2022  5:46  PM ADDENDUM REPORT: 10/02/2022 17:44  CLINICAL DATA:  Severe Aortic Stenosis.  EXAM: Cardiac TAVR CT  TECHNIQUE: A non-contrast, gated CT scan was obtained with axial slices of 3 mm through the heart for aortic valve calcium scoring. A 120 kV retrospective, gated, contrast cardiac scan was obtained. Gantry rotation speed was 250 msecs and collimation was 0.6 mm. Nitroglycerin was not given. The 3D data set was reconstructed in 5% intervals of the 0-95% of the R-R cycle. Systolic and diastolic phases were analyzed on a dedicated workstation using MPR, MIP, and VRT modes. The patient received 100 cc of contrast.  FINDINGS: Image quality: Excellent.  Noise artifact is: Limited.  Valve Morphology: Tricuspid aortic valve with diffuse severe thickening and severe calcifications. Restricted leaflet movement in systole.  Aortic Valve Calcium score: 1120  Aortic annular dimension:  Phase assessed: 35%  Annular area: 407 mm2  Annular perimeter: 72.4 mm  Max diameter: 24.9 mm  Min diameter: 20.7 mm  Annular and subannular calcification: None.  Membranous septum length: 9.5 mm  Optimal coplanar projection: LAO 17 CAU 6  Coronary Artery Height above Annulus:  Left Main: 11.5 mm  Right Coronary: 15.1 mm  Sinus of Valsalva Measurements:  Non-coronary: 28.5 mm  Right-coronary: 26.0 mm  Left-coronary: 27.3 mm  Sinus of Valsalva Height:  Non-coronary: 20.5 mm  Right-coronary: 20.3 mm  Left-coronary: 16.9 mm  Sinotubular Junction: 23 mm  Ascending Thoracic Aorta: 31 mm  Coronary Arteries: Normal coronary origin. Right dominance. The study was performed without use of NTG and is insufficient for plaque evaluation. Please refer to recent cardiac catheterization for coronary assessment.  Cardiac Morphology:  Right Atrium: Right atrial size is within normal limits.  Right Ventricle: The right ventricular cavity is within normal limits.  Left Atrium: Left  atrial size is normal in size with no left atrial appendage filling defect.  Left Ventricle: The ventricular cavity size is within normal limits.  Pulmonary arteries: Normal in size without proximal filling defect.  Pulmonary veins: Normal pulmonary venous drainage.  Pericardium: Normal thickness with no significant effusion or calcium present.  Mitral Valve: The mitral valve is normal structure without significant calcification.  Extra-cardiac findings: See attached radiology report for non-cardiac structures.  IMPRESSION: 1. Annular measurements support a 23 mm S3. Perimeter is in between 26/29 mm Evolut Pro. Sinuses does not support a 29 mm device, and are borderline for a 26 mm device.  2. No significant annular or subannular calcifications.  3. Sufficient coronary to annulus distance.  4. Optimal Fluoroscopic Angle for Delivery: LAO 17 CAU 6  [place] T. Flora Lipps, MD   Electronically Signed By: Lennie Odor M.D. On: 10/02/2022 17:44  Narrative EXAM: OVER-READ INTERPRETATION  CT CHEST  The following report is a limited chest CT over-read performed by radiologist Dr. Allegra Lai of [facility] on 10/02/2022. This over-read does not include interpretation of cardiac or coronary anatomy or pathology. The cardiac TAVR interpretation by the cardiologist is attached.  COMPARISON:  None Available.  FINDINGS: Extracardiac findings will be described separately under dictation for contemporaneously obtained CTA chest, abdomen and pelvis.  IMPRESSION: Please see separate dictation for contemporaneously obtained CTA chest, abdomen and pelvis dated 10/02/2022 for full description of relevant extracardiac findings.  Electronically Signed: By: Allegra Lai M.D. On: 10/02/2022 12:22          EKG:  EKG is not ordered today.    Recent Labs: 09/17/2022: TSH 2.310 10/22/2022: ALT 19 10/23/2022: Hemoglobin 10.6; Magnesium 1.8; Platelets 184 11/09/2022:  BUN 11; Creatinine, Ser 0.92; Potassium 4.7; Sodium 137   Recent Lipid Panel No results found for: "CHOL", "TRIG", "HDL", "CHOLHDL", "VLDL", "LDLCALC", "LDLDIRECT"  Physical Exam:    VS:  BP 130/68   Pulse 76   Ht 5\' 3"  (1.6 m)   Wt 114 lb 6.4 oz (51.9 kg)   SpO2 100%   BMI 20.27 kg/m     Wt Readings from Last 3 Encounters:  12/05/22 114 lb 6.4 oz (51.9 kg)  11/09/22 114 lb 3.2 oz (51.8 kg)  10/31/22 120 lb (54.4 kg)    General: Well developed, well nourished, NAD Lungs:Clear to ausculation bilaterally. No wheezes, rales, or rhonchi. Breathing is unlabored. Cardiovascular: RRR with S1 S2. No murmurs Extremities: No edema. Neuro: Alert and oriented. No focal deficits. No facial asymmetry. MAE spontaneously. Psych: Responds to questions appropriately with normal affect.    ASSESSMENT/PLAN:    Severe AS s/p TAVR: Doing well with NYHA class I symptoms s/p TAVR. Echo today with stable valve function with a mean gradient at (increased from ) and AVA at 1.90cm2. Continue ASA monotherapy and lifelong dental SBE with amoxicillin. Plan one year follow up with echo and follow up with Dr. Swaziland in the interm.   Upper thigh acing: Obtain BMET today to evaluate K+. Not currently taking statin medication. Patient feels this is secondary to increased activity.   HTN: Stable with no changes needed today.    RBBB: Stable. No high risk alerts.    Elevated Raise Score: Raise Score of 3 (RBBB and age) and meets criteria for amyloid screening. Amyloid labs obtained during admission and were unremarkable. Pt initially declined PYP scanning, but has read more about it and now wants to proceed. This was performed today, 12/05/22.    Pulmonary nodule: Pre TAVR CTs showed "new right lower lobe solid nodule measuring 7 mm. Non-contrast chest CT at 6-12 months is recommended. If the nodule is stable at time of repeat CT, then future CT at 18-24 months (from today's scan) is considered optional  for low-risk patients, but is recommended for high-risk patients." Ordered today and will follow results.    LE edema: Improved on exam today. Continue Lasix 20mg . BMET today.     Medication Adjustments/Labs and Tests Ordered: Current medicines are reviewed at length with the patient today.  Concerns regarding medicines are outlined above.  Orders Placed This Encounter  Procedures   CT CHEST WO CONTRAST   Basic Metabolic Panel (BMET)   No orders of the defined types were placed in this encounter.   Patient Instructions  Medication Instructions:   Your physician recommends that you continue on your current medications as directed. Please refer to the Current Medication list given to you today.   *If you need a refill on your cardiac medications before your next appointment, please call your pharmacy*   Lab Work:  BMET TODAY    If you have labs (blood work) drawn today and your tests are completely normal, you will receive your results only by: MyChart Message (if you have MyChart) OR A paper copy in the  mail If you have any lab test that is abnormal or we need to change your treatment, we will call you to review the results.   Testing/Procedures:  Non-Cardiac CT scanning, (CAT scanning), is a noninvasive, special x-ray that produces cross-sectional images of the body using x-rays and a computer. CT scans help physicians diagnose and treat medical conditions. For some CT exams, a contrast material is used to enhance visibility in the area of the body being studied. CT scans provide greater clarity and reveal more details than regular x-ray exams.    Follow-Up: At [facility], you and your health needs are our priority.  As part of our continuing mission to provide you with exceptional heart care, we have created designated Provider Care Teams.  These Care Teams include your primary Cardiologist (physician) and Advanced Practice Providers (APPs -  Physician Assistants and  Nurse Practitioners) who all work together to provide you with the care you need, when you need it.  We recommend signing up for the patient portal called "MyChart".  Sign up information is provided on this After Visit Summary.  MyChart is used to connect with patients for Virtual Visits (Telemedicine).  Patients are able to view lab/test results, encounter notes, upcoming appointments, etc.  Non-urgent messages can be sent to your provider as well.   To learn more about what you can do with MyChart, go to [email or web address].    Your next appointment:  NEXT AVAILABLE   Provider:   Peter Swaziland, MD     Other Instructions    Signed, Georgie Chard, NP  12/05/2022 2:42 PM    [facility]

## 2022-12-05 ENCOUNTER — Ambulatory Visit: Payer: Medicare PPO

## 2022-12-05 ENCOUNTER — Ambulatory Visit (HOSPITAL_BASED_OUTPATIENT_CLINIC_OR_DEPARTMENT_OTHER): Payer: Medicare PPO

## 2022-12-05 ENCOUNTER — Ambulatory Visit (HOSPITAL_COMMUNITY): Payer: Medicare PPO | Attending: Physician Assistant | Admitting: Cardiology

## 2022-12-05 VITALS — BP 130/68 | HR 76 | Ht 63.0 in | Wt 114.4 lb

## 2022-12-05 DIAGNOSIS — E854 Organ-limited amyloidosis: Secondary | ICD-10-CM

## 2022-12-05 DIAGNOSIS — I350 Nonrheumatic aortic (valve) stenosis: Secondary | ICD-10-CM | POA: Insufficient documentation

## 2022-12-05 DIAGNOSIS — Z952 Presence of prosthetic heart valve: Secondary | ICD-10-CM

## 2022-12-05 DIAGNOSIS — Z79899 Other long term (current) drug therapy: Secondary | ICD-10-CM | POA: Insufficient documentation

## 2022-12-05 DIAGNOSIS — I5030 Unspecified diastolic (congestive) heart failure: Secondary | ICD-10-CM | POA: Diagnosis not present

## 2022-12-05 DIAGNOSIS — I3481 Nonrheumatic mitral (valve) annulus calcification: Secondary | ICD-10-CM

## 2022-12-05 DIAGNOSIS — I10 Essential (primary) hypertension: Secondary | ICD-10-CM | POA: Insufficient documentation

## 2022-12-05 DIAGNOSIS — I4510 Unspecified right bundle-branch block: Secondary | ICD-10-CM | POA: Insufficient documentation

## 2022-12-05 DIAGNOSIS — R911 Solitary pulmonary nodule: Secondary | ICD-10-CM | POA: Diagnosis not present

## 2022-12-05 DIAGNOSIS — I43 Cardiomyopathy in diseases classified elsewhere: Secondary | ICD-10-CM

## 2022-12-05 DIAGNOSIS — R600 Localized edema: Secondary | ICD-10-CM | POA: Insufficient documentation

## 2022-12-05 LAB — ECHOCARDIOGRAM COMPLETE
AR max vel: 2.08 cm2
AV Area VTI: 1.9 cm2
AV Area mean vel: 2.09 cm2
AV Mean grad: 15 mmHg
AV Peak grad: 25.8 mmHg
Ao pk vel: 2.54 m/s
Area-P 1/2: 2.99 cm2
S' Lateral: 2.4 cm

## 2022-12-05 LAB — MYOCARDIAL AMYLOID PLANAR & SPECT: H/CL Ratio: 1.2

## 2022-12-05 MED ORDER — TECHNETIUM TC 99M PYROPHOSPHATE
21.9000 | Freq: Once | INTRAVENOUS | Status: AC
Start: 2022-12-05 — End: 2022-12-05
  Administered 2022-12-05: 21.9 via INTRAVENOUS

## 2022-12-05 NOTE — Patient Instructions (Signed)
Medication Instructions:   Your physician recommends that you continue on your current medications as directed. Please refer to the Current Medication list given to you today.   *If you need a refill on your cardiac medications before your next appointment, please call your pharmacy*   Lab Work:  BMET TODAY    If you have labs (blood work) drawn today and your tests are completely normal, you will receive your results only by: MyChart Message (if you have MyChart) OR A paper copy in the mail If you have any lab test that is abnormal or we need to change your treatment, we will call you to review the results.   Testing/Procedures:  Non-Cardiac CT scanning, (CAT scanning), is a noninvasive, special x-ray that produces cross-sectional images of the body using x-rays and a computer. CT scans help physicians diagnose and treat medical conditions. For some CT exams, a contrast material is used to enhance visibility in the area of the body being studied. CT scans provide greater clarity and reveal more details than regular x-ray exams.    Follow-Up: At [facility], you and your health needs are our priority.  As part of our continuing mission to provide you with exceptional heart care, we have created designated Provider Care Teams.  These Care Teams include your primary Cardiologist (physician) and Advanced Practice Providers (APPs -  Physician Assistants and Nurse Practitioners) who all work together to provide you with the care you need, when you need it.  We recommend signing up for the patient portal called "MyChart".  Sign up information is provided on this After Visit Summary.  MyChart is used to connect with patients for Virtual Visits (Telemedicine).  Patients are able to view lab/test results, encounter notes, upcoming appointments, etc.  Non-urgent messages can be sent to your provider as well.   To learn more about what you can do with MyChart, go to [email or web address].     Your next appointment:  NEXT AVAILABLE   Provider:   Peter [place], MD     Other Instructions

## 2022-12-06 LAB — BASIC METABOLIC PANEL
BUN/Creatinine Ratio: 13 (ref 12–28)
BUN: 12 mg/dL (ref 8–27)
CO2: 22 mmol/L (ref 20–29)
Calcium: 9.3 mg/dL (ref 8.7–10.3)
Chloride: 96 mmol/L (ref 96–106)
Creatinine, Ser: 0.91 mg/dL (ref 0.57–1.00)
Glucose: 133 mg/dL — ABNORMAL HIGH (ref 70–99)
Potassium: 4.4 mmol/L (ref 3.5–5.2)
Sodium: 135 mmol/L (ref 134–144)
eGFR: 62 mL/min/{1.73_m2} (ref >59)

## 2022-12-10 ENCOUNTER — Telehealth: Payer: Self-pay | Admitting: Cardiology

## 2022-12-10 DIAGNOSIS — R911 Solitary pulmonary nodule: Secondary | ICD-10-CM

## 2022-12-10 NOTE — Telephone Encounter (Signed)
Spoke to patient she stated she had a TAVR in July and a new lung nodule was seen.Stated she was told to have a chest ct in 6 months.Stated she has a chest ct scheduled 9/10.She wanted to ask Dr.Jordan if that was too soon.

## 2022-12-10 NOTE — Telephone Encounter (Signed)
New Message:      Patiemt would like for the nurse to please call. She says she has some questions about her CT please.

## 2022-12-11 ENCOUNTER — Ambulatory Visit: Payer: Medicare PPO | Admitting: Cardiology

## 2022-12-12 NOTE — Telephone Encounter (Signed)
Spoke to patient Dr.Jordan advised to have chest ct in 6 months to follow up on new lung nodule.Scheduler will call back with appointment.

## 2022-12-25 ENCOUNTER — Ambulatory Visit (HOSPITAL_COMMUNITY): Payer: Medicare PPO

## 2022-12-26 DIAGNOSIS — E1122 Type 2 diabetes mellitus with diabetic chronic kidney disease: Secondary | ICD-10-CM | POA: Diagnosis not present

## 2022-12-26 DIAGNOSIS — Z8719 Personal history of other diseases of the digestive system: Secondary | ICD-10-CM | POA: Diagnosis not present

## 2022-12-26 DIAGNOSIS — E785 Hyperlipidemia, unspecified: Secondary | ICD-10-CM | POA: Diagnosis not present

## 2022-12-26 DIAGNOSIS — E039 Hypothyroidism, unspecified: Secondary | ICD-10-CM | POA: Diagnosis not present

## 2022-12-26 DIAGNOSIS — I129 Hypertensive chronic kidney disease with stage 1 through stage 4 chronic kidney disease, or unspecified chronic kidney disease: Secondary | ICD-10-CM | POA: Diagnosis not present

## 2022-12-31 ENCOUNTER — Telehealth: Payer: Self-pay | Admitting: Internal Medicine

## 2022-12-31 NOTE — Telephone Encounter (Signed)
Vernella, Student - 12/31/2022  9:31 AM Janetta Hora, PA-C  Sent: Mon December 31, 2022  9:54 AM  To: Loa Socks, LPN; Lendon Ka, RN; Chelsea Aus, RN         Message  I will reach out to the valve company to make sure we get her a card. Also I will send a note to the cardiac rehab team to make sure they have her on a list to call. Carlette was added to this convo    KT    Contacted the pt and endorsed the above information.  Pt verbalized understanding and agrees with this plan.  Pt was more than gracious for all the assistance provided.

## 2022-12-31 NOTE — Telephone Encounter (Signed)
Patient states after 7/09 valve replacement she was advised that she would receive a card, but she has not received anything. Patient also states she lost a phone number for her to begin physical therapy.

## 2023-01-02 DIAGNOSIS — E1122 Type 2 diabetes mellitus with diabetic chronic kidney disease: Secondary | ICD-10-CM | POA: Diagnosis not present

## 2023-01-02 DIAGNOSIS — I129 Hypertensive chronic kidney disease with stage 1 through stage 4 chronic kidney disease, or unspecified chronic kidney disease: Secondary | ICD-10-CM | POA: Diagnosis not present

## 2023-01-02 DIAGNOSIS — Z0000 Encounter for general adult medical examination without abnormal findings: Secondary | ICD-10-CM | POA: Diagnosis not present

## 2023-01-02 DIAGNOSIS — K219 Gastro-esophageal reflux disease without esophagitis: Secondary | ICD-10-CM | POA: Diagnosis not present

## 2023-01-11 DIAGNOSIS — H353231 Exudative age-related macular degeneration, bilateral, with active choroidal neovascularization: Secondary | ICD-10-CM | POA: Diagnosis not present

## 2023-01-14 DIAGNOSIS — C44319 Basal cell carcinoma of skin of other parts of face: Secondary | ICD-10-CM | POA: Diagnosis not present

## 2023-01-14 DIAGNOSIS — C4441 Basal cell carcinoma of skin of scalp and neck: Secondary | ICD-10-CM | POA: Diagnosis not present

## 2023-01-19 NOTE — Progress Notes (Signed)
Cardiology Office Note   Date:  01/25/2023   ID:  ELGIE Lucas, DOB July 24, 1934, MRN [id number]  PCP:  Irena Reichmann, DO  Cardiologist:   Dejanee Thibeaux [place], MD   Chief Complaint  Patient presents with   Follow-up   aortic valve disease      History of Present Illness: Margaret Lucas is a 88 y.o. female who is seen for follow up of aortic stenosis.  She has a history of HTN and DM- diet controlled. She mentions she has a murmur dating back several  years. She had an Echo in 2014 showing some AV sclerosis. Repeat in 2017 showed mild AS but valve velocity was really unchanged. Echo in Jan 2022 showed mild AS without change.   She was admitted in November 2021 with N/V, dehydration, UTI and hyponatremia. HCT held. Follow up sodium level improved.    In May she  developed exertional fatigue, shortness of breath, LE edema as well as chest tightness and dizziness. Repeat echocardiogram on 08/30/2022 showed an EF 60% and moderate AS with a mean grad 30 mmHg, peak grad 51.3 mmHg, DVI 0.98, SVI 61, as well a mild MR & AI. Cardiac catheterization showed nonobstructive coronary disease with a mean gradient across aortic valve of 32.5 mmHg with a valve area of 0.84 cm.    The patient was evaluated by the multidisciplinary valve team and underwent successful TAVR with a 23 mm Edwards Sapien 3 Ultra Resilia 23 THV via the TF approach on 10/23/22. Post operative echo showed EF 65%, normally functioning TAVR with a mean gradient of 11 mmHg and no PVL. She was continued on home aspirin. Post procedure she developed some edema that improved with lasix. She was noted to have a 7 mm right lung nodule and repeat CT recommended. She did have a PET scan for amyloid screening and this was low risk.   On follow up today she is doing very well. Has resumed all her normal activity. No dyspnea, chest pain or palpitations. Edema has resolved. Main complaint is of vision loss from macular degeneration.    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 Lynnette Caffey & Bartle   Severe aortic stenosis    Vertigo     Past Surgical History:  Procedure Laterality Date   CHOLECYSTECTOMY     COLONOSCOPY  2010 last   ESOPHAGOGASTRODUODENOSCOPY N/A 08/08/2016   Procedure: ESOPHAGOGASTRODUODENOSCOPY (EGD);  Surgeon: Iva Boop, MD;  Location: [facility] ENDOSCOPY;  Service: Endoscopy;  Laterality: N/A;   INTRAOPERATIVE TRANSTHORACIC ECHOCARDIOGRAM N/A 10/23/2022   Procedure: INTRAOPERATIVE TRANSTHORACIC ECHOCARDIOGRAM;  Surgeon: Orbie Pyo, MD;  Location: [facility] INVASIVE CV LAB;  Service: Open Heart Surgery;  Laterality: N/A;   RIGHT/LEFT HEART CATH AND CORONARY ANGIOGRAPHY N/A 09/21/2022   Procedure: RIGHT/LEFT HEART CATH AND CORONARY ANGIOGRAPHY;  Surgeon: [place], Sahaana Weitman M, MD;  Location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    TRANSCATHETER AORTIC VALVE REPLACEMENT, TRANSFEMORAL N/A 10/23/2022   Procedure: Transcatheter Aortic Valve Replacement, Transfemoral;  Surgeon: Orbie Pyo, MD;  Location: [facility] INVASIVE CV LAB;  Service: Open Heart Surgery;  Laterality: N/A;   TUBAL LIGATION       Current Outpatient Medications  Medication Sig Dispense Refill   amLODipine (NORVASC) 10 MG tablet Take 10 mg by mouth at bedtime.     amoxicillin (AMOXIL) 500 MG tablet Take 4 tablets (2,000 mg total) by mouth as directed. 1 hour prior to dental work including cleanings 12 tablet 12   aspirin EC 81 MG tablet Take 1 tablet (81 mg total) by mouth daily. Swallow whole. (Patient taking differently: Take 81 mg by mouth at bedtime. Swallow whole.) 32 tablet 0   Cholecalciferol (VITAMIN  D) 125 MCG (5000 UT) CAPS Take 5,000 Units by mouth at bedtime.     fexofenadine (ALLEGRA) 180 MG tablet Take 180 mg by mouth daily as needed for allergies.     fluticasone (FLONASE) 50 MCG/ACT nasal spray Place 1-2 sprays into both nostrils daily as needed for allergies.     furosemide (LASIX) 20 MG tablet Take 1 tablet (20 mg total) by mouth daily. 30 tablet 6   levothyroxine (SYNTHROID, LEVOTHROID) 100 MCG tablet Take 100 mcg by mouth daily before breakfast.     lisinopril (ZESTRIL) 40 MG tablet Take 1 tablet (40 mg total) by mouth daily. 90 tablet 3   meclizine (ANTIVERT) 25 MG tablet Take 1 tablet (25 mg total) by mouth 3 (three) times daily as needed for dizziness. 30 tablet 0   Multiple Vitamins-Minerals (PRESERVISION AREDS 2 PO) Take 1 tablet by mouth in the morning and at bedtime.     pantoprazole (PROTONIX) 40 MG tablet Take 1 tablet (40 mg total) by mouth 2 (two) times daily. (Patient taking differently: Take 40 mg by mouth daily in the afternoon.) 60 tablet 0   rosuvastatin (CRESTOR) 5 MG tablet Take 5 mg by mouth daily.     No current facility-administered medications for this visit.    Allergies:   Chlorthalidone and Demerol [meperidine]    Social History:  The patient  reports that she has never smoked. She has never used smokeless tobacco. She reports that she does not drink alcohol and does not use drugs.   Family History:  The patient's family history includes Aortic stenosis in her brother; Breast cancer (age of onset: 94) in her sister; Cancer in her sister; Kidney failure in her brother; Pneumonia in her father; Stroke in her mother.    ROS:  Please see the history of present illness.   Otherwise, review of systems are positive for none.   All other systems are reviewed and negative.    PHYSICAL EXAM: VS:  BP 126/60 (BP Location: Left Arm, Patient Position: Sitting, Cuff Size: Normal)   Pulse 80   Ht 5\' 3"  (1.6 m)   Wt 114 lb (51.7 kg)   BMI 20.19 kg/m  , BMI Body  mass index is 20.19 kg/m. GENERAL:  Well appearing WF in NAD HEENT:  PERRL, EOMI, sclera are clear. Oropharynx is clear. NECK:  No jugular venous distention, carotid upstroke brisk and symmetric, no bruits, no thyromegaly or adenopathy LUNGS:  Clear to auscultation bilaterally CHEST:  Unremarkable HEART:  RRR,  PMI not displaced or sustained, grade soft 1-2/6 systolic murmur RUSB. S1 and S2 within normal limits, no S3, no S4: no clicks, no rubs ABD:  Soft, nontender. BS +, no masses or bruits. No hepatomegaly, no splenomegaly EXT:  2 + pulses throughout, no edema, no cyanosis no clubbing SKIN:  Warm and dry.  No rashes NEURO:  Alert and oriented x 3. Cranial  nerves II through XII intact. PSYCH:  Cognitively intact  EKG:  EKG is not ordered today.    Recent Labs: 09/17/2022: TSH 2.310 10/22/2022: ALT 19 10/23/2022: Hemoglobin 10.6; Magnesium 1.8; Platelets 184 12/05/2022: BUN 12; Creatinine, Ser 0.91; Potassium 4.4; Sodium 135    Lipid Panel No results found for: "CHOL", "TRIG", "HDL", "CHOLHDL", "VLDL", "LDLCALC", "LDLDIRECT"   Labs dated 06/03/17: cholesterol 232, triglycerides 158, HDL 47, LDL 153. A1c 6.2%. Potassium 5.5. Creatinine 0.74 Dated 05/20/18: cholesterol 218, triglycerides 121, HDL 54, LDL 140. CMET and TSH normal. Dated 12/26/22: A1c 6%. Cholesterol 221, triglycerides 124, HDL 55, LDL 144.   Wt Readings from Last 3 Encounters:  01/25/23 114 lb (51.7 kg)  12/05/22 114 lb 6.4 oz (51.9 kg)  11/09/22 114 lb 3.2 oz (51.8 kg)      Other studies Reviewed: Additional studies/ records that were reviewed today include:  Echo 02/22/16: Study Conclusions   - Left ventricle: The cavity size was normal. Wall thickness was   normal. Systolic function was normal. The estimated ejection   fraction was in the range of 60% to 65%. Wall motion was normal;   there were no regional wall motion abnormalities. Doppler   parameters are consistent with abnormal left ventricular   relaxation  (grade 1 diastolic dysfunction). - Aortic valve: There was mild stenosis. There was mild   regurgitation. - Mitral valve: Calcified annulus. There was mild regurgitation  Echo 05/10/20: IMPRESSIONS     1. Left ventricular ejection fraction, by estimation, is 60 to 65%. The  left ventricle has normal function. The left ventricle has no regional  wall motion abnormalities. Left ventricular diastolic parameters are  consistent with Grade I diastolic  dysfunction (impaired relaxation).   2. Right ventricular systolic function is normal. The right ventricular  size is normal.   3. The mitral valve is grossly normal. Trivial mitral valve  regurgitation.   4. The aortic valve is tricuspid. There is moderate calcification of the  aortic valve. Aortic valve regurgitation is mild to moderate. Mild aortic  valve stenosis. Aortic valve area, by VTI measures 1.65 cm. Aortic valve  mean gradient measures 17.0  mmHg. Aortic valve Vmax measures 2.76 m/s.   Comparison(s): A prior study was performed on 02/22/2016. Similar aortic  gradients with slight increase in aortic valve calcification. Largest mean  gradient acquired 22 mm Hg.   Echo 08/29/20: IMPRESSIONS     1. Moderate aortic stenosis.   2. Left ventricular ejection fraction, by estimation, is 60 to 65%. The  left ventricle has normal function. The left ventricle has no regional  wall motion abnormalities. Left ventricular diastolic parameters are  consistent with Grade I diastolic  dysfunction (impaired relaxation).   3. Right ventricular systolic function is normal. The right ventricular  size is normal. There is normal pulmonary artery systolic pressure.   4. The mitral valve is normal in structure. Mild mitral valve  regurgitation. No evidence of mitral stenosis.   5. The aortic valve is calcified. There is moderate calcification of the  aortic valve. There is moderate thickening of the aortic valve. Aortic  valve regurgitation is  mild. Moderate aortic valve stenosis. Aortic valve  mean gradient measures 30.0 mmHg.  Aortic valve Vmax measures 3.58 m/s.   6. The inferior vena cava is normal in size with greater than 50%  respiratory variability, suggesting right atrial pressure of 3 mmHg.   Comparison(s): Prior images reviewed side by side. ECHO 01/22 aortic mean  17 and peak  gradient 30.6 mmHg.   Echo 12/05/22: IMPRESSIONS     1. Mild intracavitary gradient. Peak velocity 0.96 m/s. Peak gradient 3.7  mmHg. Left ventricular ejection fraction, by estimation, is 60 to 65%.  Left ventricular ejection fraction by 3D volume is 64 %. The left  ventricle has normal function. The left  ventricle has no regional wall motion abnormalities. Left ventricular  diastolic parameters are consistent with Grade I diastolic dysfunction  (impaired relaxation). The average left ventricular global longitudinal  strain is -18.1 %. The global  longitudinal strain is normal.   2. Right ventricular systolic function is normal. The right ventricular  size is normal. There is normal pulmonary artery systolic pressure.   3. The mitral valve is normal in structure. Trivial mitral valve  regurgitation. No evidence of mitral stenosis.   4. TAVR mean gradient 15 mmHg, increased from 11 mmHg 10/2022. The aortic  valve has been repaired/replaced. Aortic valve regurgitation is not  visualized. No aortic stenosis is present. There is a 23 mm Edwards Sapien  prosthetic (TAVR) valve present in  the aortic position. Procedure Date: 10/23/2022. Echo findings are  consistent with normal structure and function of the aortic valve  prosthesis. Aortic valve area, by VTI measures 1.90 cm. Aortic valve mean  gradient measures 15.0 mmHg. Aortic valve Vmax  measures 2.54 m/s.   5. The inferior vena cava is normal in size with greater than 50%  respiratory variability, suggesting right atrial pressure of 3 mmHg.   ASSESSMENT AND PLAN:  1.  Aortic stenosis  now s/p TAVR. Excellent recovery. 2. RBBB. Chronic. 3. HTN. Well controlled.  4. CAD nonobstructive on cardiac cath in June 5. Hypercholesterolemia. Managed with diet.  LDL 144. Recently started on Crestor 5 mg daily.  6. Pulmonary nodule. Recommend repeat noncontrast CT in 6-12 months. Planning repeat CT Dec 30.   7. Edema resolved. Recommend using lasix as needed now.    Signed, Korine Winton [place], MD  01/25/2023 3:49 PM    [facility] [address] [place], [place], [address] Phone [phone], Fax [phone]

## 2023-01-25 ENCOUNTER — Ambulatory Visit: Payer: Medicare PPO | Attending: Cardiology | Admitting: Cardiology

## 2023-01-25 ENCOUNTER — Encounter: Payer: Self-pay | Admitting: Cardiology

## 2023-01-25 VITALS — BP 126/60 | HR 80 | Ht 63.0 in | Wt 114.0 lb

## 2023-01-25 DIAGNOSIS — Z952 Presence of prosthetic heart valve: Secondary | ICD-10-CM

## 2023-01-25 DIAGNOSIS — R600 Localized edema: Secondary | ICD-10-CM

## 2023-01-25 DIAGNOSIS — I10 Essential (primary) hypertension: Secondary | ICD-10-CM | POA: Diagnosis not present

## 2023-01-25 DIAGNOSIS — E1159 Type 2 diabetes mellitus with other circulatory complications: Secondary | ICD-10-CM | POA: Diagnosis not present

## 2023-01-25 DIAGNOSIS — I152 Hypertension secondary to endocrine disorders: Secondary | ICD-10-CM | POA: Diagnosis not present

## 2023-01-25 DIAGNOSIS — I350 Nonrheumatic aortic (valve) stenosis: Secondary | ICD-10-CM

## 2023-01-25 NOTE — Patient Instructions (Signed)
Medication Instructions:  Lasix as needed Continue all medication *If you need a refill on your cardiac medications before your next appointment, please call your pharmacy*   Lab Work: none   Testing/Procedures: none   Follow-Up: At [facility], you and your health needs are our priority.  As part of our continuing mission to provide you with exceptional heart care, we have created designated Provider Care Teams.  These Care Teams include your primary Cardiologist (physician) and Advanced Practice Providers (APPs -  Physician Assistants and Nurse Practitioners) who all work together to provide you with the care you need, when you need it.  We recommend signing up for the patient portal called "MyChart".  Sign up information is provided on this After Visit Summary.  MyChart is used to connect with patients for Virtual Visits (Telemedicine).  Patients are able to view lab/test results, encounter notes, upcoming appointments, etc.  Non-urgent messages can be sent to your provider as well.   To learn more about what you can do with MyChart, go to [email or web address].    Your next appointment:   Follow up in 6 months  call in Jan for an appointment in April   Provider:   [place]

## 2023-02-22 DIAGNOSIS — H353231 Exudative age-related macular degeneration, bilateral, with active choroidal neovascularization: Secondary | ICD-10-CM | POA: Diagnosis not present

## 2023-02-25 DIAGNOSIS — Z85828 Personal history of other malignant neoplasm of skin: Secondary | ICD-10-CM | POA: Diagnosis not present

## 2023-02-25 DIAGNOSIS — Z08 Encounter for follow-up examination after completed treatment for malignant neoplasm: Secondary | ICD-10-CM | POA: Diagnosis not present

## 2023-02-25 DIAGNOSIS — C4441 Basal cell carcinoma of skin of scalp and neck: Secondary | ICD-10-CM | POA: Diagnosis not present

## 2023-02-25 DIAGNOSIS — C44319 Basal cell carcinoma of skin of other parts of face: Secondary | ICD-10-CM | POA: Diagnosis not present

## 2023-03-10 DIAGNOSIS — H269 Unspecified cataract: Secondary | ICD-10-CM | POA: Diagnosis not present

## 2023-03-10 DIAGNOSIS — E039 Hypothyroidism, unspecified: Secondary | ICD-10-CM | POA: Diagnosis not present

## 2023-03-10 DIAGNOSIS — R32 Unspecified urinary incontinence: Secondary | ICD-10-CM | POA: Diagnosis not present

## 2023-03-10 DIAGNOSIS — M810 Age-related osteoporosis without current pathological fracture: Secondary | ICD-10-CM | POA: Diagnosis not present

## 2023-03-10 DIAGNOSIS — Z79899 Other long term (current) drug therapy: Secondary | ICD-10-CM | POA: Diagnosis not present

## 2023-03-10 DIAGNOSIS — K219 Gastro-esophageal reflux disease without esophagitis: Secondary | ICD-10-CM | POA: Diagnosis not present

## 2023-03-10 DIAGNOSIS — M1990 Unspecified osteoarthritis, unspecified site: Secondary | ICD-10-CM | POA: Diagnosis not present

## 2023-03-10 DIAGNOSIS — I2510 Atherosclerotic heart disease of native coronary artery without angina pectoris: Secondary | ICD-10-CM | POA: Diagnosis not present

## 2023-03-10 DIAGNOSIS — E785 Hyperlipidemia, unspecified: Secondary | ICD-10-CM | POA: Diagnosis not present

## 2023-03-21 ENCOUNTER — Other Ambulatory Visit: Payer: Self-pay | Admitting: Physician Assistant

## 2023-03-29 DIAGNOSIS — H353231 Exudative age-related macular degeneration, bilateral, with active choroidal neovascularization: Secondary | ICD-10-CM | POA: Diagnosis not present

## 2023-04-01 DIAGNOSIS — C4441 Basal cell carcinoma of skin of scalp and neck: Secondary | ICD-10-CM | POA: Diagnosis not present

## 2023-04-01 DIAGNOSIS — C44319 Basal cell carcinoma of skin of other parts of face: Secondary | ICD-10-CM | POA: Diagnosis not present

## 2023-04-15 ENCOUNTER — Ambulatory Visit (HOSPITAL_COMMUNITY)
Admission: RE | Admit: 2023-04-15 | Discharge: 2023-04-15 | Disposition: A | Discharge status: Home / Self Care | Payer: Medicare PPO | Source: Ambulatory Visit | Attending: Cardiology | Admitting: Cardiology

## 2023-04-15 DIAGNOSIS — R918 Other nonspecific abnormal finding of lung field: Secondary | ICD-10-CM | POA: Diagnosis not present

## 2023-04-15 DIAGNOSIS — R911 Solitary pulmonary nodule: Secondary | ICD-10-CM | POA: Diagnosis not present

## 2023-04-15 DIAGNOSIS — I700 Atherosclerosis of aorta: Secondary | ICD-10-CM | POA: Diagnosis not present

## 2023-04-19 DIAGNOSIS — C44319 Basal cell carcinoma of skin of other parts of face: Secondary | ICD-10-CM | POA: Diagnosis not present

## 2023-04-23 ENCOUNTER — Telehealth: Payer: Self-pay | Admitting: Cardiology

## 2023-04-23 NOTE — Telephone Encounter (Signed)
Pt calling to f/u on CT results. Please advise

## 2023-04-23 NOTE — Telephone Encounter (Signed)
 Called and spoke to patient. Verified name and DOB. Informed patient results were not back at this time and we would reach out to her when her results are available. No further questions at this time.

## 2023-05-10 DIAGNOSIS — H353231 Exudative age-related macular degeneration, bilateral, with active choroidal neovascularization: Secondary | ICD-10-CM | POA: Diagnosis not present

## 2023-06-11 ENCOUNTER — Other Ambulatory Visit: Payer: Self-pay

## 2023-06-11 ENCOUNTER — Inpatient Hospital Stay (HOSPITAL_COMMUNITY)
Admission: EM | Admit: 2023-06-11 | Discharge: 2023-06-13 | DRG: 243 | Disposition: A | Discharge status: Home / Self Care | Payer: Medicare PPO | Attending: Internal Medicine | Admitting: Internal Medicine

## 2023-06-11 ENCOUNTER — Encounter (HOSPITAL_COMMUNITY): Payer: Self-pay

## 2023-06-11 ENCOUNTER — Telehealth: Payer: Self-pay | Admitting: Cardiology

## 2023-06-11 ENCOUNTER — Emergency Department (HOSPITAL_COMMUNITY): Payer: Medicare PPO

## 2023-06-11 ENCOUNTER — Ambulatory Visit
Admission: EM | Admit: 2023-06-11 | Discharge: 2023-06-11 | Disposition: A | Discharge status: Other Acute Inpatient Hospital | Payer: Medicare PPO | Source: Home / Self Care | Attending: Family Medicine | Admitting: Family Medicine

## 2023-06-11 DIAGNOSIS — Z7982 Long term (current) use of aspirin: Secondary | ICD-10-CM | POA: Diagnosis not present

## 2023-06-11 DIAGNOSIS — E039 Hypothyroidism, unspecified: Secondary | ICD-10-CM | POA: Diagnosis present

## 2023-06-11 DIAGNOSIS — Z8249 Family history of ischemic heart disease and other diseases of the circulatory system: Secondary | ICD-10-CM

## 2023-06-11 DIAGNOSIS — Z841 Family history of disorders of kidney and ureter: Secondary | ICD-10-CM | POA: Diagnosis not present

## 2023-06-11 DIAGNOSIS — Z79890 Hormone replacement therapy (postmenopausal): Secondary | ICD-10-CM

## 2023-06-11 DIAGNOSIS — E7800 Pure hypercholesterolemia, unspecified: Secondary | ICD-10-CM | POA: Insufficient documentation

## 2023-06-11 DIAGNOSIS — K219 Gastro-esophageal reflux disease without esophagitis: Secondary | ICD-10-CM | POA: Diagnosis present

## 2023-06-11 DIAGNOSIS — I442 Atrioventricular block, complete: Principal | ICD-10-CM | POA: Diagnosis present

## 2023-06-11 DIAGNOSIS — Z79899 Other long term (current) drug therapy: Secondary | ICD-10-CM | POA: Diagnosis not present

## 2023-06-11 DIAGNOSIS — I350 Nonrheumatic aortic (valve) stenosis: Secondary | ICD-10-CM

## 2023-06-11 DIAGNOSIS — Z85828 Personal history of other malignant neoplasm of skin: Secondary | ICD-10-CM | POA: Diagnosis not present

## 2023-06-11 DIAGNOSIS — Z803 Family history of malignant neoplasm of breast: Secondary | ICD-10-CM | POA: Diagnosis not present

## 2023-06-11 DIAGNOSIS — R001 Bradycardia, unspecified: Secondary | ICD-10-CM | POA: Diagnosis not present

## 2023-06-11 DIAGNOSIS — Z888 Allergy status to other drugs, medicaments and biological substances status: Secondary | ICD-10-CM

## 2023-06-11 DIAGNOSIS — E871 Hypo-osmolality and hyponatremia: Secondary | ICD-10-CM | POA: Diagnosis present

## 2023-06-11 DIAGNOSIS — Z885 Allergy status to narcotic agent status: Secondary | ICD-10-CM | POA: Diagnosis not present

## 2023-06-11 DIAGNOSIS — N179 Acute kidney failure, unspecified: Secondary | ICD-10-CM | POA: Diagnosis not present

## 2023-06-11 DIAGNOSIS — Z953 Presence of xenogenic heart valve: Secondary | ICD-10-CM

## 2023-06-11 DIAGNOSIS — Z8719 Personal history of other diseases of the digestive system: Secondary | ICD-10-CM | POA: Diagnosis not present

## 2023-06-11 DIAGNOSIS — Z952 Presence of prosthetic heart valve: Secondary | ICD-10-CM

## 2023-06-11 DIAGNOSIS — E119 Type 2 diabetes mellitus without complications: Secondary | ICD-10-CM | POA: Diagnosis present

## 2023-06-11 DIAGNOSIS — M810 Age-related osteoporosis without current pathological fracture: Secondary | ICD-10-CM | POA: Diagnosis present

## 2023-06-11 DIAGNOSIS — Z9049 Acquired absence of other specified parts of digestive tract: Secondary | ICD-10-CM

## 2023-06-11 DIAGNOSIS — I10 Essential (primary) hypertension: Secondary | ICD-10-CM | POA: Diagnosis present

## 2023-06-11 DIAGNOSIS — Z823 Family history of stroke: Secondary | ICD-10-CM | POA: Diagnosis not present

## 2023-06-11 DIAGNOSIS — E785 Hyperlipidemia, unspecified: Secondary | ICD-10-CM | POA: Diagnosis present

## 2023-06-11 DIAGNOSIS — Z950 Presence of cardiac pacemaker: Secondary | ICD-10-CM | POA: Diagnosis not present

## 2023-06-11 HISTORY — DX: Acute posthemorrhagic anemia: D62

## 2023-06-11 LAB — CBC
HCT: 33.7 % — ABNORMAL LOW (ref 36.0–46.0)
Hemoglobin: 11.4 g/dL — ABNORMAL LOW (ref 12.0–15.0)
MCH: 28.9 pg (ref 26.0–34.0)
MCHC: 33.8 g/dL (ref 30.0–36.0)
MCV: 85.3 fL (ref 80.0–100.0)
Platelets: 198 10*3/uL (ref 150–400)
RBC: 3.95 MIL/uL (ref 3.87–5.11)
RDW: 12.8 % (ref 11.5–15.5)
WBC: 9.8 10*3/uL (ref 4.0–10.5)
nRBC: 0 % (ref 0.0–0.2)

## 2023-06-11 LAB — BASIC METABOLIC PANEL
Anion gap: 11 (ref 5–15)
BUN: 18 mg/dL (ref 8–23)
CO2: 22 mmol/L (ref 22–32)
Calcium: 8.9 mg/dL (ref 8.9–10.3)
Chloride: 97 mmol/L — ABNORMAL LOW (ref 98–111)
Creatinine, Ser: 0.97 mg/dL (ref 0.44–1.00)
GFR, Estimated: 57 mL/min — ABNORMAL LOW (ref >60)
Glucose, Bld: 126 mg/dL — ABNORMAL HIGH (ref 70–99)
Potassium: 4.3 mmol/L (ref 3.5–5.1)
Sodium: 130 mmol/L — ABNORMAL LOW (ref 135–145)

## 2023-06-11 LAB — TROPONIN I (HIGH SENSITIVITY)
Troponin I (High Sensitivity): 7 ng/L (ref <18)
Troponin I (High Sensitivity): 9 ng/L (ref <18)

## 2023-06-11 NOTE — ED Notes (Signed)
 Patient is being discharged from the Urgent Care and sent to the Emergency Department via POV . Per PB, patient is in need of higher level of care due to bradycardia. Patient is aware and verbalizes understanding of plan of care.  Vitals:   06/11/23 1354 06/11/23 1359  BP: 135/60   Pulse: (!) 49 (!) 49  Resp: 18   Temp: 97.9 F (36.6 C)   SpO2: 99%

## 2023-06-11 NOTE — ED Provider Triage Note (Signed)
 Emergency Medicine Provider Triage Evaluation Note  Margaret Lucas , a 89 y.o. female  was evaluated in triage.  Pt complains of low heart rate.  Patient reports that she was checking blood pressure today when she noted that her pulse was 49.  Reports that she has been experiencing weakness over the last 1 week.  Denies chest pain, shortness of breath, nausea, vomiting, lightheadedness, dizziness, syncope.  Denies history of pacemaker.  Review of Systems  Positive:  Negative:   Physical Exam  BP (!) 166/51   Pulse (!) 49   Temp (!) 97.4 F (36.3 C)   Resp 18   SpO2 95%  Gen:   Awake, no distress   Resp:  Normal effort  MSK:   Moves extremities without difficulty  Other:    Medical Decision Making  Medically screening exam initiated at 3:13 PM.  Appropriate orders placed.  Margaret Lucas was informed that the remainder of the evaluation will be completed by another provider, this initial triage assessment does not replace that evaluation, and the importance of remaining in the ED until their evaluation is complete.    Al Decant, PA-C 06/11/23 1514

## 2023-06-11 NOTE — Telephone Encounter (Signed)
 Call transferred from front office staff. Spoke with patient who states that the last couple of days her blood pressure has been fluctuating from a high of SBP in the 160-170 over mid 70s with a heart rate in the mid 80s. Today she states blood pressure is better at 136/62 but her heart rate is staying at 42 and she is very concerned as "it never goes that low". States she has been drinking fluids per her usual and doesn't feel dehydrated, have any chest pain or shortness of breath but just "not right".  Advised to go to urgent care or ED for an evaluation due to having history of severe aortic stenosis and bundle branch block on EKG. . She voiced understanding.

## 2023-06-11 NOTE — Telephone Encounter (Signed)
 STAT if HR is under 50 or over 120 (normal HR is 60-100 beats per minute)  What is your heart rate?  72 last night 42 this morning  47 now   Do you have a log of your heart rate readings (document readings)? See above   Do you have any other symptoms? Pt denies any other symptoms, please advise.

## 2023-06-11 NOTE — ED Triage Notes (Signed)
 Pt states over the last few days she has been having an elevated blood pressure with period of time where her heart rate was 142 and then 172, she went to the UC today and they did an EKG that showed she had a heart rate of 48. She is not on any beta blockers. No recent fevers, no chills, No SHOB of breath and no recent illnesses

## 2023-06-11 NOTE — ED Triage Notes (Signed)
 Heart rate was 42 at home, and 47 a while ago. "Last week my BP was a little high, no sob, no chest pain, no recent illness".

## 2023-06-11 NOTE — Discharge Instructions (Signed)
She went to the ER 

## 2023-06-11 NOTE — ED Provider Notes (Signed)
 [facility]    CSN: [id number] Arrival date & time: 06/11/23  1341      History   Chief Complaint Chief Complaint  Patient presents with   Low Heart Rate    HPI Margaret Lucas is a 89 y.o. female.   HPI Here for low heart rate.  She has been checking her blood pressure regularly lately and today noticed that her heart rate had dropped about 30 points from where it usually was in the 40s and 50s now.  Usually her heart rate is in the 70s or 80s.  No chest pain or shortness of breath and no dizziness.   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    TRANSCATHETER AORTIC VALVE REPLACEMENT, TRANSFEMORAL N/A 10/23/2022   Procedure: Transcatheter Aortic Valve Replacement, Transfemoral;  Surgeon: Orbie Pyo, MD;  Location: [facility] INVASIVE CV LAB;  Service: Open Heart Surgery;  Laterality: N/A;   TUBAL LIGATION      OB History   No obstetric history on file.      Home Medications    Prior to Admission medications   Medication Sig Start Date End Date Taking? Authorizing Provider  amLODipine (NORVASC) 10 MG tablet Take 10 mg by mouth at bedtime.   Yes [provider]  Cholecalciferol (VITAMIN D) 125 MCG (5000 UT) CAPS Take 5,000 Units by mouth at bedtime.   Yes [provider]  furosemide (LASIX) 20 MG tablet TAKE 1 TABLET BY MOUTH EVERY DAY Patient taking differently: Take 20 mg by mouth daily. Last dose: 02-24, 3x per week. 03/22/23  Yes Georgie Chard D, NP  levothyroxine (SYNTHROID, LEVOTHROID) 100 MCG tablet Take 100 mcg by mouth daily before breakfast.   Yes [provider]  lisinopril (ZESTRIL) 40 MG tablet Take 1 tablet (40 mg total) by mouth daily. 09/24/22  Yes Orbie Pyo, MD  pantoprazole (  PROTONIX) 40 MG tablet Take 1 tablet (40 mg total) by mouth 2 (two) times daily. Patient taking differently: Take 40 mg by mouth daily in the afternoon. 08/11/16  Yes Pearson Grippe, MD  rosuvastatin (CRESTOR) 5 MG tablet Take 5 mg by mouth daily. 01/02/23  Yes [provider]  amoxicillin (AMOXIL) 500 MG tablet Take 4 tablets (2,000 mg total) by mouth as directed. 1 hour prior to dental work including cleanings 10/31/22   Janetta Hora, PA-C  aspirin EC 81 MG tablet Take 1 tablet (81 mg total) by mouth daily. Swallow  whole. Patient taking differently: Take 81 mg by mouth at bedtime. Swallow whole. 09/13/22   [place], Peter M, MD  fexofenadine (ALLEGRA) 180 MG tablet Take 180 mg by mouth daily as needed for allergies.    [provider]  fluticasone (FLONASE) 50 MCG/ACT nasal spray Place 1-2 sprays into both nostrils daily as needed for allergies.    [provider]  meclizine (ANTIVERT) 25 MG tablet Take 1 tablet (25 mg total) by mouth 3 (three) times daily as needed for dizziness. 07/16/16   Long, Arlyss Repress, MD  Multiple Vitamins-Minerals (PRESERVISION AREDS 2 PO) Take 1 tablet by mouth in the morning and at bedtime.    [provider]  Multiple Vitamins-Minerals (PRESERVISION AREDS 2 PO) PreserVision AREDS 2    [provider]    Family History Family History  Problem Relation Age of Onset   Stroke Mother    Cancer Sister    Breast cancer Sister 46   Pneumonia Father    Kidney failure Brother    Aortic stenosis Brother     Social History Social History   Tobacco Use   Smoking status: Never   Smokeless tobacco: Never  Vaping Use   Vaping status: Never Used  Substance Use Topics   Alcohol use: No   Drug use: No     Allergies   Chlorthalidone and Demerol [meperidine]   Review of Systems Review of Systems   Physical Exam Triage Vital Signs ED Triage Vitals  Encounter Vitals Group     BP 06/11/23 1354 135/60     Systolic BP Percentile --      Diastolic BP Percentile --      Pulse Rate 06/11/23 1354 (!) 49     Resp 06/11/23 1354 18     Temp 06/11/23 1354 97.9 F (36.6 C)     Temp Source 06/11/23 1354 Oral     SpO2 06/11/23 1354 99 %     Weight 06/11/23 1357 113 lb 15.7 oz (51.7 kg)     Height 06/11/23 1357 5\' 3"  (1.6 m)     Head Circumference --      Peak Flow --      Pain Score 06/11/23 1357 0     Pain Loc --      Pain Education --      Exclude from Growth Chart --    No data found.  Updated Vital Signs BP 135/60 (BP Location: Left  Arm)   Pulse (!) 49   Temp 97.9 F (36.6 C) (Oral)   Resp 18   Ht 5\' 3"  (1.6 m)   Wt 51.7 kg   SpO2 99%   BMI 20.19 kg/m   Visual Acuity Right Eye Distance:   Left Eye Distance:   Bilateral Distance:    Right Eye Near:   Left Eye Near:    Bilateral Near:     Physical Exam Vitals reviewed.  Constitutional:      General: She is not in acute distress.    Appearance: She is not toxic-appearing.  Cardiovascular:     Rate and Rhythm: Regular rhythm.     Comments: Heart rate is about 44 on exam. Skin:    Coloration: Skin is not jaundiced or pale.  Neurological:     Mental Status: She is alert.      UC Treatments / Results  Labs (all labs ordered are listed, but only abnormal results are displayed) Labs Reviewed - No data to display  EKG   Radiology DG Chest 2 View Result Date: 06/11/2023 CLINICAL DATA:  Bradycardia. EXAM: CHEST - 2 VIEW COMPARISON:  10/22/2022. FINDINGS: Bilateral lung fields are clear. Bilateral costophrenic angles are clear. Normal cardio-mediastinal silhouette. New aortic valve noted. No acute osseous abnormalities. The soft tissues are within normal limits. There are surgical clips in the right upper quadrant, typical of a previous cholecystectomy. IMPRESSION: No active cardiopulmonary disease. Electronically Signed   By: Jules Schick M.D.   On: 06/11/2023 16:22    Procedures Procedures (including critical care time)  Medications Ordered in UC Medications - No data to display  Initial Impression / Assessment and Plan / UC Course  I have reviewed the triage vital signs and the nursing notes.  Pertinent labs & imaging results that were available during my care of the patient were reviewed by me and considered in my medical decision making (see chart for details).      Heart rate on EKG was 49.  Though she does not have any chest pain, with this acute change I think she needs to be evaluated in the emergency room.  She will go with her  friend in a private vehicle to the emergency room. Final Clinical Impressions(s) / UC Diagnoses   Final diagnoses:  Bradycardia     Discharge Instructions      She went to the ER     ED Prescriptions   None    PDMP not reviewed this encounter.   Zenia Resides, MD 06/11/23 [phone]

## 2023-06-12 ENCOUNTER — Encounter (HOSPITAL_COMMUNITY)
Admission: EM | Disposition: A | Discharge status: Home / Self Care | Payer: Self-pay | Source: Home / Self Care | Attending: Internal Medicine

## 2023-06-12 ENCOUNTER — Other Ambulatory Visit: Payer: Self-pay

## 2023-06-12 ENCOUNTER — Encounter (HOSPITAL_COMMUNITY): Payer: Self-pay | Admitting: Internal Medicine

## 2023-06-12 DIAGNOSIS — Z803 Family history of malignant neoplasm of breast: Secondary | ICD-10-CM | POA: Diagnosis not present

## 2023-06-12 DIAGNOSIS — R001 Bradycardia, unspecified: Secondary | ICD-10-CM | POA: Diagnosis not present

## 2023-06-12 DIAGNOSIS — E871 Hypo-osmolality and hyponatremia: Secondary | ICD-10-CM | POA: Diagnosis present

## 2023-06-12 DIAGNOSIS — Z79899 Other long term (current) drug therapy: Secondary | ICD-10-CM | POA: Diagnosis not present

## 2023-06-12 DIAGNOSIS — Z9049 Acquired absence of other specified parts of digestive tract: Secondary | ICD-10-CM | POA: Diagnosis not present

## 2023-06-12 DIAGNOSIS — I442 Atrioventricular block, complete: Principal | ICD-10-CM | POA: Diagnosis present

## 2023-06-12 DIAGNOSIS — Z79890 Hormone replacement therapy (postmenopausal): Secondary | ICD-10-CM | POA: Diagnosis not present

## 2023-06-12 DIAGNOSIS — E785 Hyperlipidemia, unspecified: Secondary | ICD-10-CM | POA: Diagnosis present

## 2023-06-12 DIAGNOSIS — E039 Hypothyroidism, unspecified: Secondary | ICD-10-CM | POA: Diagnosis present

## 2023-06-12 DIAGNOSIS — Z8719 Personal history of other diseases of the digestive system: Secondary | ICD-10-CM | POA: Diagnosis not present

## 2023-06-12 DIAGNOSIS — Z953 Presence of xenogenic heart valve: Secondary | ICD-10-CM | POA: Diagnosis not present

## 2023-06-12 DIAGNOSIS — E119 Type 2 diabetes mellitus without complications: Secondary | ICD-10-CM | POA: Diagnosis present

## 2023-06-12 DIAGNOSIS — Z8249 Family history of ischemic heart disease and other diseases of the circulatory system: Secondary | ICD-10-CM | POA: Diagnosis not present

## 2023-06-12 DIAGNOSIS — M810 Age-related osteoporosis without current pathological fracture: Secondary | ICD-10-CM | POA: Diagnosis present

## 2023-06-12 DIAGNOSIS — I10 Essential (primary) hypertension: Secondary | ICD-10-CM | POA: Diagnosis present

## 2023-06-12 DIAGNOSIS — Z841 Family history of disorders of kidney and ureter: Secondary | ICD-10-CM | POA: Diagnosis not present

## 2023-06-12 DIAGNOSIS — Z85828 Personal history of other malignant neoplasm of skin: Secondary | ICD-10-CM | POA: Diagnosis not present

## 2023-06-12 DIAGNOSIS — K219 Gastro-esophageal reflux disease without esophagitis: Secondary | ICD-10-CM | POA: Diagnosis present

## 2023-06-12 DIAGNOSIS — Z888 Allergy status to other drugs, medicaments and biological substances status: Secondary | ICD-10-CM | POA: Diagnosis not present

## 2023-06-12 DIAGNOSIS — Z823 Family history of stroke: Secondary | ICD-10-CM | POA: Diagnosis not present

## 2023-06-12 DIAGNOSIS — Z885 Allergy status to narcotic agent status: Secondary | ICD-10-CM | POA: Diagnosis not present

## 2023-06-12 DIAGNOSIS — Z7982 Long term (current) use of aspirin: Secondary | ICD-10-CM | POA: Diagnosis not present

## 2023-06-12 DIAGNOSIS — N179 Acute kidney failure, unspecified: Secondary | ICD-10-CM | POA: Diagnosis not present

## 2023-06-12 HISTORY — PX: PACEMAKER IMPLANT: EP1218

## 2023-06-12 LAB — COMPREHENSIVE METABOLIC PANEL
ALT: 19 U/L (ref 0–44)
AST: 33 U/L (ref 15–41)
Albumin: 3.5 g/dL (ref 3.5–5.0)
Alkaline Phosphatase: 67 U/L (ref 38–126)
Anion gap: 10 (ref 5–15)
BUN: 14 mg/dL (ref 8–23)
CO2: 21 mmol/L — ABNORMAL LOW (ref 22–32)
Calcium: 9.1 mg/dL (ref 8.9–10.3)
Chloride: 104 mmol/L (ref 98–111)
Creatinine, Ser: 0.96 mg/dL (ref 0.44–1.00)
GFR, Estimated: 58 mL/min — ABNORMAL LOW (ref >60)
Glucose, Bld: 121 mg/dL — ABNORMAL HIGH (ref 70–99)
Potassium: 4.2 mmol/L (ref 3.5–5.1)
Sodium: 135 mmol/L (ref 135–145)
Total Bilirubin: 0.7 mg/dL (ref 0.0–1.2)
Total Protein: 6.6 g/dL (ref 6.5–8.1)

## 2023-06-12 LAB — TSH: TSH: 0.242 u[IU]/mL — ABNORMAL LOW (ref 0.350–4.500)

## 2023-06-12 LAB — T4, FREE: Free T4: 1.25 ng/dL — ABNORMAL HIGH (ref 0.61–1.12)

## 2023-06-12 LAB — MAGNESIUM: Magnesium: 2.2 mg/dL (ref 1.7–2.4)

## 2023-06-12 SURGERY — PACEMAKER IMPLANT

## 2023-06-12 MED ORDER — ONDANSETRON HCL 4 MG/2ML IJ SOLN
4.0000 mg | Freq: Four times a day (QID) | INTRAMUSCULAR | Status: DC | PRN
  Administered 2023-06-12: 4 mg via INTRAVENOUS
  Filled 2023-06-12: qty 2

## 2023-06-12 MED ORDER — MIDAZOLAM HCL 2 MG/2ML IJ SOLN
INTRAMUSCULAR | Status: AC
  Filled 2023-06-12: qty 2

## 2023-06-12 MED ORDER — ACETAMINOPHEN 325 MG PO TABS: 325.0000 mg | ORAL_TABLET | ORAL | Status: DC | PRN

## 2023-06-12 MED ORDER — ACETAMINOPHEN 650 MG RE SUPP: 650.0000 mg | Freq: Four times a day (QID) | RECTAL | Status: DC | PRN

## 2023-06-12 MED ORDER — PANTOPRAZOLE SODIUM 40 MG PO TBEC
40.0000 mg | DELAYED_RELEASE_TABLET | Freq: Every day | ORAL | Status: DC
Start: 2023-06-12 — End: 2023-06-13
  Filled 2023-06-12: qty 1

## 2023-06-12 MED ORDER — LIDOCAINE HCL (PF) 1 % IJ SOLN
INTRAMUSCULAR | Status: AC
Start: 2023-06-12 — End: ?
  Filled 2023-06-12: qty 60

## 2023-06-12 MED ORDER — LEVOTHYROXINE SODIUM 88 MCG PO TABS
88.0000 ug | ORAL_TABLET | Freq: Every day | ORAL | Status: DC
  Administered 2023-06-13: 88 ug via ORAL
  Filled 2023-06-12: qty 1

## 2023-06-12 MED ORDER — SODIUM CHLORIDE 0.9 % IV SOLN: INTRAVENOUS | Status: DC

## 2023-06-12 MED ORDER — ACETAMINOPHEN 325 MG PO TABS: 650.0000 mg | ORAL_TABLET | Freq: Four times a day (QID) | ORAL | Status: DC | PRN

## 2023-06-12 MED ORDER — CEFAZOLIN SODIUM-DEXTROSE 1-4 GM/50ML-% IV SOLN
1.0000 g | Freq: Four times a day (QID) | INTRAVENOUS | Status: AC
  Administered 2023-06-12 – 2023-06-13 (×3): 1 g via INTRAVENOUS
  Filled 2023-06-12 (×3): qty 50

## 2023-06-12 MED ORDER — LISINOPRIL 20 MG PO TABS
40.0000 mg | ORAL_TABLET | Freq: Every day | ORAL | Status: DC
  Administered 2023-06-12: 40 mg via ORAL
  Filled 2023-06-12 (×2): qty 2

## 2023-06-12 MED ORDER — HEPARIN (PORCINE) IN NACL 1000-0.9 UT/500ML-% IV SOLN
INTRAVENOUS | Status: DC | PRN
  Administered 2023-06-12: 500 mL

## 2023-06-12 MED ORDER — SODIUM CHLORIDE 0.9 % IV SOLN
80.0000 mg | INTRAVENOUS | Status: AC
  Administered 2023-06-12: 80 mg

## 2023-06-12 MED ORDER — LIDOCAINE HCL (PF) 1 % IJ SOLN
INTRAMUSCULAR | Status: DC | PRN
  Administered 2023-06-12: 60 mL

## 2023-06-12 MED ORDER — FENTANYL CITRATE (PF) 100 MCG/2ML IJ SOLN
INTRAMUSCULAR | Status: AC
  Filled 2023-06-12: qty 2

## 2023-06-12 MED ORDER — ACETAMINOPHEN 325 MG PO TABS
650.0000 mg | ORAL_TABLET | Freq: Four times a day (QID) | ORAL | Status: DC | PRN
  Administered 2023-06-13 (×2): 650 mg via ORAL
  Filled 2023-06-12 (×2): qty 2

## 2023-06-12 MED ORDER — ROSUVASTATIN CALCIUM 5 MG PO TABS
5.0000 mg | ORAL_TABLET | Freq: Every day | ORAL | Status: DC
  Administered 2023-06-12: 5 mg via ORAL
  Filled 2023-06-12: qty 1

## 2023-06-12 MED ORDER — ONDANSETRON HCL 4 MG PO TABS: 4.0000 mg | ORAL_TABLET | Freq: Four times a day (QID) | ORAL | Status: DC | PRN

## 2023-06-12 MED ORDER — CHLORHEXIDINE GLUCONATE 4 % EX SOLN
60.0000 mL | Freq: Once | CUTANEOUS | Status: DC
  Filled 2023-06-12: qty 60

## 2023-06-12 MED ORDER — SODIUM CHLORIDE 0.9 % IV SOLN
INTRAVENOUS | Status: AC
  Filled 2023-06-12: qty 2

## 2023-06-12 MED ORDER — CEFAZOLIN SODIUM-DEXTROSE 2-4 GM/100ML-% IV SOLN
INTRAVENOUS | Status: AC
Start: 2023-06-12 — End: 2023-06-13
  Filled 2023-06-12: qty 100

## 2023-06-12 MED ORDER — CEFAZOLIN SODIUM-DEXTROSE 2-4 GM/100ML-% IV SOLN
2.0000 g | INTRAVENOUS | Status: AC
  Administered 2023-06-12: 2 g via INTRAVENOUS
  Filled 2023-06-12: qty 100

## 2023-06-12 MED ORDER — SENNOSIDES-DOCUSATE SODIUM 8.6-50 MG PO TABS: 1.0000 | ORAL_TABLET | Freq: Every evening | ORAL | Status: DC | PRN

## 2023-06-12 SURGICAL SUPPLY — 12 items
CABLE SURGICAL S-101-97-12 (CABLE) ×1 IMPLANT
CATH RIGHTSITE C315HIS02 (CATHETERS) IMPLANT
HEMOSTAT SURGICEL 2X4 FIBR (HEMOSTASIS) IMPLANT
LEAD SELECT SECURE 3830 383069 (Lead) IMPLANT
LEAD ULTIPACE 52 LPA1231/52 (Lead) IMPLANT
PACEMAKER ASSURITY DR-RF (Pacemaker) IMPLANT
PAD DEFIB RADIO PHYSIO CONN (PAD) ×1 IMPLANT
SELECT SECURE 3830 383069 (Lead) ×1 IMPLANT
SHEATH 7FR PRELUDE SNAP 13 (SHEATH) IMPLANT
SLITTER 6232ADJ (MISCELLANEOUS) IMPLANT
TRAY PACEMAKER INSERTION (PACKS) ×1 IMPLANT
WIRE HI TORQ VERSACORE-J 145CM (WIRE) IMPLANT

## 2023-06-12 NOTE — ED Provider Notes (Incomplete)
 I provided a substantive portion of the care of this patient.  I personally made/approved the management plan for this patient and take responsibility for the patient management. {Remember to document shared critical care using "edcritical" dot phrase:1}

## 2023-06-12 NOTE — Plan of Care (Signed)
  Problem: Education: Goal: Knowledge of cardiac device and self-care will improve Outcome: Progressing   Problem: Education: Goal: Knowledge of General Education information will improve Description: Including pain rating scale, medication(s)/side effects and non-pharmacologic comfort measures Outcome: Progressing

## 2023-06-12 NOTE — ED Provider Notes (Signed)
 [facility] Emergency Department Provider Note MRN:  [id number]  Arrival date & time: 06/12/23     Chief Complaint   Low Heart Rate   History of Present Illness   Margaret Lucas is a 89 y.o. year-old female presents to the ED with chief complaint of slow heart rate.  The patient states that she has had episodes of dyspnea with exertion and lightheadedness over the past few days.  She states that she has been monitoring her blood pressure and heart rate and this morning noted her heart rate to be in the low 40s.  This was new for her, so she called her cardiology office.  They advised her to go to an urgent care.  Urgent care referred her on to the emergency department.  Currently, patient denies having any pain, lightheadedness, or shortness of breath.  She denies any recent illnesses.  History provided by patient.   Review of Systems  Pertinent positive and negative review of systems noted in HPI.    Physical Exam   Vitals:   06/11/23 2052 06/12/23 0254  BP: (!) 167/47 (!) 161/51  Pulse: (!) 44 (!) 42  Resp: 18 16  Temp: 98.2 F (36.8 C) 98 F (36.7 C)  SpO2: 98% 100%    CONSTITUTIONAL:  well-appearing, NAD NEURO:  Alert and oriented x 3, CN 3-12 grossly intact EYES:  eyes equal and reactive ENT/NECK:  Supple, no stridor  CARDIO:  bradycardic, regular rhythm, appears well-perfused  PULM:  No respiratory distress, CTAB GI/GU:  non-distended,  MSK/SPINE:  No gross deformities, no edema, moves all extremities  SKIN:  no rash, atraumatic   *Additional and/or pertinent findings included in MDM below  Diagnostic and Interventional Summary    EKG Interpretation Date/Time:    Ventricular Rate:    PR Interval:    QRS Duration:    QT Interval:    QTC Calculation:   R Axis:      Text Interpretation:         Labs Reviewed  BASIC METABOLIC PANEL - Abnormal; Notable for the following components:      Result Value   Sodium 130 (*)     Chloride 97 (*)    Glucose, Bld 126 (*)    GFR, Estimated 57 (*)    All other components within normal limits  CBC - Abnormal; Notable for the following components:   Hemoglobin 11.4 (*)    HCT 33.7 (*)    All other components within normal limits  TSH  T4, FREE  TROPONIN I (HIGH SENSITIVITY)  TROPONIN I (HIGH SENSITIVITY)    DG Chest 2 View  Final Result      Medications - No data to display   Procedures  /  Critical Care .Critical Care  Performed by: Roxy Horseman, PA-C Authorized by: Roxy Horseman, PA-C   Critical care provider statement:    Critical care time (minutes):  35   Critical care was necessary to treat or prevent imminent or life-threatening deterioration of the following conditions:  Circulatory failure   Critical care was time spent personally by me on the following activities:  Development of treatment plan with patient or surrogate, discussions with consultants, evaluation of patient's response to treatment, examination of patient, ordering and review of laboratory studies, ordering and review of radiographic studies, ordering and performing treatments and interventions, pulse oximetry, re-evaluation of patient's condition and review of old charts   ED Course and Medical Decision Making  I have reviewed  the triage vital signs, the nursing notes, and pertinent available records from the EMR.  Social Determinants Affecting Complexity of Care: Patient has no clinically significant social determinants affecting this chief complaint..   ED Course: Clinical Course as of 06/12/23 0442  Wed Jun 12, 2023  0441 Basic metabolic panel(!) Sodium is a little low.  Patient says she has been drinking a lot of water lately. [RB]  0441 CBC(!) No leukocytosis or anemia [RB]  0441 Troponin I (High Sensitivity) Trop is 9, no active chest pain, doubt ACS. [RB]    Clinical Course User Index [RB] Roxy Horseman, PA-C    Medical Decision Making Amount and/or  Complexity of Data Reviewed Labs: ordered. Decision-making details documented in ED Course. Radiology: ordered and independent interpretation performed. Decision-making details documented in ED Course. ECG/medicine tests: ordered and independent interpretation performed. Decision-making details documented in ED Course.    Details: Junction rhythm, suspicious for 3rd degree heat block.  Risk Decision regarding hospitalization.         Consultants: I consulted with Dr. Jean Rosenthal, from cardiology, who is appreciated for consulting.  Requests medicine admit and will have EP see the patient.  I consulted with Dr. Arlean Hopping, who is appreciated for admitting.   Treatment and Plan: Patient's exam and diagnostic results are concerning for bradycardia, suspected 3rd heart block.  Feel that patient will need admission to the hospital for further treatment and evaluation.  Patient seen by and discussed with attending physician, Dr. Clayborne Dana, who agrees with plan.  Final Clinical Impressions(s) / ED Diagnoses     ICD-10-CM   1. Bradycardia  R00.1       ED Discharge Orders     None         Discharge Instructions Discussed with and Provided to Patient:   Discharge Instructions   None      Annayah, [place], PA-C 06/12/23 0501    Mesner, Barbara Cower, MD 06/12/23 [phone]

## 2023-06-12 NOTE — Progress Notes (Signed)
  Carryover admission to the Day Admitter.  I discussed this case with the EDP, Roxy Horseman, PA.  Per these discussions:   This is a 89 year old female who is being admitted with concern regarding potential high-grade heart block after presenting with dyspnea on exertion over the last few days the patient was able to monitor her heart rate at home over the last few days, noting it to be more bradycardic than normal, noting heart rates in the 40s.  With this information, she contacted her outpatient cardiologist, who recommended that she present to urgent care for evaluation.  Urgent care subsequently evaluated the patient and recommended that she present to the emergency department.   In the ED this evening, heart rates noted to be in the 40s, with corresponding blood pressures in the 160s.  No evidence of chest pain.  Troponin x 2 are flat and elevated.  Potassium level 4.3.  TSH is currently pending.  EKG is reported to be suggestive of junctional rhythm versus complete heart block.   EDP has discussed with on-call cardiology fellow who conveyed that cardiology will consult and recommended [facility] admission.  Cardiology conveyed that they will help facilitate involvement from electrophysiology in the morning.   I have placed an order for inpatient admission to PCU for further evaluation management of above.  I have placed some additional preliminary admit orders via the adult multi-morbid admission order set. I have also ordered magnesium level, and have kept the patient n.p.o. for now.    Newton Pigg, DO Hospitalist

## 2023-06-12 NOTE — H&P (Signed)
 History and Physical  Margaret Lucas WUJ:[id number] DOB: 1934/12/08 DOA: 06/11/2023  PCP: Irena Reichmann, DO   Chief Complaint: Near syncope  HPI: Margaret Lucas is a 89 y.o. female with medical history significant for hypothyroid, HTN, HLD, T2DM, s/p TAVR who presented to the ED for evaluation of near syncope.  Patient reports that last week, she had a couple spells that felt like she was going to pass out.  She was checking her BP yesterday and noted it was high but her heart rate was in the 40s.  Reports her previous heart rate was in the 70s the day before.  She called her cardiologist and was advised to present to the urgent care.  At the urgent care, she was found to be bradycardic and sent directly to the ED.  She denies any shortness of breath, syncope, chest pain, nausea, vomiting, palpitations or vision changes.  ED Course: Initial vitals showed HR 140s and SBP in the 130s to 160s with SpO2 98 in the 100% on room air. Labs significant for sodium of 130, K+ 4.3, glucose 126, creatinine 0.97, hemoglobin 11.4, troponin 7-9, TSH 0.242, free T4 1.25 (from 1.82 8 months ago).  EKG shows a junctional escape rhythm with a rate of 42.  Cardiology was consulted for evaluation, they evaluated patient and consulted EP for possible pacemaker. TRH consulted for admission  Review of Systems: Please see HPI for pertinent positives and negatives. A complete 10 system review of systems are otherwise negative.  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 Lynnette Caffey & Bartle   Severe aortic stenosis    Upper GI bleed 08/07/2016   Vertigo    Past Surgical History:   Procedure Laterality Date   CHOLECYSTECTOMY     COLONOSCOPY  2010 last   ESOPHAGOGASTRODUODENOSCOPY N/A 08/08/2016   Procedure: ESOPHAGOGASTRODUODENOSCOPY (EGD);  Surgeon: Iva Boop, MD;  Location: [facility] ENDOSCOPY;  Service: Endoscopy;  Laterality: N/A;   INTRAOPERATIVE TRANSTHORACIC ECHOCARDIOGRAM N/A 10/23/2022   Procedure: INTRAOPERATIVE TRANSTHORACIC ECHOCARDIOGRAM;  Surgeon: Orbie Pyo, MD;  Location: [facility] INVASIVE CV LAB;  Service: Open Heart Surgery;  Laterality: N/A;   RIGHT/LEFT HEART CATH AND CORONARY ANGIOGRAPHY N/A 09/21/2022   Procedure: RIGHT/LEFT HEART CATH AND CORONARY ANGIOGRAPHY;  Surgeon: Swaziland, Peter M, MD;  Location: [facility] INVASIVE CV LAB;  Service: Cardiovascular;  Laterality: N/A;   SKIN CANCER EXCISION     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    TRANSCATHETER AORTIC VALVE REPLACEMENT, TRANSFEMORAL N/A 10/23/2022   Procedure: Transcatheter Aortic Valve Replacement, Transfemoral;  Surgeon: Orbie Pyo, MD;  Location: [facility] INVASIVE CV LAB;  Service: Open Heart Surgery;  Laterality: N/A;   TUBAL LIGATION     Social History:  reports that she has never smoked. She has never used smokeless tobacco. She reports that she does not drink alcohol and does not use drugs.  Allergies  Allergen Reactions   Chlorthalidone Other (See Comments)    Hyponatremia caused hospital stay   Demerol [Meperidine] Other (See Comments)    unknown    Family History  Problem Relation Age of Onset  Stroke Mother    Cancer Sister    Breast cancer Sister 57   Pneumonia Father    Kidney failure Brother    Aortic stenosis Brother      Prior to Admission medications   Medication Sig Start Date End Date Taking? Authorizing Provider  amLODipine (NORVASC) 10 MG tablet Take 10 mg by mouth at bedtime.    [provider]  amoxicillin (AMOXIL) 500 MG tablet Take 4 tablets (2,000 mg total) by mouth as directed. 1 hour prior to dental work including cleanings  10/31/22   Janetta Hora, PA-C  aspirin EC 81 MG tablet Take 1 tablet (81 mg total) by mouth daily. Swallow whole. Patient taking differently: Take 81 mg by mouth at bedtime. Swallow whole. 09/13/22   Swazil[place], Peter M, MD  Cholecalciferol (VITAMIN D) 125 MCG (5000 UT) CAPS Take 5,000 Units by mouth at bedtime.    [provider]  fexofenadine (ALLEGRA) 180 MG tablet Take 180 mg by mouth daily as needed for allergies.    [provider]  fluticasone (FLONASE) 50 MCG/ACT nasal spray Place 1-2 sprays into both nostrils daily as needed for allergies.    [provider]  furosemide (LASIX) 20 MG tablet TAKE 1 TABLET BY MOUTH EVERY DAY Patient taking differently: Take 20 mg by mouth daily. Last dose: 02-24, 3x per week. 03/22/23   Georgie Chard D, NP  levothyroxine (SYNTHROID, LEVOTHROID) 100 MCG tablet Take 100 mcg by mouth daily before breakfast.    [provider]  lisinopril (ZESTRIL) 40 MG tablet Take 1 tablet (40 mg total) by mouth daily. 09/24/22   Orbie Pyo, MD  meclizine (ANTIVERT) 25 MG tablet Take 1 tablet (25 mg total) by mouth 3 (three) times daily as needed for dizziness. 07/16/16   Long, Arlyss Repress, MD  Multiple Vitamins-Minerals (PRESERVISION AREDS 2 PO) Take 1 tablet by mouth in the morning and at bedtime.    [provider]  Multiple Vitamins-Minerals (PRESERVISION AREDS 2 PO) PreserVision AREDS 2    [provider]  pantoprazole (PROTONIX) 40 MG tablet Take 1 tablet (40 mg total) by mouth 2 (two) times daily. Patient taking differently: Take 40 mg by mouth daily in the afternoon. 08/11/16   Pearson Grippe, MD  rosuvastatin (CRESTOR) 5 MG tablet Take 5 mg by mouth daily. 01/02/23   [provider]    Physical Exam: BP (!) 161/51 (BP Location: Right Arm)   Pulse (!) 42   Temp 98.3 F (36.8 C) (Oral)   Resp 16   SpO2 100%  General: Pleasant, well-appearing elderly woman laying in bed. No acute distress. HEENT: [place]/AT.  Anicteric sclera. Dry mucous membranes CV: Bradycardic.  Regular rhythm. No murmurs, rubs, or gallops. No LE edema Pulmonary: Lungs CTAB. Normal effort. No wheezing or rales. Abdominal: Soft, nontender, nondistended. Normal bowel sounds. Extremities: Palpable radial and DP pulses. Normal ROM. Skin: Warm and dry. No obvious rash or lesions. Decreased skin turgor. Neuro: A&Ox3. Moves all extremities. Normal sensation to light touch. No focal deficit. Psych: Normal mood and affect          Labs on Admission:  Basic Metabolic Panel: Recent Labs  Lab 06/11/23 1508  NA 130*  K 4.3  CL 97*  CO2 22  GLUCOSE 126*  BUN 18  CREATININE 0.97  CALCIUM 8.9   Liver Function Tests: No results for input(s): "AST", "ALT", "ALKPHOS", "BILITOT", "PROT", "ALBUMIN" in the last 168 hours. No results for input(s): "LIPASE", "AMYLASE" in the  last 168 hours. No results for input(s): "AMMONIA" in the last 168 hours. CBC: Recent Labs  Lab 06/11/23 1508  WBC 9.8  HGB 11.4*  HCT 33.7*  MCV 85.3  PLT 198   Cardiac Enzymes: No results for input(s): "CKTOTAL", "CKMB", "CKMBINDEX", "TROPONINI" in the last 168 hours. BNP (last 3 results) No results for input(s): "BNP" in the last 8760 hours.  ProBNP (last 3 results) No results for input(s): "PROBNP" in the last 8760 hours.  CBG: No results for input(s): "GLUCAP" in the last 168 hours.  Radiological Exams on Admission: DG Chest 2 View Result Date: 06/11/2023 CLINICAL DATA:  Bradycardia. EXAM: CHEST - 2 VIEW COMPARISON:  10/22/2022. FINDINGS: Bilateral lung fields are clear. Bilateral costophrenic angles are clear. Normal cardio-mediastinal silhouette. New aortic valve noted. No acute osseous abnormalities. The soft tissues are within normal limits. There are surgical clips in the right upper quadrant, typical of a previous cholecystectomy. IMPRESSION: No active cardiopulmonary disease. Electronically Signed   By: Jules Schick M.D.   On: 06/11/2023  16:22   Assessment/Plan Margaret Lucas is a 89 y.o. female with medical history significant for hypothyroid, HTN, HLD, T2DM, AS s/p TAVR who presented to the ED for evaluation of near syncope and admitted for symptomatic bradycardia with concern for complete heart block.  # Symptomatic bradycardia Patient with history of hypothyroidism and AS s/p TAVR presented with 1 to 2 weeks of near syncope and found to be bradycardic on admission. EKG showing junctional rhythm with concern for complete heart block. Patient evaluated by cardiology and EP with plan for pacemaker placement later today. -EP following, tentatively plan for PPM later today -Avoid AV nodal blockade -Telemetry  # Hypothyroidism Thyroid studies shows TSH low at 0.242 and free T4 slightly elevated at 1.25. She is currently on levothyroxine 100 mcg daily. Will reduce her current dose and repeat thyroid studies in 6 to 8 weeks. -Reduce levothyroxine to 88 mcg daily -Repeat TSH, free T4 in 6 to 8 weeks  # HTN BP elevated with SBP in the 130s to 160s -Continue lisinopril 40 mg daily -Discontinue amlodipine in the setting of bradycardia -Can consider HCTZ if BP not controlled with lisinopril alone  # GERD -Continue Protonix  # HLD -Continue Crestor  # T2DM -Follow-up A1c   DVT prophylaxis: SCDs    Code Status: Full Code  Consults called: Cardiology, EP  Family Communication: No family at bedside  Severity of Illness: The appropriate patient status for this patient is INPATIENT. Inpatient status is judged to be reasonable and necessary in order to provide the required intensity of service to ensure the patient's safety. The patient's presenting symptoms, physical exam findings, and initial radiographic and laboratory data in the context of their chronic comorbidities is felt to place them at high risk for further clinical deterioration. Furthermore, it is not anticipated that the patient will be medically stable for  discharge from the hospital within 2 midnights of admission.   * I certify that at the point of admission it is my clinical judgment that the patient will require inpatient hospital care spanning beyond 2 midnights from the point of admission due to high intensity of service, high risk for further deterioration and high frequency of surveillance required.*  Level of care: Progressive   Steffanie Rainwater, MD 06/12/2023, 7:32 AM [facility] Pager: [phone] Isaiah 41:10   If 7PM-7AM, please contact night-coverage [email or web address] Password [email or web address]

## 2023-06-12 NOTE — Progress Notes (Signed)
   06/12/23 1630  Assess: MEWS Score  BP (!) 96/27  MAP (mmHg) (!) 43  Pulse Rate (!) 57  ECG Heart Rate 70  Resp (!) 22  SpO2 99 %  Assess: MEWS Score  MEWS Temp 0  MEWS Systolic 1  MEWS Pulse 0  MEWS RR 1  MEWS LOC 0  MEWS Score 2  MEWS Score Color Yellow  Assess: if the MEWS score is Yellow or Red  Were vital signs accurate and taken at a resting state? Yes  Notify: Charge Nurse/RN  Name of Charge Nurse/RN Notified jen  Assess: SIRS CRITERIA  SIRS Temperature  0  SIRS Respirations  1  SIRS Pulse 0  SIRS WBC 0  SIRS Score Sum  1

## 2023-06-12 NOTE — Progress Notes (Signed)
 Did admission quesitons with family unable to do some due to patient asleep     Prior to admit independent, drove, per family this heart rate has made it harder for her to walk distances and change was sudden due to low heart rate

## 2023-06-12 NOTE — Consult Note (Signed)
 Cardiology Consultation   Patient ID: Margaret Lucas MRN: [id number]; DOB: Oct 23, 1934  Admit date: 06/11/2023 Date of Consult: 06/12/2023  PCP:  Irena Reichmann, DO   [facility] Providers Cardiologist:  Peter Swaziland, MD        Patient Profile:   Margaret Lucas is a 89 y.o. female with a hx of hypothyroidism, HTN, HLD, type 2 DM, severe aortic stenosis s/p TAVR implant (Sapien 3 Resilia 23 mm on 10/23/2022) who is being seen 06/12/2023 for the evaluation of symptomatic bradycardia and concern for complete heart block at the request of the emergency department.  History of Present Illness:   Margaret Lucas is a very pleasant woman with a chief complaint of exertional intolerance.  She is severely fatigued with minimal levels of exertion.  Symptoms have progressed over the course of the past week.  She has had episodes where she felt as though she would lose consciousness, but has not had any syncope.   She denies any shortness of breath or chest pain.  No dyspnea on exertion.  She has lower extremity edema, worse at the end of the day, and takes lasix 3 times weekly which does help her symptoms.  She is not having any orthopnea, PND, abdominal distension.    She is currently on amlodipine for blood pressure.  Does not take any beta blockers.   She has a history of hypothyroidism, on synthroid, and denies any changes in her dosing recently.  Most recent TSH was June 2024 at 2.3 (within normal limits).    She checked her blood pressure yesterday because of her symptoms, and noticed her heart rate was in the low 40s.  2 days ago, HR was in the 70s.  She has noted a HR between 42-47 bpm since yesterday AM.    In the emergency department, she was afebrile and hemodynamically stable with HR of 42 bpm, with what appears to be a junctional escape rhythm.  SBP was 158 mmHg at the time of my evaluation.  Not requiring any inotropic agents.  Cardiology was consulted for recommendations on  further management.    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 Lynnette Caffey & Bartle   Severe aortic stenosis    Upper GI bleed 08/07/2016   Vertigo     Past Surgical History:  Procedure Laterality Date   CHOLECYSTECTOMY     COLONOSCOPY  2010 last   ESOPHAGOGASTRODUODENOSCOPY N/A 08/08/2016   Procedure: ESOPHAGOGASTRODUODENOSCOPY (EGD);  Surgeon: Iva Boop, MD;  Location: [facility] ENDOSCOPY;  Service: Endoscopy;  Laterality: N/A;   INTRAOPERATIVE TRANSTHORACIC ECHOCARDIOGRAM N/A 10/23/2022   Procedure: INTRAOPERATIVE TRANSTHORACIC ECHOCARDIOGRAM;  Surgeon: Orbie Pyo, MD;  Location: [facility] INVASIVE CV LAB;  Service: Open Heart Surgery;  Laterality: N/A;   RIGHT/LEFT HEART CATH AND CORONARY ANGIOGRAPHY N/A 09/21/2022   Procedure: RIGHT/LEFT HEART CATH AND CORONARY ANGIOGRAPHY;  Surgeon: Swaziland, Peter M, MD;  Location: [facility] INVASIVE CV LAB;  Service: Cardiovascular;  Laterality: N/A;   SKIN CANCER EXCISION     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    TRANSCATHETER AORTIC VALVE  REPLACEMENT, TRANSFEMORAL N/A 10/23/2022   Procedure: Transcatheter Aortic Valve Replacement, Transfemoral;  Surgeon: Orbie Pyo, MD;  Location: [facility] INVASIVE CV LAB;  Service: Open Heart Surgery;  Laterality: N/A;   TUBAL LIGATION       Home Medications:  Prior to Admission medications   Medication Sig Start Date End Date Taking? Authorizing Provider  amLODipine (NORVASC) 10 MG tablet Take 10 mg by mouth at bedtime.    [provider]  amoxicillin (AMOXIL) 500 MG tablet Take 4 tablets (2,000 mg total) by mouth as directed. 1 hour prior to  dental work including cleanings 10/31/22   Janetta Hora, PA-C  aspirin EC 81 MG tablet Take 1 tablet (81 mg total) by mouth daily. Swallow whole. Patient taking differently: Take 81 mg by mouth at bedtime. Swallow whole. 09/13/22   [place], Peter M, MD  Cholecalciferol (VITAMIN D) 125 MCG (5000 UT) CAPS Take 5,000 Units by mouth at bedtime.    [provider]  fexofenadine (ALLEGRA) 180 MG tablet Take 180 mg by mouth daily as needed for allergies.    [provider]  fluticasone (FLONASE) 50 MCG/ACT nasal spray Place 1-2 sprays into both nostrils daily as needed for allergies.    [provider]  furosemide (LASIX) 20 MG tablet TAKE 1 TABLET BY MOUTH EVERY DAY Patient taking differently: Take 20 mg by mouth daily. Last dose: 02-24, 3x per week. 03/22/23   Georgie Chard D, NP  levothyroxine (SYNTHROID, LEVOTHROID) 100 MCG tablet Take 100 mcg by mouth daily before breakfast.    [provider]  lisinopril (ZESTRIL) 40 MG tablet Take 1 tablet (40 mg total) by mouth daily. 09/24/22   Orbie Pyo, MD  meclizine (ANTIVERT) 25 MG tablet Take 1 tablet (25 mg total) by mouth 3 (three) times daily as needed for dizziness. 07/16/16   Long, Arlyss Repress, MD  Multiple Vitamins-Minerals (PRESERVISION AREDS 2 PO) Take 1 tablet by mouth in the morning and at bedtime.    [provider]  Multiple Vitamins-Minerals (PRESERVISION AREDS 2 PO) PreserVision AREDS 2    [provider]  pantoprazole (PROTONIX) 40 MG tablet Take 1 tablet (40 mg total) by mouth 2 (two) times daily. Patient taking differently: Take 40 mg by mouth daily in the afternoon. 08/11/16   Pearson Grippe, MD  rosuvastatin (CRESTOR) 5 MG tablet Take 5 mg by mouth daily. 01/02/23   [provider]    Inpatient Medications: Scheduled Meds:  Continuous Infusions:  PRN Meds: acetaminophen **OR** acetaminophen  Allergies:    Allergies  Allergen Reactions   Chlorthalidone     Other  reaction(s): hyponatremia  caused hospital stay   Demerol [Meperidine] Other (See Comments)    unknown    Social History:   Social History   Socioeconomic History   Marital status: Widowed    Spouse name: Not on file   Number of children: 3   Years of education: Not on file   Highest education level: Not on file  Occupational History   Occupation: retired Runner, broadcasting/film/video  Tobacco Use   Smoking status: Never   Smokeless tobacco: Never  Vaping Use   Vaping status: Never Used  Substance and Sexual Activity   Alcohol use: No   Drug use: No   Sexual activity: Not Currently  Other Topics Concern   Not on file  Social History Narrative   Not on file   Social Drivers of Health   Financial Resource Strain: Not on file  Food  Insecurity: No Food Insecurity (10/23/2022)   Hunger Vital Sign    Worried About Running Out of Food in the Last Year: Never true    Ran Out of Food in the Last Year: Never true  Transportation Needs: No Transportation Needs (10/23/2022)   PRAPARE - Administrator, Civil Service (Medical): No    Lack of Transportation (Non-Medical): No  Physical Activity: Not on file  Stress: Not on file  Social Connections: Not on file  Intimate Partner Violence: Not At Risk (10/23/2022)   Humiliation, Afraid, Rape, and Kick questionnaire    Fear of Current or Ex-Partner: No    Emotionally Abused: No    Physically Abused: No    Sexually Abused: No    Family History:    Family History  Problem Relation Age of Onset   Stroke Mother    Cancer Sister    Breast cancer Sister 27   Pneumonia Father    Kidney failure Brother    Aortic stenosis Brother      ROS:  Please see the history of present illness.   All other ROS reviewed and negative.     Physical Exam/Data:   Vitals:   06/11/23 1505 06/11/23 2052 06/12/23 0254  BP: (!) 166/51 (!) 167/47 (!) 161/51  Pulse: (!) 49 (!) 44 (!) 42  Resp: 18 18 16   Temp: (!) 97.4 F (36.3 C) 98.2 F (36.8 C) 98 F (36.7  C)  SpO2: 95% 98% 100%   No intake or output data in the 24 hours ending 06/12/23 0507    06/11/2023    1:57 PM 01/25/2023    3:32 PM 12/05/2022   10:05 AM  Last 3 Weights  Weight (lbs) 113 lb 15.7 oz 114 lb 114 lb 6.4 oz  Weight (kg) 51.7 kg 51.71 kg 51.891 kg     There is no height or weight on file to calculate BMI.  General:  Well nourished, well developed, in no acute distress HEENT: normal Neck: no JVD Vascular: No carotid bruits; Distal pulses 2+ bilaterally Cardiac:  normal S1, S2; bradycardic with regular rhythm, systolic murmur heard throughout precordium  Lungs:  clear to auscultation bilaterally, no wheezing, rhonchi or rales  Abd: soft, nontender, no hepatomegaly  Ext: 2+ pitting edema bilateral lower extremities to the knee  Musculoskeletal:  No deformities, BUE and BLE strength normal and equal Skin: warm and dry  Neuro:  CNs 2-12 intact, no focal abnormalities noted Psych:  Normal affect   EKG:  The EKG was personally reviewed and demonstrates:  likely ectopic atrial rhythm, junctional escape rhythm with RBBB morphology, and likely complete heart block.   Laboratory Data:  High Sensitivity Troponin:   Recent Labs  Lab 06/11/23 1508 06/11/23 1723  TROPONINIHS 7 9     Chemistry Recent Labs  Lab 06/11/23 1508  NA 130*  K 4.3  CL 97*  CO2 22  GLUCOSE 126*  BUN 18  CREATININE 0.97  CALCIUM 8.9  GFRNONAA 57*  ANIONGAP 11    No results for input(s): "PROT", "ALBUMIN", "AST", "ALT", "ALKPHOS", "BILITOT" in the last 168 hours. Lipids No results for input(s): "CHOL", "TRIG", "HDL", "LABVLDL", "LDLCALC", "CHOLHDL" in the last 168 hours.  Hematology Recent Labs  Lab 06/11/23 1508  WBC 9.8  RBC 3.95  HGB 11.4*  HCT 33.7*  MCV 85.3  MCH 28.9  MCHC 33.8  RDW 12.8  PLT 198   Thyroid No results for input(s): "TSH", "FREET4" in the last 168 hours.  BNPNo results for input(s): "BNP", "PROBNP" in the last 168 hours.  DDimer No results for input(s):  "DDIMER" in the last 168 hours.  ECHO 12/05/22 IMPRESSIONS     1. Mild intracavitary gradient. Peak velocity 0.96 m/s. Peak gradient 3.7  mmHg. Left ventricular ejection fraction, by estimation, is 60 to 65%.  Left ventricular ejection fraction by 3D volume is 64 %. The left  ventricle has normal function. The left  ventricle has no regional wall motion abnormalities. Left ventricular  diastolic parameters are consistent with Grade I diastolic dysfunction  (impaired relaxation). The average left ventricular global longitudinal  strain is -18.1 %. The global  longitudinal strain is normal.   2. Right ventricular systolic function is normal. The right ventricular  size is normal. There is normal pulmonary artery systolic pressure.   3. The mitral valve is normal in structure. Trivial mitral valve  regurgitation. No evidence of mitral stenosis.   4. TAVR mean gradient 15 mmHg, increased from 11 mmHg 10/2022. The aortic  valve has been repaired/replaced. Aortic valve regurgitation is not  visualized. No aortic stenosis is present. There is a 23 mm Edwards Sapien  prosthetic (TAVR) valve present in  the aortic position. Procedure Date: 10/23/2022. Echo findings are  consistent with normal structure and function of the aortic valve  prosthesis. Aortic valve area, by VTI measures 1.90 cm. Aortic valve mean  gradient measures 15.0 mmHg. Aortic valve Vmax  measures 2.54 m/s.   5. The inferior vena cava is normal in size with greater than 50%  respiratory variability, suggesting right atrial pressure of 3 mmHg.   Radiology/Studies:  DG Chest 2 View Result Date: 06/11/2023 CLINICAL DATA:  Bradycardia. EXAM: CHEST - 2 VIEW COMPARISON:  10/22/2022. FINDINGS: Bilateral lung fields are clear. Bilateral costophrenic angles are clear. Normal cardio-mediastinal silhouette. New aortic valve noted. No acute osseous abnormalities. The soft tissues are within normal limits. There are surgical clips in the  right upper quadrant, typical of a previous cholecystectomy. IMPRESSION: No active cardiopulmonary disease. Electronically Signed   By: Jules Schick M.D.   On: 06/11/2023 16:22    Assessment and Plan:  89 y.o. female with a hx of hypothyroidism, HTN, HLD, type 2 DM, severe aortic stenosis s/p TAVR implant (Sapien 3 Resilia 23 mm on 10/23/2022) who is being seen 06/12/2023 for the evaluation of symptomatic bradycardia and concern for complete heart block at the request of the emergency department.  ECG with multiple p-wave morphologies consistent with a wandering atrial pacemaker.  She is in a junctional escape rhythm at 42 bpm, which fortunately is hemodynamically stable.    We should evaluate for underlying causes, agree with checking a TSH and free t4 level for further evaluation.  Also recommend repeat echocardiogram and holding all AV nodal blocking agents.    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

## 2023-06-12 NOTE — Consult Note (Addendum)
 ELECTROPHYSIOLOGY CONSULT NOTE    Patient ID: Margaret Lucas MRN: [id number], DOB/AGE: [birth year] 87 y.o.  Admit date: 06/11/2023 Date of Consult: 06/12/2023  Primary Physician: Irena Reichmann, DO Primary Cardiologist: Peter Swaziland, MD  Electrophysiologist: New   Referring Provider: Dr. Kirke Corin  Patient Profile: Margaret Lucas is a 89 y.o. female with a history of hypothyroid, HTN, HLD, DM2, s/p TAVR who is being seen today for the evaluation of CHB at the request of Dr. Kirke Corin.  HPI:  Margaret Lucas is a 89 y.o. female with medical history as above.   EP asked to see with CHB on presentation.   She has had worsening fatigue and several episodes of near syncope for the past 2 weeks. She started checking her BP and noted that it was high, and her pulse < 50. (Lowest at home 42).  She has a h/o TVAR 10/2022 and remembers being told that she might need pacing after, but did OK.   No AV nodal agents on board, no ischemic symptoms, SOB, CP, or DOE. Mild peripheral edema at end of day well managed on lasix 3 times weekly. No orthopnea.   Labs Potassium4.3 (02/25 1508)   Creatinine, ser  0.97 (02/25 1508) PLT  198 (02/25 1508) HGB  11.4* (02/25 1508) WBC 9.8 (02/25 1508) Troponin I (High Sensitivity)9 (02/25 1723).    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 Lynnette Caffey & Bartle   Severe aortic stenosis    Upper GI bleed 08/07/2016   Vertigo      Surgical History:  Past Surgical History:  Procedure Laterality Date   CHOLECYSTECTOMY     COLONOSCOPY  2010 last   ESOPHAGOGASTRODUODENOSCOPY N/A 08/08/2016   Procedure:  ESOPHAGOGASTRODUODENOSCOPY (EGD);  Surgeon: Iva Boop, MD;  Location: [facility] ENDOSCOPY;  Service: Endoscopy;  Laterality: N/A;   INTRAOPERATIVE TRANSTHORACIC ECHOCARDIOGRAM N/A 10/23/2022   Procedure: INTRAOPERATIVE TRANSTHORACIC ECHOCARDIOGRAM;  Surgeon: Orbie Pyo, MD;  Location: [facility] INVASIVE CV LAB;  Service: Open Heart Surgery;  Laterality: N/A;   RIGHT/LEFT HEART CATH AND CORONARY ANGIOGRAPHY N/A 09/21/2022   Procedure: RIGHT/LEFT HEART CATH AND CORONARY ANGIOGRAPHY;  Surgeon: Swaziland, Peter M, MD;  Location: [facility] INVASIVE CV LAB;  Service: Cardiovascular;  Laterality: N/A;   SKIN CANCER EXCISION     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    TRANSCATHETER AORTIC VALVE REPLACEMENT, TRANSFEMORAL N/A 10/23/2022   Procedure: Transcatheter Aortic Valve Replacement, Transfemoral;  Surgeon: Orbie Pyo, MD;  Location: [facility] INVASIVE CV LAB;  Service: Open Heart Surgery;  Laterality: N/A;   TUBAL LIGATION       (Not in a hospital admission)   Inpatient Medications:   Allergies:  Allergies  Allergen Reactions   Chlorthalidone Other (See Comments)    Hyponatremia caused hospital stay   Demerol [Meperidine] Other (See Comments)    unknown    Family History  Problem Relation Age of Onset   Stroke Mother    Cancer Sister    Breast cancer Sister 75   Pneumonia Father    Kidney failure Brother    Aortic stenosis Brother  Physical Exam: Vitals:   06/11/23 1505 06/11/23 2052 06/12/23 0254 06/12/23 0654  BP: (!) 166/51 (!) 167/47 (!) 161/51   Pulse: (!) 49 (!) 44 (!) 42   Resp: 18 18 16    Temp: (!) 97.4 F (36.3 C) 98.2 F (36.8 C) 98 F (36.7 C) 98.3 F (36.8 C)  TempSrc:    Oral  SpO2: 95% 98% 100%     GEN- NAD, A&O x 3, normal affect HEENT: Normocephalic, atraumatic Lungs- CTAB, Normal effort.  Heart- Slow, Regular rate and rhythm, No M/G/R.  GI- Soft, NT, ND.  Extremities- No clubbing, cyanosis, or edema   Radiology/Studies: DG Chest 2  View Result Date: 06/11/2023 CLINICAL DATA:  Bradycardia. EXAM: CHEST - 2 VIEW COMPARISON:  10/22/2022. FINDINGS: Bilateral lung fields are clear. Bilateral costophrenic angles are clear. Normal cardio-mediastinal silhouette. New aortic valve noted. No acute osseous abnormalities. The soft tissues are within normal limits. There are surgical clips in the right upper quadrant, typical of a previous cholecystectomy. IMPRESSION: No active cardiopulmonary disease. Electronically Signed   By: Jules Schick M.D.   On: 06/11/2023 16:22    EKG: on arrival shows CHB in 40s with junctional escape (RBBB) (personally reviewed)  TELEMETRY: CHB with a junctional escape in the 40s (personally reviewed)  Assessment/Plan:  CHB Junctional escape (RBBB)  Baseline bifasicular block with PR interval of >220 and QRS 132 (RBBB) No reversible causes identified.  Echo 11/2022 with normal EF Mild, non-obstructive CAD pre TAVR 09/2022 PYP scan 11/2022 low risk/equivocal  Explained risks, benefits, and alternatives to PPM implantation, including but not limited to bleeding, infection, pneumothorax, pericardial effusion, lead dislodgement, heart attack, stroke, or death.  Pt verbalized understanding and agrees to proceed.       HTN Conservative management for now, will likely improve with pacing.   AS s/p TAVR Excellent recovery and stable on post Echo.   Dr. Graciela Husbands to see. Tentatively plan for PPM later today.   For questions or updates, please contact [facility] Please consult [email or web address] for contact info under Cardiology/STEMI.  Signed, Graciella Freer, PA-C  06/12/2023 7:36 AM  For pacing toay for late presenting complete heart block with prior RBBB andpost procedure prolongation of the PR interval  The benefits and risks were reviewed including but not limited to death,  perforation, infection, lead dislodgement and device malfunction.  The patient understands agrees and is willing to  proceed.

## 2023-06-12 NOTE — Plan of Care (Signed)
  Problem: Education: Goal: Knowledge of cardiac device and self-care will improve 06/12/2023 1957 by Dallas Breeding, RN Outcome: Progressing 06/12/2023 1956 by Dallas Breeding, RN Outcome: Progressing   Problem: Education: Goal: Knowledge of General Education information will improve Description: Including pain rating scale, medication(s)/side effects and non-pharmacologic comfort measures 06/12/2023 1957 by Dallas Breeding, RN Outcome: Progressing 06/12/2023 1956 by Dallas Breeding, RN Outcome: Progressing   Problem: Activity: Goal: Risk for activity intolerance will decrease Outcome: Progressing

## 2023-06-13 ENCOUNTER — Inpatient Hospital Stay (HOSPITAL_COMMUNITY): Payer: Medicare PPO

## 2023-06-13 ENCOUNTER — Other Ambulatory Visit (HOSPITAL_COMMUNITY): Payer: Self-pay

## 2023-06-13 DIAGNOSIS — R001 Bradycardia, unspecified: Secondary | ICD-10-CM | POA: Diagnosis not present

## 2023-06-13 DIAGNOSIS — I10 Essential (primary) hypertension: Secondary | ICD-10-CM | POA: Diagnosis not present

## 2023-06-13 DIAGNOSIS — I350 Nonrheumatic aortic (valve) stenosis: Secondary | ICD-10-CM

## 2023-06-13 DIAGNOSIS — E039 Hypothyroidism, unspecified: Secondary | ICD-10-CM | POA: Diagnosis not present

## 2023-06-13 DIAGNOSIS — I442 Atrioventricular block, complete: Secondary | ICD-10-CM | POA: Diagnosis not present

## 2023-06-13 LAB — CBC
HCT: 33.3 % — ABNORMAL LOW (ref 36.0–46.0)
Hemoglobin: 11.2 g/dL — ABNORMAL LOW (ref 12.0–15.0)
MCH: 28.6 pg (ref 26.0–34.0)
MCHC: 33.6 g/dL (ref 30.0–36.0)
MCV: 85.2 fL (ref 80.0–100.0)
Platelets: 172 10*3/uL (ref 150–400)
RBC: 3.91 MIL/uL (ref 3.87–5.11)
RDW: 13 % (ref 11.5–15.5)
WBC: 15.6 10*3/uL — ABNORMAL HIGH (ref 4.0–10.5)
nRBC: 0 % (ref 0.0–0.2)

## 2023-06-13 LAB — RENAL FUNCTION PANEL
Albumin: 2.7 g/dL — ABNORMAL LOW (ref 3.5–5.0)
Anion gap: 14 (ref 5–15)
BUN: 19 mg/dL (ref 8–23)
CO2: 16 mmol/L — ABNORMAL LOW (ref 22–32)
Calcium: 8.6 mg/dL — ABNORMAL LOW (ref 8.9–10.3)
Chloride: 102 mmol/L (ref 98–111)
Creatinine, Ser: 1.12 mg/dL — ABNORMAL HIGH (ref 0.44–1.00)
GFR, Estimated: 48 mL/min — ABNORMAL LOW (ref >60)
Glucose, Bld: 115 mg/dL — ABNORMAL HIGH (ref 70–99)
Phosphorus: 3.7 mg/dL (ref 2.5–4.6)
Potassium: 4.1 mmol/L (ref 3.5–5.1)
Sodium: 132 mmol/L — ABNORMAL LOW (ref 135–145)

## 2023-06-13 SURGERY — LEAD REVISION/REPAIR

## 2023-06-13 MED ORDER — ACETAMINOPHEN 325 MG PO TABS: 650.0000 mg | ORAL_TABLET | Freq: Four times a day (QID) | ORAL | Status: DC | PRN

## 2023-06-13 MED ORDER — SODIUM CHLORIDE 0.9 % IV SOLN: INTRAVENOUS | Status: DC

## 2023-06-13 MED ORDER — ASPIRIN 81 MG PO TBEC: 81.0000 mg | DELAYED_RELEASE_TABLET | Freq: Every day | ORAL | Status: AC

## 2023-06-13 MED ORDER — COLCHICINE 0.6 MG PO TABS
0.6000 mg | ORAL_TABLET | Freq: Two times a day (BID) | ORAL | 0 refills | Status: DC
  Filled 2023-06-13: qty 10, 5d supply, fill #0

## 2023-06-13 MED ORDER — LEVOTHYROXINE SODIUM 88 MCG PO TABS
88.0000 ug | ORAL_TABLET | Freq: Every day | ORAL | 3 refills | Status: AC
  Filled 2023-06-13: qty 30, 30d supply, fill #0

## 2023-06-13 MED ORDER — FUROSEMIDE 20 MG PO TABS: 20.0000 mg | ORAL_TABLET | ORAL | Status: DC

## 2023-06-13 MED ORDER — FUROSEMIDE 20 MG PO TABS: 20.0000 mg | ORAL_TABLET | ORAL | Status: AC

## 2023-06-13 NOTE — Discharge Summary (Addendum)
 Physician Discharge Summary   Patient: Margaret Lucas MRN: [id number] DOB: 01/27/1935  Admit date:     06/11/2023  Discharge date: 06/13/23  Discharge Physician: Thad Ranger, MD    PCP: Irena Reichmann, DO   Recommendations at discharge:   Prophylactic colchicine for 5 days Outpatient follow-up with the EP cardiology for pacemaker check Synthroid dose decreased to 88 mcg daily.  Please check thyroid labs in 6 weeks. Amlodipine discontinued, resume lisinopril on 06/15/2023 if BP allows  Discharge Diagnoses:    Complete heart block (HCC) status post pacemaker placement Symptomatic bradycardia   Hypothyroidism, unspecified   Essential hypertension   Severe aortic stenosis   S/P TAVR (transcatheter aortic valve replacement) Mild AKI, hyponatremia GERD Hyperlipidemia     Hospital Course:  Patient is a 89 year old female with hypothyroidism, HTN, HLD, T2DM, AS status post TAVR presented to ED for evaluation of near syncope.  Patient reported worsening fatigue and several episodes of near syncope in the last 2 weeks.  She checked her BP and noted that it was high however her heart rate was low, less than 50 and lowest at home was 42. She called her cardiologist and was advised to come to the urgent care.  At the urgent care she was found to be bradycardiac and sent her directly to ED. EKG showed junctional escape rhythm with rate 42, cardiology was consulted and recommended EP evaluation for possible pacemaker. TSH 0.242, free T41.2.   Assessment and Plan:   Complete heart block (HCC) with symptomatic bradycardia -History of hypothyroidism, AS status post TAVR with several episodes of near syncope in the last 2 weeks, symptomatic bradycardia -Cardiology consulted and seen by EP cardiology, pacemaker placed on 2/26. -This morning noted to have midsternal chest pain, sitting upright, per EP cardiology, will treat with prophylactic colchicine for 5 days and continue PPI.       Hypothyroidism, unspecified -TSH 0.242, free T41.25 -Patient noted to be on levothyroxine 100 mcg daily outpatient, reduced to 88 mcg daily.   -Repeat thyroid studies in 6 to 8 weeks.   Essential hypertension -Amlodipine discontinued. -Patient was placed on IV fluid hydration, lisinopril was held -If BP stable, can resume lisinopril on 06/15/2023   Mild AKI, mild hyponatremia -Creatinine 1.1 this morning, sodium 132 -Patient was placed on normal saline IV fluids   GERD -Continue Protonix    HLD -Continue Crestor   Diabetes mellitus type 2, diet controlled -CBGs controlled     Estimated body mass index is 20.19 kg/m as calculated from the following:   Height as of an earlier encounter on 06/11/23: 5\' 3"  (1.6 m).   Weight as of an earlier encounter on 06/11/23: 51.7 kg.      Pain control - [employer] Controlled Substance Reporting System database was reviewed. and patient was instructed, not to drive, operate heavy machinery, perform activities at heights, swimming or participation in water activities or provide baby-sitting services while on Pain, Sleep and Anxiety Medications; until their outpatient Physician has advised to do so again. Also recommended to not to take more than prescribed Pain, Sleep and Anxiety Medications.  Consultants: Cardiology, EP cardiology Procedures performed: Pacemaker placement Disposition: Home Diet recommendation:  Discharge Diet Orders (From admission, onward)     Start     Ordered   06/13/23 0000  Diet - low sodium heart healthy        06/13/23 1349            DISCHARGE MEDICATION: Allergies as of 06/13/2023  Reactions   Chlorthalidone Other (See Comments)   Hyponatremia caused hospital stay   Demerol [meperidine] Other (See Comments)   unknown        Medication List     PAUSE taking these medications    lisinopril 40 MG tablet Wait to take this until: June 15, 2023 Commonly known as: ZESTRIL Take 1 tablet (40 mg  total) by mouth daily.       STOP taking these medications    amLODipine 10 MG tablet Commonly known as: NORVASC       TAKE these medications    acetaminophen 325 MG tablet Commonly known as: TYLENOL Take 2 tablets (650 mg total) by mouth every 6 (six) hours as needed for mild pain (pain score 1-3) (or Fever >/= 101).   amoxicillin 500 MG tablet Commonly known as: AMOXIL Take 4 tablets (2,000 mg total) by mouth as directed. 1 hour prior to dental work including cleanings   aspirin EC 81 MG tablet Take 1 tablet (81 mg total) by mouth daily. Swallow whole. Start taking on: June 17, 2023 What changed: These instructions start on June 17, 2023. If you are unsure what to do until then, ask your doctor or other care provider.   colchicine 0.6 MG tablet Take 1 tablet (0.6 mg total) by mouth 2 (two) times daily for 5 days.   fexofenadine 180 MG tablet Commonly known as: ALLEGRA Take 180 mg by mouth daily as needed for allergies.   fluticasone 50 MCG/ACT nasal spray Commonly known as: FLONASE Place 2 sprays into both nostrils daily as needed for allergies.   furosemide 20 MG tablet Commonly known as: LASIX Take 1 tablet (20 mg total) by mouth 3 (three) times a week. Start taking on: June 15, 2023 What changed:  when to take this These instructions start on June 15, 2023. If you are unsure what to do until then, ask your doctor or other care provider.   levothyroxine 88 MCG tablet Commonly known as: SYNTHROID Take 1 tablet (88 mcg total) by mouth daily before breakfast. What changed:  medication strength how much to take   meclizine 25 MG tablet Commonly known as: ANTIVERT Take 1 tablet (25 mg total) by mouth 3 (three) times daily as needed for dizziness.   pantoprazole 40 MG tablet Commonly known as: Protonix Take 1 tablet (40 mg total) by mouth 2 (two) times daily. What changed: when to take this   PRESERVISION AREDS 2 PO Take 1 tablet by mouth in the morning  and at bedtime.   rosuvastatin 5 MG tablet Commonly known as: CRESTOR Take 5 mg by mouth at bedtime.   Vitamin D 125 MCG (5000 UT) Caps Take 5,000 Units by mouth at bedtime.        Follow-up Information     Graciella Freer, PA-C Follow up.   Specialty: Cardiology Why: on 3/13 at 9 am for post pacemaker check Contact information: [address] [place] [address] [phone]         Irena Reichmann, DO. Schedule an appointment as soon as possible for a visit in 2 week(s).   Specialty: Family Medicine Why: for hospital follow-up Contact information: [address] [place] [address] [place] [address] [phone]                Discharge Exam: S:  chest discomfort this morning, otherwise no acute issues.  Per EP cardiology, pain has resolved and patient is now cleared to discharge home.  BP (!) 91/50 (BP Location: Right Arm)   Pulse 69   Temp 98 F (36.7 C) (Oral)   Resp 20   SpO2 94%   Physical Exam General: Alert and oriented x 3, NAD Cardiovascular: S1 S2 clear, RRR.  Respiratory: CTAB Gastrointestinal: Soft, nontender, nondistended, NBS Ext: no pedal edema bilaterally Neuro: no new deficits Psych: Normal affect    Condition at discharge: fair  The results of significant diagnostics from this hospitalization (including imaging, microbiology, ancillary and laboratory) are listed below for reference.   Imaging Studies: DG Chest 2 View Result Date: 06/13/2023 CLINICAL DATA:  [id number] Cardiac device in situ [id number]. EXAM: CHEST - 2 VIEW COMPARISON:  06/11/2023. FINDINGS: Bilateral lung fields are clear. Bilateral costophrenic angles are clear. Stable cardio-mediastinal silhouette. Since the prior study, there is interval placement of new left-sided dual lead cardiac pacemaker with its lead overlying the region of right atrium and right ventricle. No pneumothorax. Redemonstration of pre-existing aortic valve. No acute osseous  abnormalities. The soft tissues are within normal limits. IMPRESSION: No active cardiopulmonary disease. Interval placement of new left-sided dual lead cardiac pacemaker. No pneumothorax. Electronically Signed   By: Jules Schick M.D.   On: 06/13/2023 09:17   DG Chest 2 View Result Date: 06/11/2023 CLINICAL DATA:  Bradycardia. EXAM: CHEST - 2 VIEW COMPARISON:  10/22/2022. FINDINGS: Bilateral lung fields are clear. Bilateral costophrenic angles are clear. Normal cardio-mediastinal silhouette. New aortic valve noted. No acute osseous abnormalities. The soft tissues are within normal limits. There are surgical clips in the right upper quadrant, typical of a previous cholecystectomy. IMPRESSION: No active cardiopulmonary disease. Electronically Signed   By: Jules Schick M.D.   On: 06/11/2023 16:22    Microbiology: Results for orders placed or performed during the hospital encounter of 10/22/22  Surgical pcr screen     Status: Abnormal   Collection Time: 10/22/22  9:26 AM   Specimen: Nasal Mucosa; Nasal Swab  Result Value Ref Range Status   MRSA, PCR NEGATIVE NEGATIVE Final   Staphylococcus aureus POSITIVE (A) NEGATIVE Final    Comment: (NOTE) The Xpert SA Assay (FDA approved for NASAL specimens in patients 58 years of age and older), is one component of a comprehensive surveillance program. It is not intended to diagnose infection nor to guide or monitor treatment. Performed at [facility], [address] [place], [place] [address]   SARS Coronavirus 2 by RT PCR (hospital order, performed in [facility] hospital lab) *cepheid single result test* Anterior Nasal Swab     Status: None   Collection Time: 10/22/22  9:26 AM   Specimen: Anterior Nasal Swab  Result Value Ref Range Status   SARS Coronavirus 2 by RT PCR NEGATIVE NEGATIVE Final    Comment: Performed at [facility], [address], [place], [place] [address]    Labs: CBC: Recent Labs  Lab 06/11/23 1508  06/13/23 0726  WBC 9.8 15.6*  HGB 11.4* 11.2*  HCT 33.7* 33.3*  MCV 85.3 85.2  PLT 198 172   Basic Metabolic Panel: Recent Labs  Lab 06/11/23 1508 06/12/23 1652 06/13/23 0726  NA 130* 135 132*  K 4.3 4.2 4.1  CL 97* 104 102  CO2 22 21* 16*  GLUCOSE 126* 121* 115*  BUN 18 14 19   CREATININE 0.97 0.96 1.12*  CALCIUM 8.9 9.1 8.6*  MG  --  2.2  --   PHOS  --   --  3.7   Liver Function Tests: Recent Labs  Lab 06/12/23 1652 06/13/23 0726  AST 33  --   ALT 19  --   ALKPHOS 67  --   BILITOT 0.7  --   PROT 6.6  --   ALBUMIN 3.5 2.7*   CBG: No results for input(s): "GLUCAP" in the last 168 hours.  Discharge time spent: greater than 30 minutes.  Signed: Thad Ranger, MD [facility] 06/13/2023

## 2023-06-13 NOTE — Discharge Instructions (Addendum)
 After Your Pacemaker   You have a Abbott Pacemaker  ACTIVITY Do not lift your arm above shoulder height for 1 week after your procedure. After 7 days, you may progress as below.  You should remove your sling 24 hours after your procedure, unless otherwise instructed by your provider.     Thursday June 20, 2023  Friday June 21, 2023 Saturday June 22, 2023 Sunday June 23, 2023   Do not lift, push, pull, or carry anything over 10 pounds with the affected arm until 6 weeks (Thursday July 25, 2023 ) after your procedure.   You may drive AFTER your wound check, unless you have been told otherwise by your provider.   Ask your healthcare provider when you can go back to work   INCISION/Dressing If you are on a blood thinner such as Coumadin, Xarelto, Eliquis, Plavix, or Pradaxa please confirm with your provider when this should be resumed.   If large square, outer bandage is left in place, this can be removed after 24 hours from your procedure. Do not remove steri-strips or glue as below.   If a PRESSURE DRESSING (a bulky dressing that usually goes up over your shoulder) was applied or left in place, please follow instructions given by your provider on when to return to have this removed.   Monitor your Pacemaker site for redness, swelling, and drainage. Call the device clinic at [phone] if you experience these symptoms or fever/chills.  If your incision is closed with Dermabond/Surgical glue. You may shower 1 day after your pacemaker implant and wash around the site with soap and water.    If you were discharged in a sling, please do not wear this during the day more than 48 hours after your surgery unless otherwise instructed. This may increase the risk of stiffness and soreness in your shoulder.   Avoid lotions, ointments, or perfumes over your incision until it is well-healed.  You may use a hot tub or a pool AFTER your wound check appointment if the incision is completely  closed.  Pacemaker Alerts:  Some alerts are vibratory and others beep. These are NOT emergencies. Please call our office to let us know. If this occurs at night or on weekends, it can wait until the next business day. Send a remote transmission.  If your device is capable of reading fluid status (for heart failure), you will be offered monthly monitoring to review this with you.   DEVICE MANAGEMENT Plug your monitor into your bedside table.   Remote monitoring is used to monitor your pacemaker from home. This monitoring is scheduled every 91 days by our office. It allows [place] to keep an eye on the functioning of your device to ensure it is working properly. You will routinely see your Electrophysiologist annually (more often if necessary).   You should receive your ID card for your new device in 4-8 weeks. Keep this card with you at all times once received. Consider wearing a medical alert bracelet or necklace.  Your Pacemaker may be MRI compatible. This will be discussed at your next office visit/wound check.  You should avoid contact with strong electric or magnetic fields.   Do not use amateur (ham) radio equipment or electric (arc) welding torches. MP3 player headphones with magnets should not be used. Some devices are safe to use if held at least 12 inches (30 cm) from your Pacemaker. These include power tools, lawn mowers, and speakers. If you are unsure if something is safe to  use, ask your health care provider.  When using your cell phone, hold it to the ear that is on the opposite side from the Pacemaker. Do not leave your cell phone in a pocket over the Pacemaker.  You may safely use electric blankets, heating pads, computers, and microwave ovens.  Call the office right away if: You have chest pain. You feel more short of breath than you have felt before. You feel more light-headed than you have felt before. Your incision starts to open up.  This information is not intended to  replace advice given to you by your health care provider. Make sure you discuss any questions you have with your health care provider.

## 2023-06-13 NOTE — Progress Notes (Signed)
 [facility]                                                                              Margaret Lucas, is a 89 y.o. female, DOB - 04/05/1935, EAV:[id number] Admit date - 06/11/2023    Outpatient Primary MD for the patient is Irena Reichmann, DO  LOS - 1  days  Chief Complaint  Patient presents with   Low Heart Rate       Brief summary   Patient is a 89 year old female with hypothyroidism, HTN, HLD, T2DM, AS status post TAVR presented to ED for evaluation of near syncope.  Patient reported worsening fatigue and several episodes of near syncope in the last 2 weeks.  She checked her BP and noted that it was high however her heart rate was low, less than 50 and lowest at home was 42. She called her cardiologist and was advised to come to the urgent care.  At the urgent care she was found to be bradycardiac and sent her directly to ED. EKG showed junctional escape rhythm with rate 42, cardiology was consulted and recommended EP evaluation for possible pacemaker. TSH 0.242, free T41.2.  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 -This morning, having midsternal chest pain, sitting upright.  EP cardiology aware and may need lead revision due to concern for possible microperforation of atrial lead.  Active Problems:   Hypothyroidism, unspecified -TSH 0.242, free T41.25 -Patient noted to be on levothyroxine 100 mcg daily outpatient, reduced to 88 mcg daily.   -Repeat thyroid studies in 6 to 8 weeks.  Essential hypertension -Amlodipine discontinued. -BP currently soft, continue IV fluids at 75 cc an hour, hold lisinopril   Mild AKI, mild hyponatremia -Creatinine 1.1 this morning, sodium 132 -Placed on normal  saline IV fluid hydration, hold lisinopril   GERD -Continue Protonix    HLD -Continue Crestor   Diabetes mellitus type 2, diet controlled -Will check hemoglobin A1c -CBGs stable   Estimated body mass index is 20.19 kg/m as calculated from the following:   Height as of an earlier encounter on 06/11/23: 5\' 3"  (1.6 m).   Weight as of an earlier encounter on 06/11/23: 51.7 kg.  Code Status:  DVT Prophylaxis:  SCDs Start: 06/12/23 1542 SCDs Start: 06/12/23 0503   Level of Care: Level of care: Progressive Family Communication: Updated patient Disposition Plan:      Remains inpatient appropriate:      Procedures:  Pacemaker placement  Consultants:   Cardiology, EP cardiology  Antimicrobials:   Anti-infectives (From admission, onward)    Start     Dose/Rate Route Frequency Ordered Stop   06/12/23 1841  ceFAZolin (ANCEF) IVPB 1 g/50 mL premix        1 g 100 mL/hr over 30 Minutes Intravenous Every 6 hours 06/12/23 1541 06/13/23 0723   06/12/23 1309  sodium chloride 0.9 % with gentamicin (GARAMYCIN) ADS Med  Note to Pharmacy: Zadie Cleverly B: cabinet override      06/12/23 1309 06/13/23 0114   06/12/23 1308  ceFAZolin (ANCEF) 2-4 GM/100ML-% IVPB       Note to Pharmacy: Zadie Cleverly B: cabinet override      06/12/23 1308 06/13/23 0114   06/12/23 0930  gentamicin (GARAMYCIN) 80 mg in sodium chloride 0.9 % 500 mL irrigation        80 mg Irrigation On call 06/12/23 0920 06/12/23 1458   06/12/23 0930  ceFAZolin (ANCEF) IVPB 2g/100 mL premix        2 g 200 mL/hr over 30 Minutes Intravenous On call 06/12/23 0920 06/12/23 1458          Medications  levothyroxine  88 mcg Oral Q0600   lisinopril  40 mg Oral Daily   pantoprazole  40 mg Oral Q1500   rosuvastatin  5 mg Oral QHS      Subjective:   Margaret Lucas was seen and examined today.  Pacemaker placed on 2/26, complaining of chest discomfort, sitting upright.  No acute abdominal pain, nausea vomiting no  fevers.  BP soft  Objective:   Vitals:   06/12/23 2300 06/13/23 0300 06/13/23 0400 06/13/23 0754  BP: (!) 100/46 (!) 103/44 (!) 105/51 (!) 103/50  Pulse: 71 73 67 77  Resp: 20 20 20  (!) 22  Temp: 97.7 F (36.5 C)  98 F (36.7 C) 97.9 F (36.6 C)  TempSrc: Oral  Oral Oral  SpO2: 97% 91% 94% 97%    Intake/Output Summary (Last 24 hours) at 06/13/2023 1122 Last data filed at 06/13/2023 0757 Gross per 24 hour  Intake 1355.03 ml  Output --  Net 1355.03 ml     Wt Readings from Last 3 Encounters:  06/11/23 51.7 kg  01/25/23 51.7 kg  12/05/22 51.9 kg     Exam General: Alert and oriented x 3, NAD, uncomfortable Cardiovascular: S1 S2 auscultated,  RRR Respiratory: Clear to auscultation bilaterally, no wheezing, rales or rhonchi Gastrointestinal: Soft, nontender, nondistended, + bowel sounds Ext: no pedal edema bilaterally Neuro: no new deficits Psych: Normal affect     Data Reviewed:  I have personally reviewed following labs    CBC Lab Results  Component Value Date   WBC 15.6 (H) 06/13/2023   RBC 3.91 06/13/2023   HGB 11.2 (L) 06/13/2023   HCT 33.3 (L) 06/13/2023   MCV 85.2 06/13/2023   MCH 28.6 06/13/2023   PLT 172 06/13/2023   MCHC 33.6 06/13/2023   RDW 13.0 06/13/2023   LYMPHSABS 2.2 09/17/2022   MONOABS 0.9 02/23/2020   EOSABS 0.1 09/17/2022   BASOSABS 0.1 09/17/2022     Last metabolic panel Lab Results  Component Value Date   NA 132 (L) 06/13/2023   K 4.1 06/13/2023   CL 102 06/13/2023   CO2 16 (L) 06/13/2023   BUN 19 06/13/2023   CREATININE 1.12 (H) 06/13/2023   GLUCOSE 115 (H) 06/13/2023   GFRNONAA 48 (L) 06/13/2023   GFRAA >60 08/11/2016   CALCIUM 8.6 (L) 06/13/2023   PHOS 3.7 06/13/2023   PROT 6.6 06/12/2023   ALBUMIN 2.7 (L) 06/13/2023   LABGLOB 2.9 10/23/2022   BILITOT 0.7 06/12/2023   ALKPHOS 67 06/12/2023   AST 33 06/12/2023   ALT 19 06/12/2023   ANIONGAP 14 06/13/2023    CBG (last 3)  No results for input(s): "GLUCAP" in the  last 72 hours.    Coagulation Profile: No results for input(s): "INR", "PROTIME" in the last  168 hours.   Radiology Studies: I have personally reviewed the imaging studies  DG Chest 2 View Result Date: 06/13/2023 CLINICAL DATA:  [id number] Cardiac device in situ [id number]. EXAM: CHEST - 2 VIEW COMPARISON:  06/11/2023. FINDINGS: Bilateral lung fields are clear. Bilateral costophrenic angles are clear. Stable cardio-mediastinal silhouette. Since the prior study, there is interval placement of new left-sided dual lead cardiac pacemaker with its lead overlying the region of right atrium and right ventricle. No pneumothorax. Redemonstration of pre-existing aortic valve. No acute osseous abnormalities. The soft tissues are within normal limits. IMPRESSION: No active cardiopulmonary disease. Interval placement of new left-sided dual lead cardiac pacemaker. No pneumothorax. Electronically Signed   By: Jules Schick M.D.   On: 06/13/2023 09:17   DG Chest 2 View Result Date: 06/11/2023 CLINICAL DATA:  Bradycardia. EXAM: CHEST - 2 VIEW COMPARISON:  10/22/2022. FINDINGS: Bilateral lung fields are clear. Bilateral costophrenic angles are clear. Normal cardio-mediastinal silhouette. New aortic valve noted. No acute osseous abnormalities. The soft tissues are within normal limits. There are surgical clips in the right upper quadrant, typical of a previous cholecystectomy. IMPRESSION: No active cardiopulmonary disease. Electronically Signed   By: Jules Schick M.D.   On: 06/11/2023 16:22       Azoria Abbett M.D. [facility] 06/13/2023, 11:22 AM  Available via Epic secure chat 7am-7pm After 7 pm, please refer to night coverage provider listed on amion.

## 2023-06-13 NOTE — Progress Notes (Addendum)
  Patient Name: Margaret Lucas Date of Encounter: 06/13/2023  Primary Cardiologist: Peter [place], MD Electrophysiologist: New  Interval Summary   Continues to have discomfort that is relieved with sitting up.   Vital Signs    Vitals:   06/12/23 2300 06/13/23 0300 06/13/23 0400 06/13/23 0754  BP: (!) 100/46 (!) 103/44 (!) 105/51 (!) 103/50  Pulse: 71 73 67 77  Resp: 20 20 20  (!) 22  Temp: 97.7 F (36.5 C)  98 F (36.7 C) 97.9 F (36.6 C)  TempSrc: Oral  Oral Oral  SpO2: 97% 91% 94% 97%    Intake/Output Summary (Last 24 hours) at 06/13/2023 1030 Last data filed at 06/12/2023 2019 Gross per 24 hour  Intake 1355.03 ml  Output --  Net 1355.03 ml   There were no vitals filed for this visit.  Physical Exam    GEN- The patient is well appearing, alert and oriented x 3 today.   Lungs- Clear to ausculation bilaterally, normal work of breathing Cardiac- Regular rate and rhythm, no murmurs, rubs or gallops GI- soft, NT, ND, + BS Extremities- no clubbing or cyanosis. No edema  Telemetry    V pacing 60-70s (personally reviewed)  Hospital Course    BETSY ROSELLO is a 89 y.o. female admitted for CHB with junctional escape.  Assessment & Plan    CHB Junctional escape (RBBB)  Baseline bifasicular block with PR interval of >220 and QRS 132 (RBBB) No reversible causes identified.  Echo 11/2022 with normal EF Mild, non-obstructive CAD pre TAVR 09/2022 PYP scan 11/2022 low risk/equivocal  S/p Abbott Dual Chamber PPM 2/26.  Pain this am felt consistent with RA lead microperf. Explained risks, benefits, and alternatives to lead revision, including but not limited to bleeding, infection, pneumothorax, pericardial effusion, lead dislodgement, heart attack, stroke, or death.  Pt verbalized understanding and agrees to proceed.    Not on OAC   HTN Follow.   AS s/p TAVR Excellent recovery and stable on post Echo.   MD to see. May be Dr. Lalla Brothers or Dr. Jimmey Ralph pending lab progress  through the day.   For questions or updates, please contact [facility] Please consult [email or web address] for contact info under Cardiology/STEMI.  Signed, Graciella Freer, PA-C  06/13/2023, 10:30 AM   ADDENDUM  Seen with Dr. Lalla Brothers. Pain has completely resolved, none with lying flat or deep breathing.   Bedside echo by Dr. Lalla Brothers with no effusion. Would plan for home with colchicine for prophylaxis, and usual follow up otherwise.   Casimiro Needle [address]" [place], PA-C  06/13/2023 1:22 PM

## 2023-06-13 NOTE — Progress Notes (Signed)
 Mobility Specialist Progress Note:   06/13/23 1032  Mobility  Activity Transferred from bed to chair  Level of Assistance Minimal assist, patient does 75% or more  Assistive Device None  Distance Ambulated (ft) 4 ft  Activity Response Tolerated well  Mobility Referral Yes  Mobility visit 1 Mobility  Mobility Specialist Start Time (ACUTE ONLY) 1025  Mobility Specialist Stop Time (ACUTE ONLY) 1030  Mobility Specialist Time Calculation (min) (ACUTE ONLY) 5 min   Pt requested to transfer B>C d/t attempts to relieve CP while inhaling. Required MinA to sit EOB, SBA while standing. No AD required to transfer to chair. Pt has LUE arm sling for pacemaker precautions. Assisted pt with donning. Tolerated transfer well, asx throughout. Pt reported inhalation was improved while sitting up. Left pt in care of PA. All needs met, alarm on, call bell in reach.   Feliciana Rossetti Mobility Specialist Please contact via Special educational needs teacher or  Rehab office at [phone]

## 2023-06-13 NOTE — Progress Notes (Signed)
  In to see pt with Dr. Lalla Brothers. Pain has resolved and is not brought on by very deep breathing or position changes.  She states she has also not had GERD meds since Monday.   Will treat with prophylactic colchicine x 5 days and plan discharge today.   Casimiro Needle [address] [place], PA-C  06/13/2023 1:16 PM

## 2023-06-13 NOTE — Progress Notes (Signed)
   Pt with midsternal chest pain this am.  Reassuring it is non-pleuritic, but concerning for micro-perforation of atrial lead as it improves with sitting up.   Will leave NPO and hydrate, and check back in several hours.    If discomfort has abated, will discuss risk/benefit of lead repositioning.  If discomfort continues or worsens, would plan atrial lead revision later today.   Casimiro Needle [address] [place], [place]  06/13/2023 8:44 AM

## 2023-06-13 NOTE — Progress Notes (Signed)
 Pt states she feels "something" when she takes a deep breath but not necessarily pain. Her current pain level is 0/10. Notified [place], PA.

## 2023-06-14 ENCOUNTER — Telehealth: Payer: Self-pay | Admitting: *Deleted

## 2023-06-14 NOTE — Transitions of Care (Post Inpatient/ED Visit) (Signed)
   06/14/2023  Name: Margaret Lucas MRN: [id number] DOB: [birth year]  Today's TOC FU Call Status: Today's TOC FU Call Status:: Unsuccessful Call (1st Attempt) Unsuccessful Call (1st Attempt) Date: 06/14/23  Attempted to reach the patient regarding the most recent Inpatient/ED visit.  Follow Up Plan: Additional outreach attempts will be made to reach the patient to complete the Transitions of Care (Post Inpatient/ED visit) call.   Irving Shows [facility], BSN RN Care Manager/ Transition of Care [place]/ [facility] [phone]

## 2023-06-17 ENCOUNTER — Telehealth: Payer: Self-pay | Admitting: *Deleted

## 2023-06-17 NOTE — Transitions of Care (Post Inpatient/ED Visit) (Signed)
   06/17/2023  Name: Margaret Lucas MRN: [id number] DOB: [birth year]  Today's TOC FU Call Status: Today's TOC FU Call Status:: Unsuccessful Call (2nd Attempt) Unsuccessful Call (2nd Attempt) Date: 06/17/23  Attempted to reach the patient regarding the most recent Inpatient/ED visit.  Follow Up Plan: Additional outreach attempts will be made to reach the patient to complete the Transitions of Care (Post Inpatient/ED visit) call.   Gean Maidens BSN RN [facility] Health Care Management Coordinator [email or web address] Direct Dial: [phone]  Fax: [phone] Website: [email or web address]

## 2023-06-18 ENCOUNTER — Telehealth: Payer: Self-pay | Admitting: *Deleted

## 2023-06-18 NOTE — Transitions of Care (Post Inpatient/ED Visit) (Signed)
 06/18/2023  Name: Margaret Lucas MRN: [id number] DOB: Aug 18, 1934  Today's TOC FU Call Status: Today's TOC FU Call Status:: Successful TOC FU Call Completed TOC FU Call Complete Date: 06/18/23 Patient's Name and Date of Birth confirmed.  Transition Care Management Follow-up Telephone Call Date of Discharge: 06/13/23 Discharge Facility: [facility]) Type of Discharge: Inpatient Admission Primary Inpatient Discharge Diagnosis:: Complete Heart Block How have you been since you were released from the hospital?: Better (I am having some hoarseness/ ears feel tight and some loose bowels. Patient has contacted the DR and they told her to take extra immodium. RN explained to drink something warm to soothe her throat) Any questions or concerns?: Yes Patient Questions/Concerns:: Patient wanted to know if she should leave the monitor plugged in. Patient Questions/Concerns Addressed: Other: (RN stated yes it is to be left plugged in)  Items Reviewed: Did you receive and understand the discharge instructions provided?: Yes Medications obtained,verified, and reconciled?: Yes (Medications Reviewed) Any new allergies since your discharge?: No Dietary orders reviewed?: No Do you have support at home?: Yes People in Home: child(ren), adult Name of Support/Comfort Primary Source: Margaret Lucas  Medications Reviewed Today: Medications Reviewed Today     Reviewed by Luella Cook, RN (Case Manager) on 06/18/23 at 1404  Med List Status: <None>   Medication Order Taking? Sig Documenting Provider Last Dose Status Informant  acetaminophen (TYLENOL) 325 MG tablet [id number] Yes Take 2 tablets (650 mg total) by mouth every 6 (six) hours as needed for mild pain (pain score 1-3) (or Fever >/= 101). Graciella Freer, PA-C Taking Active   amoxicillin (AMOXIL) 500 MG tablet [id number]  Take 4 tablets (2,000 mg total) by mouth as directed. 1 hour prior to dental work including cleanings Janee Morn, Wille Celeste, PA-C  Active Self, Pharmacy Records           Med Note (Lucas, Margaret C   Wed Jun 12, 2023  8:27 AM) Pt is unsure of last dose.   aspirin EC 81 MG tablet [id number] Yes Take 1 tablet (81 mg total) by mouth daily. Swallow whole. Graciella Freer, PA-C Taking Active   Cholecalciferol (VITAMIN D) 125 MCG (5000 UT) CAPS [id number] Yes Take 5,000 Units by mouth at bedtime. [provider] Taking Active Self, Pharmacy Records  colchicine 0.6 MG tablet [id number] Yes Take 1 tablet (0.6 mg total) by mouth 2 (two) times daily for 5 days. Graciella Freer, PA-C Taking Active   fexofenadine [facility]) 180 MG tablet [id number] Yes Take 180 mg by mouth daily as needed for allergies. [provider] Taking Active Self, Pharmacy Records           Med Note (Lucas, Margaret C   Wed Jun 12, 2023  8:27 AM) Pt is unsure of last dose.   fluticasone (FLONASE) 50 MCG/ACT nasal spray [id number] Yes Place 2 sprays into both nostrils daily as needed for allergies. [provider] Taking Active Self, Pharmacy Records           Med Note (Lucas, Margaret C   Wed Jun 12, 2023  8:27 AM) Pt is unsure of last dose.   furosemide (LASIX) 20 MG tablet [id number]  Take 1 tablet (20 mg total) by mouth 3 (three) times a week. Rai, Delene Ruffini, MD  Active   levothyroxine (SYNTHROID) 88 MCG tablet [id number] Yes Take 1 tablet (88 mcg total) by mouth daily before breakfast. Rai, Ripudeep K, MD Taking Active   lisinopril (ZESTRIL) 40 MG tablet  [id number] Yes Take 1 tablet (40 mg total) by mouth daily. Orbie Pyo, MD Taking Active Self, Pharmacy Records  meclizine (ANTIVERT) 25 MG tablet [id number] Yes Take 1 tablet (25 mg total) by mouth 3 (three) times daily as needed for dizziness. Long, Arlyss Repress, MD Taking Active Self, Pharmacy Records           Med Note (Lucas, Margaret C   Wed Jun 12, 2023  8:28 AM) Pt is unsure of last dose.   Multiple Vitamins-Minerals (PRESERVISION AREDS 2 PO) [id number] Yes Take  1 tablet by mouth in the morning and at bedtime. [provider] Taking Active Self, Pharmacy Records  pantoprazole (PROTONIX) 40 MG tablet [id number] Yes Take 1 tablet (40 mg total) by mouth 2 (two) times daily.  Patient taking differently: Take 40 mg by mouth daily in the afternoon.   Pearson Grippe, MD Taking Active Self, Pharmacy Records  rosuvastatin (CRESTOR) 5 MG tablet [id number] Yes Take 5 mg by mouth at bedtime. [provider] Taking Active Self, Pharmacy Records            Home Care and Equipment/Supplies: Were Home Health Services Ordered?: NA Any new equipment or medical supplies ordered?: NA  Functional Questionnaire: Do you need assistance with bathing/showering or dressing?: Yes Do you need assistance with meal preparation?: Yes Do you need assistance with eating?: No Do you have difficulty maintaining continence: No Do you need assistance with getting out of bed/getting out of a chair/moving?: No Do you have difficulty managing or taking your medications?: No  Follow up appointments reviewed: PCP Follow-up appointment confirmed?: Yes Date of PCP follow-up appointment?: 07/01/23 Follow-up Provider: Irena Reichmann Specialist [facility] Follow-up appointment confirmed?: Yes Date of Specialist follow-up appointment?: 06/27/23 Follow-Up Specialty Provider:: Maxine Glenn PA Do you need transportation to your follow-up appointment?: No Do you understand care options if your condition(s) worsen?: Yes-patient verbalized understanding  SDOH Interventions Today    Flowsheet Row Most Recent Value  SDOH Interventions   Food Insecurity Interventions Intervention Not Indicated  Housing Interventions Intervention Not Indicated  Transportation Interventions Intervention Not Indicated, Patient Resources (Friends/Family)  Utilities Interventions Intervention Not Indicated      Interventions Today    Flowsheet Row Most Recent Value  Chronic Disease   Chronic  disease during today's visit Other  [complete heart block]  General Interventions   General Interventions Discussed/Reviewed General Interventions Discussed, Doctor Visits  Doctor Visits Discussed/Reviewed Doctor Visits Discussed, Doctor Visits Reviewed, PCP, Specialist  PCP/Specialist Visits Compliance with follow-up visit  Exercise Interventions   Exercise Discussed/Reviewed Physical Activity  [Do not lift your arm above shoulder height for 1 week after your procedure]  Physical Activity Discussed/Reviewed Physical Activity Discussed  Education Interventions   Education Provided Provided Education  Provided Verbal Education On Other, Medication  [" Plug your monitor into your bedside table. Do not turn it off and onn repeatedly.This can damage the delicate circuitry within the monitor. Monitor you blood pressure.resume lisinopril on 06/15/2023 if BP allows]  Nutrition Interventions   Nutrition Discussed/Reviewed Nutrition Discussed, Nutrition Reviewed, Fluid intake  Pharmacy Interventions   Pharmacy Dicussed/Reviewed Pharmacy Topics Discussed, Pharmacy Topics Reviewed      Patient declined TOC follow up services  Gean Maidens BSN RN [facility] Health Care Management Coordinator [email or web address] Direct Dial: [phone]  Fax: [phone] Website: [email or web address]

## 2023-06-18 NOTE — Transitions of Care (Post Inpatient/ED Visit) (Signed)
   06/18/2023  Name: Margaret Lucas MRN: [id number] DOB: Aug 24, 1934  Today's TOC FU Call Status: Today's TOC FU Call Status:: Unsuccessful Call (3rd Attempt) Unsuccessful Call (3rd Attempt) Date: 06/18/23  Attempted to reach the patient regarding the most recent Inpatient/ED visit.  Follow Up Plan: No further outreach attempts will be made at this time. We have been unable to contact the patient.  Gean Maidens BSN RN [facility] Health Care Management Coordinator [email or web address] Direct Dial: [phone]  Fax: [phone] Website: [email or web address]

## 2023-06-19 ENCOUNTER — Telehealth: Payer: Self-pay | Admitting: Internal Medicine

## 2023-06-19 NOTE — Telephone Encounter (Signed)
 Patient called stating she recently had a pace maker implanted.  She states she has noticed a change in her breathing when she is laying down.  When she is up and about she doesn't notice any change.  She said it's not SOB.  She doesn't know if it has anything to do with her allergies as she does have allergies.

## 2023-06-19 NOTE — Telephone Encounter (Signed)
 Spoke to patient and she states that she has a dry cough and she is concerned that she is having cardiac issues or issues with her pacemaker. Pt reports that she had a cough before her pacemaker. She admits that her PCP is treating her for allergies due to her cough. Pt states that she has some swelling in her hands, but none in her legs. Pt reports that she does get slightly out of breath getting in bed. Pt advised to continue to monitor symptoms and make sure to follow up with PCP.

## 2023-06-21 DIAGNOSIS — H353231 Exudative age-related macular degeneration, bilateral, with active choroidal neovascularization: Secondary | ICD-10-CM | POA: Diagnosis not present

## 2023-06-24 ENCOUNTER — Other Ambulatory Visit (HOSPITAL_COMMUNITY): Payer: Self-pay

## 2023-06-26 ENCOUNTER — Other Ambulatory Visit (HOSPITAL_COMMUNITY): Payer: Self-pay

## 2023-06-26 NOTE — Progress Notes (Unsigned)
  Electrophysiology Office Note:   ID:  Unique, Sillas [birth year], MRN [id number]  Primary Cardiologist: Peter Swaziland, MD Electrophysiologist: Sherryl Manges, MD  {Click to update primary MD,subspecialty MD or APP then REFRESH:1}    History of Present Illness:   JAIDYNN BALSTER is a 89 y.o. female with h/o hypothyroid, HTN, HLD, DM2, TAVR, and CHB s/p PPM seen today for post hospital follow up.    Admitted 06/11/2023 with new CHB, and underwent PPM implantation.  She had mild chest discomfort and was observed through the next afternoon. It improved with food and she reported being off her GERD meds. Denied worsening with deep breathing or position changes. Was given prophylactic colchicine at discharge.   Since discharge from hospital the patient reports doing ***.  she denies chest pain, palpitations, dyspnea, PND, orthopnea, nausea, vomiting, dizziness, syncope, edema, weight gain, or early satiety.   Review of systems complete and found to be negative unless listed in HPI.   EP Information / Studies Reviewed:    EKG is ordered today. Personal review as below.       PPM Interrogation-  reviewed in detail today,  See PACEART report.  Arrhythmia/Device History Abbott Dual Chamber PPM 06/12/2023 for CHB   Physical Exam:   VS:  There were no vitals taken for this visit.   Wt Readings from Last 3 Encounters:  06/11/23 113 lb 15.7 oz (51.7 kg)  01/25/23 114 lb (51.7 kg)  12/05/22 114 lb 6.4 oz (51.9 kg)     GEN: No acute distress  NECK: No JVD; No carotid bruits CARDIAC: {EPRHYTHM:28826}, no murmurs, rubs, gallops RESPIRATORY:  Clear to auscultation without rales, wheezing or rhonchi  ABDOMEN: Soft, non-tender, non-distended EXTREMITIES:  {EDEMA LEVEL:28147::"No"} edema; No deformity   ASSESSMENT AND PLAN:    CHB s/p Abbott PPM  Normal PPM function See Pace Art report No changes today  H/o TAVR Stable prior echo  HTN Stable on current regimen   DM2 Per  PCP  {Click here to Review PMH, Prob List, Meds, Allergies, SHx, FHx  :1}   Disposition:   Follow up with {EPPROVIDERS:28135} {EPFOLLOW UP:28173}  Signed, Graciella Freer, PA-C

## 2023-06-27 ENCOUNTER — Encounter: Payer: Self-pay | Admitting: Student

## 2023-06-27 ENCOUNTER — Ambulatory Visit: Payer: Medicare PPO | Attending: Student | Admitting: Student

## 2023-06-27 VITALS — BP 152/60 | HR 63 | Ht 63.0 in | Wt 113.8 lb

## 2023-06-27 DIAGNOSIS — I152 Hypertension secondary to endocrine disorders: Secondary | ICD-10-CM

## 2023-06-27 DIAGNOSIS — Z952 Presence of prosthetic heart valve: Secondary | ICD-10-CM | POA: Diagnosis not present

## 2023-06-27 DIAGNOSIS — E1159 Type 2 diabetes mellitus with other circulatory complications: Secondary | ICD-10-CM | POA: Diagnosis not present

## 2023-06-27 DIAGNOSIS — I10 Essential (primary) hypertension: Secondary | ICD-10-CM

## 2023-06-27 DIAGNOSIS — E119 Type 2 diabetes mellitus without complications: Secondary | ICD-10-CM

## 2023-06-27 DIAGNOSIS — I442 Atrioventricular block, complete: Secondary | ICD-10-CM | POA: Diagnosis not present

## 2023-06-27 DIAGNOSIS — I350 Nonrheumatic aortic (valve) stenosis: Secondary | ICD-10-CM | POA: Diagnosis not present

## 2023-06-27 LAB — CUP PACEART INCLINIC DEVICE CHECK
Battery Remaining Longevity: 81 mo
Battery Voltage: 3.01 V
Brady Statistic RA Percent Paced: 40 %
Brady Statistic RV Percent Paced: 99.55 %
Date Time Interrogation Session: 20250313092420
Implantable Lead Connection Status: 753985
Implantable Lead Connection Status: 753985
Implantable Lead Implant Date: 20250226
Implantable Lead Implant Date: 20250226
Implantable Lead Location: 753859
Implantable Lead Location: 753860
Implantable Lead Model: 3830
Implantable Pulse Generator Implant Date: 20250226
Lead Channel Impedance Value: 487.5 Ohm
Lead Channel Impedance Value: 600 Ohm
Lead Channel Pacing Threshold Amplitude: 0.5 V
Lead Channel Pacing Threshold Amplitude: 0.5 V
Lead Channel Pacing Threshold Amplitude: 0.625 V
Lead Channel Pacing Threshold Pulse Width: 0.5 ms
Lead Channel Pacing Threshold Pulse Width: 0.5 ms
Lead Channel Pacing Threshold Pulse Width: 0.5 ms
Lead Channel Sensing Intrinsic Amplitude: 12 mV
Lead Channel Sensing Intrinsic Amplitude: 2.4 mV
Lead Channel Setting Pacing Amplitude: 1.625
Lead Channel Setting Pacing Amplitude: 3.5 V
Lead Channel Setting Pacing Pulse Width: 0.5 ms
Lead Channel Setting Sensing Sensitivity: 4 mV
Pulse Gen Model: 2272
Pulse Gen Serial Number: 8241865

## 2023-06-27 NOTE — Patient Instructions (Signed)
 Medication Instructions:  Your physician recommends that you continue on your current medications as directed. Please refer to the Current Medication list given to you today.  *If you need a refill on your cardiac medications before your next appointment, please call your pharmacy*  Lab Work: None ordered If you have labs (blood work) drawn today and your tests are completely normal, you will receive your results only by: MyChart Message (if you have MyChart) OR A paper copy in the mail If you have any lab test that is abnormal or we need to change your treatment, we will call you to review the results.  Follow-Up: At [facility], you and your health needs are our priority.  As part of our continuing mission to provide you with exceptional heart care, we have created designated Provider Care Teams.  These Care Teams include your primary Cardiologist (physician) and Advanced Practice Providers (APPs -  Physician Assistants and Nurse Practitioners) who all work together to provide you with the care you need, when you need it.  Your next appointment:   As scheduled

## 2023-07-01 DIAGNOSIS — Z87898 Personal history of other specified conditions: Secondary | ICD-10-CM | POA: Diagnosis not present

## 2023-07-01 DIAGNOSIS — Z950 Presence of cardiac pacemaker: Secondary | ICD-10-CM | POA: Diagnosis not present

## 2023-07-01 DIAGNOSIS — Z952 Presence of prosthetic heart valve: Secondary | ICD-10-CM | POA: Diagnosis not present

## 2023-07-01 DIAGNOSIS — Z79899 Other long term (current) drug therapy: Secondary | ICD-10-CM | POA: Diagnosis not present

## 2023-07-01 DIAGNOSIS — Z09 Encounter for follow-up examination after completed treatment for conditions other than malignant neoplasm: Secondary | ICD-10-CM | POA: Diagnosis not present

## 2023-07-01 DIAGNOSIS — Z9109 Other allergy status, other than to drugs and biological substances: Secondary | ICD-10-CM | POA: Diagnosis not present

## 2023-07-01 DIAGNOSIS — E039 Hypothyroidism, unspecified: Secondary | ICD-10-CM | POA: Diagnosis not present

## 2023-07-01 DIAGNOSIS — I10 Essential (primary) hypertension: Secondary | ICD-10-CM | POA: Diagnosis not present

## 2023-07-01 DIAGNOSIS — I442 Atrioventricular block, complete: Secondary | ICD-10-CM | POA: Diagnosis not present

## 2023-07-08 ENCOUNTER — Telehealth: Payer: Self-pay | Admitting: Internal Medicine

## 2023-07-08 NOTE — Telephone Encounter (Signed)
 Remote transmission received. Patient is in NSR on remote transmission. Appears frequent PMT events noted on several different days. Patient will need device clinic apt to see if device can be optimized to reduce PMT events.

## 2023-07-08 NOTE — Telephone Encounter (Signed)
 Patient is calling to let the doctor know that her blood pressure has gotten better but heart rate has increased so want to make sure that is ok. Also she wants to know if she can go out of town. Please call back to confirm

## 2023-07-08 NOTE — Telephone Encounter (Signed)
 Pt is calling in to report how her current BP/HR readings are running, and to get further assistance with her pacemaker question, and how this can be monitored on her trip to the [employer].  Pt states her pressures have been running in the low 130s/50-60s, and her heart rates are running in the 70s now.  She wants to make sure her rates are acceptable with her current pacemaker device.   She states she has a pacemaker and was [place] and if [place], what would she need to do to have [place] pacemaker device [place].  Informed the pt that I will route this message to our device team to further assist her with this matter.   Pt verbalized understanding and agrees with this plan.

## 2023-07-10 ENCOUNTER — Ambulatory Visit: Attending: Cardiology

## 2023-07-10 DIAGNOSIS — I442 Atrioventricular block, complete: Secondary | ICD-10-CM

## 2023-07-10 LAB — CUP PACEART INCLINIC DEVICE CHECK
Date Time Interrogation Session: 20250326091104
Implantable Lead Connection Status: 753985
Implantable Lead Connection Status: 753985
Implantable Lead Implant Date: 20250226
Implantable Lead Implant Date: 20250226
Implantable Lead Location: 753859
Implantable Lead Location: 753860
Implantable Lead Model: 3830
Implantable Pulse Generator Implant Date: 20250226
Pulse Gen Model: 2272
Pulse Gen Serial Number: 8241865

## 2023-07-10 NOTE — Progress Notes (Signed)
 Patient seen in device clinic to adjust to review data information for frequent PMT events.   V-A conduction . PVARP extended to .

## 2023-07-18 DIAGNOSIS — H353221 Exudative age-related macular degeneration, left eye, with active choroidal neovascularization: Secondary | ICD-10-CM | POA: Diagnosis not present

## 2023-07-18 DIAGNOSIS — Z961 Presence of intraocular lens: Secondary | ICD-10-CM | POA: Diagnosis not present

## 2023-07-18 DIAGNOSIS — C441122 Basal cell carcinoma of skin of right lower eyelid, including canthus: Secondary | ICD-10-CM | POA: Diagnosis not present

## 2023-07-18 DIAGNOSIS — Z01818 Encounter for other preprocedural examination: Secondary | ICD-10-CM | POA: Diagnosis not present

## 2023-07-26 ENCOUNTER — Ambulatory Visit (INDEPENDENT_AMBULATORY_CARE_PROVIDER_SITE_OTHER): Payer: Medicare PPO

## 2023-07-26 DIAGNOSIS — I442 Atrioventricular block, complete: Secondary | ICD-10-CM | POA: Diagnosis not present

## 2023-07-26 NOTE — Progress Notes (Signed)
 Cardiology Office Note   Date:  07/29/2023   ID:  Margaret Lucas, DOB 12-14-1934, MRN [id number]  PCP:  Pete Brand, DO  Cardiologist:   Dashonda Bonneau Swaziland, MD   Chief Complaint  Patient presents with   Aortic Stenosis      History of Present Illness: Margaret Lucas is a 89 y.o. female who is seen for follow up of aortic stenosis.  She has a history of HTN and DM- diet controlled. She mentions she has a murmur dating back several  years. She had an Echo in 2014 showing some AV sclerosis. Repeat in 2017 showed mild AS but valve velocity was really unchanged. Echo in Jan 2022 showed mild AS without change.   She was admitted in November 2021 with N/V, dehydration, UTI and hyponatremia. HCT held. Follow up sodium level improved.    In May she  developed exertional fatigue, shortness of breath, LE edema as well as chest tightness and dizziness. Repeat echocardiogram on 08/30/2022 showed an EF 60% and moderate AS with a mean grad 30 mmHg, peak grad 51.3 mmHg, DVI 0.98, SVI 61, as well a mild MR & AI. Cardiac catheterization showed nonobstructive coronary disease with a mean gradient across aortic valve of 32.5 mmHg with a valve area of 0.84 cm.    The patient was evaluated by the multidisciplinary valve team and underwent successful TAVR with a 23 mm Edwards Sapien 3 Ultra Resilia 23 THV via the TF approach on 10/23/22. Post operative echo showed EF 65%, normally functioning TAVR with a mean gradient of 11 mmHg and no PVL. She was continued on home aspirin. Post procedure she developed some edema that improved with lasix. She was noted to have a 7 mm right lung nodule and repeat CT recommended. She did have a PET scan for amyloid screening and this was low risk.   Admitted 06/11/2023 with new CHB, and underwent PPM implantation.   On follow up today she is doing very well. Has resumed all her normal activity. No dyspnea, chest pain or palpitations. No edema. Amlodipine stopped when she had CHB  and low BP but later resumed when BP went back up.    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 Lorie Rook & Bartle   Severe aortic stenosis    Upper GI bleed 08/07/2016   Vertigo     Past Surgical History:  Procedure Laterality Date   CHOLECYSTECTOMY     COLONOSCOPY  2010 last   ESOPHAGOGASTRODUODENOSCOPY N/A 08/08/2016   Procedure: ESOPHAGOGASTRODUODENOSCOPY (EGD);  Surgeon: Kenney Peacemaker, MD;  Location: [facility] ENDOSCOPY;  Service: Endoscopy;  Laterality: N/A;   INTRAOPERATIVE TRANSTHORACIC ECHOCARDIOGRAM N/A 10/23/2022   Procedure: INTRAOPERATIVE TRANSTHORACIC ECHOCARDIOGRAM;  Surgeon: Kyra Phy, MD;  Location: [facility] INVASIVE CV LAB;  Service: Open Heart Surgery;  Laterality: N/A;   PACEMAKER IMPLANT N/A 06/12/2023   Procedure: PACEMAKER IMPLANT;  Surgeon: Verona Goodwill, MD;  Location: [facility] INVASIVE CV LAB;  Service: Cardiovascular;  Laterality: N/A;   RIGHT/LEFT HEART CATH AND CORONARY ANGIOGRAPHY N/A 09/21/2022   Procedure: RIGHT/LEFT HEART CATH AND CORONARY ANGIOGRAPHY;  Surgeon: Swaziland, Luisfelipe Engelstad  M, MD;  Location: [facility] INVASIVE CV LAB;  Service: Cardiovascular;  Laterality: N/A;   SKIN CANCER EXCISION     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    TRANSCATHETER AORTIC VALVE REPLACEMENT, TRANSFEMORAL N/A 10/23/2022   Procedure: Transcatheter Aortic Valve Replacement, Transfemoral;  Surgeon: Thukkani, Arun K, MD;  Location: [facility] INVASIVE CV LAB;  Service: Open Heart Surgery;  Laterality: N/A;   TUBAL LIGATION       Current Outpatient Medications  Medication Sig Dispense Refill   amLODipine (NORVASC) 10 MG tablet Take 10 mg by  mouth daily.     aspirin EC 81 MG tablet Take 1 tablet (81 mg total) by mouth daily. Swallow whole.     Cholecalciferol (VITAMIN D) 125 MCG (5000 UT) CAPS Take 5,000 Units by mouth at bedtime.     fluticasone (FLONASE) 50 MCG/ACT nasal spray Place 2 sprays into both nostrils daily as needed for allergies.     furosemide (LASIX) 20 MG tablet Take 1 tablet (20 mg total) by mouth 3 (three) times a week.     levothyroxine (SYNTHROID) 88 MCG tablet Take 1 tablet (88 mcg total) by mouth daily before breakfast. 30 tablet 3   lisinopril (ZESTRIL) 40 MG tablet Take 1 tablet (40 mg total) by mouth daily. 90 tablet 3   Multiple Vitamins-Minerals (PRESERVISION AREDS 2 PO) Take 1 tablet by mouth in the morning and at bedtime.     pantoprazole (PROTONIX) 40 MG tablet Take 1 tablet (40 mg total) by mouth 2 (two) times daily. (Patient taking differently: Take 40 mg by mouth daily.) 60 tablet 0   rosuvastatin (CRESTOR) 5 MG tablet Take 5 mg by mouth at bedtime.     amoxicillin (AMOXIL) 500 MG tablet Take 4 tablets (2,000 mg total) by mouth as directed. 1 hour prior to dental work including cleanings (Patient not taking: Reported on 07/29/2023) 12 tablet 12   No current facility-administered medications for this visit.    Allergies:   Chlorthalidone and Demerol [meperidine]    Social History:  The patient  reports that she has never smoked. She has never used smokeless tobacco. She reports that she does not drink alcohol and does not use drugs.   Family History:  The patient's family history includes Aortic stenosis in her brother; Breast cancer (age of onset: 43) in her sister; Cancer in her sister; Kidney failure in her brother; Pneumonia in her father; Stroke in her mother.    ROS:  Please see the history of present illness.   Otherwise, review of systems are positive for none.   All other systems are reviewed and negative.    PHYSICAL EXAM: VS:  BP 132/72 (BP Location: Left Arm, Patient Position:  Sitting, Cuff Size: Normal)   Ht 5\' 3"  (1.6 m)   Wt 113 lb 9.6 oz (51.5 kg)   BMI 20.12 kg/m  , BMI Body mass index is 20.12 kg/m. GENERAL:  Well appearing WF in NAD HEENT:  PERRL, EOMI, sclera are clear. Oropharynx is clear. NECK:  No jugular venous distention, carotid upstroke brisk and symmetric, no bruits, no thyromegaly or adenopathy LUNGS:  Clear to auscultation bilaterally CHEST:  Unremarkable HEART:  RRR,  PMI not displaced or sustained, grade soft 1-2/6 systolic murmur RUSB. S1 and S2 within normal limits, no S3, no S4: no clicks, no rubs ABD:  Soft, nontender. BS +, no masses or bruits. No hepatomegaly, no splenomegaly EXT:  2 + pulses throughout, no edema, no  cyanosis no clubbing SKIN:  Warm and dry.  No rashes NEURO:  Alert and oriented x 3. Cranial nerves II through XII intact. PSYCH:  Cognitively intact  EKG:  EKG is not ordered today.    Recent Labs: 06/12/2023: ALT 19; Magnesium 2.2; TSH 0.242 06/13/2023: BUN 19; Creatinine, Ser 1.12; Hemoglobin 11.2; Platelets 172; Potassium 4.1; Sodium 132    Lipid Panel No results found for: "CHOL", "TRIG", "HDL", "CHOLHDL", "VLDL", "LDLCALC", "LDLDIRECT"   Labs dated 06/03/17: cholesterol 232, triglycerides 158, HDL 47, LDL 153. A1c 6.2%. Potassium 5.5. Creatinine 0.74 Dated 05/20/18: cholesterol 218, triglycerides 121, HDL 54, LDL 140. CMET and TSH normal. Dated 12/26/22: A1c 6%. Cholesterol 221, triglycerides 124, HDL 55, LDL 144.   Wt Readings from Last 3 Encounters:  07/29/23 113 lb 9.6 oz (51.5 kg)  06/27/23 113 lb 12.8 oz (51.6 kg)  06/11/23 113 lb 15.7 oz (51.7 kg)      Other studies Reviewed: Additional studies/ records that were reviewed today include:  Echo 02/22/16: Study Conclusions   - Left ventricle: The cavity size was normal. Wall thickness was   normal. Systolic function was normal. The estimated ejection   fraction was in the range of 60% to 65%. Wall motion was normal;   there were no regional wall  motion abnormalities. Doppler   parameters are consistent with abnormal left ventricular   relaxation (grade 1 diastolic dysfunction). - Aortic valve: There was mild stenosis. There was mild   regurgitation. - Mitral valve: Calcified annulus. There was mild regurgitation  Echo 05/10/20: IMPRESSIONS     1. Left ventricular ejection fraction, by estimation, is 60 to 65%. The  left ventricle has normal function. The left ventricle has no regional  wall motion abnormalities. Left ventricular diastolic parameters are  consistent with Grade I diastolic  dysfunction (impaired relaxation).   2. Right ventricular systolic function is normal. The right ventricular  size is normal.   3. The mitral valve is grossly normal. Trivial mitral valve  regurgitation.   4. The aortic valve is tricuspid. There is moderate calcification of the  aortic valve. Aortic valve regurgitation is mild to moderate. Mild aortic  valve stenosis. Aortic valve area, by VTI measures 1.65 cm. Aortic valve  mean gradient measures 17.0  mmHg. Aortic valve Vmax measures 2.76 m/s.   Comparison(s): A prior study was performed on 02/22/2016. Similar aortic  gradients with slight increase in aortic valve calcification. Largest mean  gradient acquired 22 mm Hg.   Echo 08/29/20: IMPRESSIONS     1. Moderate aortic stenosis.   2. Left ventricular ejection fraction, by estimation, is 60 to 65%. The  left ventricle has normal function. The left ventricle has no regional  wall motion abnormalities. Left ventricular diastolic parameters are  consistent with Grade I diastolic  dysfunction (impaired relaxation).   3. Right ventricular systolic function is normal. The right ventricular  size is normal. There is normal pulmonary artery systolic pressure.   4. The mitral valve is normal in structure. Mild mitral valve  regurgitation. No evidence of mitral stenosis.   5. The aortic valve is calcified. There is moderate calcification of  the  aortic valve. There is moderate thickening of the aortic valve. Aortic  valve regurgitation is mild. Moderate aortic valve stenosis. Aortic valve  mean gradient measures 30.0 mmHg.  Aortic valve Vmax measures 3.58 m/s.   6. The inferior vena cava is normal in size with greater than 50%  respiratory variability, suggesting right atrial pressure of  3 mmHg.   Comparison(s): Prior images reviewed side by side. ECHO 01/22 aortic mean  17 and peak gradient 30.6 mmHg.   Echo 12/05/22: IMPRESSIONS     1. Mild intracavitary gradient. Peak velocity 0.96 m/s. Peak gradient 3.7  mmHg. Left ventricular ejection fraction, by estimation, is 60 to 65%.  Left ventricular ejection fraction by 3D volume is 64 %. The left  ventricle has normal function. The left  ventricle has no regional wall motion abnormalities. Left ventricular  diastolic parameters are consistent with Grade I diastolic dysfunction  (impaired relaxation). The average left ventricular global longitudinal  strain is -18.1 %. The global  longitudinal strain is normal.   2. Right ventricular systolic function is normal. The right ventricular  size is normal. There is normal pulmonary artery systolic pressure.   3. The mitral valve is normal in structure. Trivial mitral valve  regurgitation. No evidence of mitral stenosis.   4. TAVR mean gradient 15 mmHg, increased from 11 mmHg 10/2022. The aortic  valve has been repaired/replaced. Aortic valve regurgitation is not  visualized. No aortic stenosis is present. There is a 23 mm Edwards Sapien  prosthetic (TAVR) valve present in  the aortic position. Procedure Date: 10/23/2022. Echo findings are  consistent with normal structure and function of the aortic valve  prosthesis. Aortic valve area, by VTI measures 1.90 cm. Aortic valve mean  gradient measures 15.0 mmHg. Aortic valve Vmax  measures 2.54 m/s.   5. The inferior vena cava is normal in size with greater than 50%  respiratory  variability, suggesting right atrial pressure of 3 mmHg.   ASSESSMENT AND PLAN:  1.  Aortic stenosis now s/p TAVR. Excellent recovery. 2. Complete heart block now s/p PPM in Feb - followed by EP 3. HTN. Well controlled.  4. CAD nonobstructive on cardiac cath in June 5. Hypercholesterolemia.  LDL 144 in Sept. Started on Crestor 5 mg daily. Will update labs today 6. Pulmonary nodule.CT in Dec low risk  Follow up in 6 months.  Signed, Arlet Marter Swaziland, MD  07/29/2023 9:47 AM    [facility] [address] [place], [place], [address] Phone [phone], Fax [phone]

## 2023-07-27 ENCOUNTER — Encounter: Payer: Self-pay | Admitting: Internal Medicine

## 2023-07-27 LAB — CUP PACEART REMOTE DEVICE CHECK
Battery Remaining Longevity: 82 mo
Battery Remaining Percentage: 95.5 %
Battery Voltage: 3.01 V
Brady Statistic AP VP Percent: 32 %
Brady Statistic AP VS Percent: 1 %
Brady Statistic AS VP Percent: 66 %
Brady Statistic AS VS Percent: 1 %
Brady Statistic RA Percent Paced: 29 %
Brady Statistic RV Percent Paced: 98 %
Date Time Interrogation Session: 20250411020016
Implantable Lead Connection Status: 753985
Implantable Lead Connection Status: 753985
Implantable Lead Implant Date: 20250226
Implantable Lead Implant Date: 20250226
Implantable Lead Location: 753859
Implantable Lead Location: 753860
Implantable Lead Model: 3830
Implantable Pulse Generator Implant Date: 20250226
Lead Channel Impedance Value: 480 Ohm
Lead Channel Impedance Value: 580 Ohm
Lead Channel Pacing Threshold Amplitude: 0.5 V
Lead Channel Pacing Threshold Amplitude: 0.5 V
Lead Channel Pacing Threshold Pulse Width: 0.5 ms
Lead Channel Pacing Threshold Pulse Width: 0.5 ms
Lead Channel Sensing Intrinsic Amplitude: 12 mV
Lead Channel Sensing Intrinsic Amplitude: 2.9 mV
Lead Channel Setting Pacing Amplitude: 1.5 V
Lead Channel Setting Pacing Amplitude: 3.5 V
Lead Channel Setting Pacing Pulse Width: 0.5 ms
Lead Channel Setting Sensing Sensitivity: 4 mV
Pulse Gen Model: 2272
Pulse Gen Serial Number: 8241865

## 2023-07-29 ENCOUNTER — Encounter: Payer: Self-pay | Admitting: Cardiology

## 2023-07-29 ENCOUNTER — Ambulatory Visit: Payer: Medicare PPO | Attending: Cardiology | Admitting: Cardiology

## 2023-07-29 VITALS — BP 132/72 | Ht 63.0 in | Wt 113.6 lb

## 2023-07-29 DIAGNOSIS — I10 Essential (primary) hypertension: Secondary | ICD-10-CM | POA: Diagnosis not present

## 2023-07-29 DIAGNOSIS — Z952 Presence of prosthetic heart valve: Secondary | ICD-10-CM

## 2023-07-29 DIAGNOSIS — I442 Atrioventricular block, complete: Secondary | ICD-10-CM

## 2023-07-29 DIAGNOSIS — I350 Nonrheumatic aortic (valve) stenosis: Secondary | ICD-10-CM | POA: Diagnosis not present

## 2023-07-29 LAB — LIPID PANEL
Chol/HDL Ratio: 3.2 ratio (ref 0.0–4.4)
Cholesterol, Total: 154 mg/dL (ref 100–199)
HDL: 48 mg/dL (ref >39)
LDL Chol Calc (NIH): 80 mg/dL (ref 0–99)
Triglycerides: 147 mg/dL (ref 0–149)
VLDL Cholesterol Cal: 26 mg/dL (ref 5–40)

## 2023-07-29 LAB — BASIC METABOLIC PANEL WITH GFR
BUN/Creatinine Ratio: 14 (ref 12–28)
BUN: 13 mg/dL (ref 8–27)
CO2: 24 mmol/L (ref 20–29)
Calcium: 9.6 mg/dL (ref 8.7–10.3)
Chloride: 100 mmol/L (ref 96–106)
Creatinine, Ser: 0.9 mg/dL (ref 0.57–1.00)
Glucose: 115 mg/dL — ABNORMAL HIGH (ref 70–99)
Potassium: 5.2 mmol/L (ref 3.5–5.2)
Sodium: 136 mmol/L (ref 134–144)
eGFR: 62 mL/min/{1.73_m2} (ref >59)

## 2023-07-29 LAB — HEPATIC FUNCTION PANEL
ALT: 20 IU/L (ref 0–32)
AST: 25 IU/L (ref 0–40)
Albumin: 4.2 g/dL (ref 3.7–4.7)
Alkaline Phosphatase: 106 IU/L (ref 44–121)
Bilirubin Total: 0.2 mg/dL (ref 0.0–1.2)
Bilirubin, Direct: 0.12 mg/dL (ref 0.00–0.40)
Total Protein: 6.7 g/dL (ref 6.0–8.5)

## 2023-07-29 NOTE — Patient Instructions (Signed)
 Medication Instructions:  Continue same medications *If you need a refill on your cardiac medications before your next appointment, please call your pharmacy*  Lab Work: Bmet,lipid and hepatic panels today  Testing/Procedures: None ordered  Follow-Up: At [facility], you and your health needs are our priority.  As part of our continuing mission to provide you with exceptional heart care, our providers are all part of one team.  This team includes your primary Cardiologist (physician) and Advanced Practice Providers or APPs (Physician Assistants and Nurse Practitioners) who all work together to provide you with the care you need, when you need it.  Your next appointment:  6 months   Call in July to schedule Oct appointment     Provider:  Dr.Jordan   We recommend signing up for the patient portal called "MyChart".  Sign up information is provided on this After Visit Summary.  MyChart is used to connect with patients for Virtual Visits (Telemedicine).  Patients are able to view lab/test results, encounter notes, upcoming appointments, etc.  Non-urgent messages can be sent to your provider as well.   To learn more about what you can do with MyChart, go to [email or web address].         1st Floor: - Lobby - Registration  - Pharmacy  - Lab - Cafe  2nd Floor: - PV Lab - Diagnostic Testing (echo, CT, nuclear med)  3rd Floor: - Vacant  4th Floor: - TCTS (cardiothoracic surgery) - AFib Clinic - Structural Heart Clinic - Vascular Surgery  - Vascular Ultrasound  5th Floor: - HeartCare Cardiology (general and EP) - Clinical Pharmacy for coumadin, hypertension, lipid, weight-loss medications, and med management appointments    Valet parking services will be available as well.

## 2023-08-07 DIAGNOSIS — H903 Sensorineural hearing loss, bilateral: Secondary | ICD-10-CM | POA: Diagnosis not present

## 2023-08-09 DIAGNOSIS — H353231 Exudative age-related macular degeneration, bilateral, with active choroidal neovascularization: Secondary | ICD-10-CM | POA: Diagnosis not present

## 2023-08-16 ENCOUNTER — Telehealth: Payer: Self-pay

## 2023-08-16 NOTE — Telephone Encounter (Signed)
   Pre-operative Risk Assessment    Patient Name: Margaret Lucas  DOB: 1935/03/25 MRN: [id number]   Date of last office visit: 07/29/23 PETER Swaziland, MD Date of next office visit: 09/18/23 Pilar Bridge, PA   Request for Surgical Clearance    Procedure:   LEFT UPPER EYELID SCC RECONSTRUCTION  Date of Surgery:  Clearance 09/04/23                                Surgeon:  DR. TAL RUBINSTEIN Surgeon's Group or Practice Name:  [facility] Phone number:  [phone] Fax number:  [phone]   Type of Clearance Requested:   - Medical  - Pharmacy:  Hold Aspirin      Type of Anesthesia:  MAC   Additional requests/questions:    Signed, Collin Deal   08/16/2023, 5:52 PM

## 2023-08-19 ENCOUNTER — Other Ambulatory Visit: Payer: Self-pay | Admitting: Physician Assistant

## 2023-08-19 DIAGNOSIS — Z952 Presence of prosthetic heart valve: Secondary | ICD-10-CM

## 2023-08-19 NOTE — Telephone Encounter (Signed)
 Dr. Swaziland , patient's chart was reviewed for preoperative cardiac evaluation.  She was seen by you on 07/29/2023 and according to protocol, we request that you comment on cardiac risk for upcoming procedure along with agreement to hold aspirin .     Please route your response to p cv div preop.  Thank you, Gerldine Koch, NP-C 08/19/2023, 1:37 PM

## 2023-08-19 NOTE — Telephone Encounter (Signed)
   Primary Cardiologist: Peter Swaziland, MD  Chart reviewed as part of pre-operative protocol coverage. Given past medical history and time since last visit, based on ACC/AHA guidelines, Margaret Lucas would be at acceptable risk for the planned procedure without further cardiovascular testing.   Patient was advised that if she develops new symptoms prior to surgery to contact our office to arrange a follow-up appointment. She verbalized understanding.  Per office protocol, she may hold aspirin  for 5-7 days prior to procedure and should resume as soon as hemodynamically stable postoperatively.  I will route this recommendation to the requesting party via Epic fax function and remove from pre-op pool.  Please call with questions.  Gerldine Koch, NP-C 08/19/2023, 2:52 PM [address] [place], [place] [address] Office [phone] Fax [phone]

## 2023-08-29 NOTE — Progress Notes (Signed)
 Remote pacemaker transmission.

## 2023-08-29 NOTE — Addendum Note (Signed)
 Addended by: Lott Rouleau A on: 08/29/2023 11:12 AM   Modules accepted: Orders

## 2023-09-03 DIAGNOSIS — C441122 Basal cell carcinoma of skin of right lower eyelid, including canthus: Secondary | ICD-10-CM | POA: Diagnosis not present

## 2023-09-04 DIAGNOSIS — Z483 Aftercare following surgery for neoplasm: Secondary | ICD-10-CM | POA: Diagnosis not present

## 2023-09-04 DIAGNOSIS — S01111A Laceration without foreign body of right eyelid and periocular area, initial encounter: Secondary | ICD-10-CM | POA: Diagnosis not present

## 2023-09-04 DIAGNOSIS — C441122 Basal cell carcinoma of skin of right lower eyelid, including canthus: Secondary | ICD-10-CM | POA: Diagnosis not present

## 2023-09-04 DIAGNOSIS — Z481 Encounter for planned postprocedural wound closure: Secondary | ICD-10-CM | POA: Diagnosis not present

## 2023-09-04 DIAGNOSIS — Z85828 Personal history of other malignant neoplasm of skin: Secondary | ICD-10-CM | POA: Diagnosis not present

## 2023-09-16 ENCOUNTER — Encounter: Payer: Medicare PPO | Admitting: Internal Medicine

## 2023-09-17 NOTE — Progress Notes (Unsigned)
  Electrophysiology Office Note:   ID:  Jette, Lewan March 25, 1935, MRN [id number]  Primary Cardiologist: Peter Swaziland, MD Electrophysiologist: Richardo Chandler, MD  {Click to update primary MD,subspecialty MD or APP then REFRESH:1}    History of Present Illness:   JULIO STORR is a 89 y.o. female with h/o hypothyroid, HTN, HLD, DM2, TAVR, and CHB s/p PPM seen today for routine electrophysiology followup.   Since last being seen in our clinic the patient reports doing ***.  she denies chest pain, palpitations, dyspnea, PND, orthopnea, nausea, vomiting, dizziness, syncope, edema, weight gain, or early satiety.   Review of systems complete and found to be negative unless listed in HPI.   EP Information / Studies Reviewed:    EKG is not ordered today. EKG from 06/27/2023 reviewed which showed AS-VP at 63 bpm       PPM Interrogation-  reviewed in detail today,  See PACEART report.  Arrhythmia/Device History Abbott Dual Chamber PPM 06/12/2023 for CHB   Physical Exam:   VS:  There were no vitals taken for this visit.   Wt Readings from Last 3 Encounters:  07/29/23 113 lb 9.6 oz (51.5 kg)  06/27/23 113 lb 12.8 oz (51.6 kg)  06/11/23 113 lb 15.7 oz (51.7 kg)     GEN: No acute distress  NECK: No JVD; No carotid bruits CARDIAC: {EPRHYTHM:28826}, no murmurs, rubs, gallops RESPIRATORY:  Clear to auscultation without rales, wheezing or rhonchi  ABDOMEN: Soft, non-tender, non-distended EXTREMITIES:  {EDEMA LEVEL:28147::"No"} edema; No deformity   ASSESSMENT AND PLAN:    CHB s/p Abbott PPM  Normal PPM function See Pace Art report No changes today  H/o TAVR Stable on prior echo  HTN Stable on current regimen   DM2 Per PCP   {Click here to Review PMH, Prob List, Meds, Allergies, SHx, FHx  :1}   Disposition:   Follow up with {EPPROVIDERS:28135} {EPFOLLOW UP:28173}  Signed, Tylene Galla, PA-C

## 2023-09-18 ENCOUNTER — Ambulatory Visit: Attending: Student | Admitting: Student

## 2023-09-18 ENCOUNTER — Encounter: Payer: Self-pay | Admitting: Student

## 2023-09-18 VITALS — BP 124/54 | HR 77 | Ht 63.0 in | Wt 112.2 lb

## 2023-09-18 DIAGNOSIS — Z952 Presence of prosthetic heart valve: Secondary | ICD-10-CM | POA: Diagnosis not present

## 2023-09-18 DIAGNOSIS — I350 Nonrheumatic aortic (valve) stenosis: Secondary | ICD-10-CM

## 2023-09-18 DIAGNOSIS — I442 Atrioventricular block, complete: Secondary | ICD-10-CM | POA: Diagnosis not present

## 2023-09-18 DIAGNOSIS — I10 Essential (primary) hypertension: Secondary | ICD-10-CM | POA: Diagnosis not present

## 2023-09-18 DIAGNOSIS — Z950 Presence of cardiac pacemaker: Secondary | ICD-10-CM | POA: Diagnosis not present

## 2023-09-18 LAB — CUP PACEART INCLINIC DEVICE CHECK
Battery Remaining Longevity: 106 mo
Battery Voltage: 2.99 V
Brady Statistic RA Percent Paced: 34 %
Brady Statistic RV Percent Paced: 99.27 %
Date Time Interrogation Session: 20250604105147
Implantable Lead Connection Status: 753985
Implantable Lead Connection Status: 753985
Implantable Lead Implant Date: 20250226
Implantable Lead Implant Date: 20250226
Implantable Lead Location: 753859
Implantable Lead Location: 753860
Implantable Lead Model: 3830
Implantable Pulse Generator Implant Date: 20250226
Lead Channel Impedance Value: 537.5 Ohm
Lead Channel Impedance Value: 600 Ohm
Lead Channel Pacing Threshold Amplitude: 0.5 V
Lead Channel Pacing Threshold Amplitude: 0.5 V
Lead Channel Pacing Threshold Amplitude: 0.5 V
Lead Channel Pacing Threshold Pulse Width: 0.5 ms
Lead Channel Pacing Threshold Pulse Width: 0.5 ms
Lead Channel Pacing Threshold Pulse Width: 0.5 ms
Lead Channel Sensing Intrinsic Amplitude: 2.4 mV
Lead Channel Setting Pacing Amplitude: 1.5 V
Lead Channel Setting Pacing Amplitude: 2.5 V
Lead Channel Setting Pacing Pulse Width: 0.5 ms
Lead Channel Setting Sensing Sensitivity: 4 mV
Pulse Gen Model: 2272
Pulse Gen Serial Number: 8241865

## 2023-09-18 NOTE — Patient Instructions (Signed)
 Medication Instructions:  No medication changes today. *If you need a refill on your cardiac medications before your next appointment, please call your pharmacy*  Lab Work: No labwork ordered today. If you have labs (blood work) drawn today and your tests are completely normal, you will receive your results only by: MyChart Message (if you have MyChart) OR A paper copy in the mail If you have any lab test that is abnormal or we need to change your treatment, we will call you to review the results.  Testing/Procedures: No testing ordered today  Follow-Up: At [facility], you and your health needs are our priority.  As part of our continuing mission to provide you with exceptional heart care, our providers are all part of one team.  This team includes your primary Cardiologist (physician) and Advanced Practice Providers or APPs (Physician Assistants and Nurse Practitioners) who all work together to provide you with the care you need, when you need it.  Your next appointment:   12 month(s)  Provider:   You may see Richardo Chandler, MD or one of the following Advanced Practice Providers on your designated Care Team:   Mertha Abrahams, Kennard Pea "Jonelle Neri" [place], PA-C Suzann Riddle, NP Creighton Doffing, NP    We recommend signing up for the patient portal called "MyChart".  Sign up information is provided on this After Visit Summary.  MyChart is used to connect with patients for Virtual Visits (Telemedicine).  Patients are able to view lab/test results, encounter notes, upcoming appointments, etc.  Non-urgent messages can be sent to your provider as well.   To learn more about what you can do with MyChart, go to [email or web address].

## 2023-09-27 DIAGNOSIS — H353231 Exudative age-related macular degeneration, bilateral, with active choroidal neovascularization: Secondary | ICD-10-CM | POA: Diagnosis not present

## 2023-10-03 ENCOUNTER — Other Ambulatory Visit: Payer: Self-pay | Admitting: *Deleted

## 2023-10-03 MED ORDER — LISINOPRIL 40 MG PO TABS: 40.0000 mg | ORAL_TABLET | Freq: Every day | ORAL | 3 refills | Status: AC

## 2023-10-25 ENCOUNTER — Ambulatory Visit: Payer: Medicare PPO

## 2023-10-25 DIAGNOSIS — I442 Atrioventricular block, complete: Secondary | ICD-10-CM

## 2023-10-25 LAB — CUP PACEART REMOTE DEVICE CHECK
Battery Remaining Longevity: 105 mo
Battery Remaining Percentage: 95.5 %
Battery Voltage: 2.99 V
Brady Statistic AP VP Percent: 38 %
Brady Statistic AP VS Percent: 1 %
Brady Statistic AS VP Percent: 62 %
Brady Statistic AS VS Percent: 1 %
Brady Statistic RA Percent Paced: 37 %
Brady Statistic RV Percent Paced: 99 %
Date Time Interrogation Session: 20250711034419
Implantable Lead Connection Status: 753985
Implantable Lead Connection Status: 753985
Implantable Lead Implant Date: 20250226
Implantable Lead Implant Date: 20250226
Implantable Lead Location: 753859
Implantable Lead Location: 753860
Implantable Lead Model: 3830
Implantable Pulse Generator Implant Date: 20250226
Lead Channel Impedance Value: 480 Ohm
Lead Channel Impedance Value: 550 Ohm
Lead Channel Pacing Threshold Amplitude: 0.5 V
Lead Channel Pacing Threshold Amplitude: 0.5 V
Lead Channel Pacing Threshold Pulse Width: 0.5 ms
Lead Channel Pacing Threshold Pulse Width: 0.5 ms
Lead Channel Sensing Intrinsic Amplitude: 12 mV
Lead Channel Sensing Intrinsic Amplitude: 2.3 mV
Lead Channel Setting Pacing Amplitude: 1.5 V
Lead Channel Setting Pacing Amplitude: 2.5 V
Lead Channel Setting Pacing Pulse Width: 0.5 ms
Lead Channel Setting Sensing Sensitivity: 4 mV
Pulse Gen Model: 2272
Pulse Gen Serial Number: 8241865

## 2023-10-29 ENCOUNTER — Ambulatory Visit: Payer: Self-pay | Admitting: Cardiology

## 2023-10-31 ENCOUNTER — Ambulatory Visit: Payer: Self-pay | Admitting: Physician Assistant

## 2023-10-31 ENCOUNTER — Ambulatory Visit: Attending: Physician Assistant | Admitting: Physician Assistant

## 2023-10-31 ENCOUNTER — Ambulatory Visit (HOSPITAL_COMMUNITY)
Admission: RE | Admit: 2023-10-31 | Discharge: 2023-10-31 | Disposition: A | Discharge status: Home / Self Care | Source: Ambulatory Visit | Attending: Cardiology | Admitting: Cardiology

## 2023-10-31 ENCOUNTER — Other Ambulatory Visit (HOSPITAL_COMMUNITY): Payer: Self-pay

## 2023-10-31 VITALS — BP 146/38 | Ht 62.0 in | Wt 114.0 lb

## 2023-10-31 DIAGNOSIS — Z952 Presence of prosthetic heart valve: Secondary | ICD-10-CM | POA: Diagnosis not present

## 2023-10-31 DIAGNOSIS — Z950 Presence of cardiac pacemaker: Secondary | ICD-10-CM

## 2023-10-31 DIAGNOSIS — I10 Essential (primary) hypertension: Secondary | ICD-10-CM

## 2023-10-31 LAB — ECHOCARDIOGRAM COMPLETE
AR max vel: 1.78 cm2
AV Area VTI: 1.93 cm2
AV Area mean vel: 1.95 cm2
AV Mean grad: 13 mmHg
AV Peak grad: 24 mmHg
Ao pk vel: 2.45 m/s
Area-P 1/2: 2.79 cm2
MV M vel: 5.99 m/s
MV Peak grad: 143.5 mmHg
MV VTI: 1.91 cm2
Radius: 0.5 cm
S' Lateral: 1.9 cm

## 2023-10-31 MED ORDER — AMOXICILLIN 500 MG PO TABS: 2000.0000 mg | ORAL_TABLET | ORAL | 12 refills | Status: AC

## 2023-10-31 MED ORDER — AMOXICILLIN 500 MG PO TABS
2000.0000 mg | ORAL_TABLET | ORAL | 12 refills | Status: DC
  Filled 2023-10-31: qty 12, fill #0

## 2023-10-31 NOTE — Patient Instructions (Signed)
 Medication Instructions:  Your physician recommends that you continue on your current medications as directed. Please refer to the Current Medication list given to you today.  Refills of Amoxicillin  sent to pharmacy. *If you need a refill on your cardiac medications before your next appointment, please call your pharmacy*  Lab Work: None needed If you have labs (blood work) drawn today and your tests are completely normal, you will receive your results only by: MyChart Message (if you have MyChart) OR A paper copy in the mail If you have any lab test that is abnormal or we need to change your treatment, we will call you to review the results.  Testing/Procedures: None needed  Follow-Up: At [facility], you and your health needs are our priority.  As part of our continuing mission to provide you with exceptional heart care, our providers are all part of one team.  This team includes your primary Cardiologist (physician) and Advanced Practice Providers or APPs (Physician Assistants and Nurse Practitioners) who all work together to provide you with the care you need, when you need it.  Your next appointment:   As scheduled  Provider:   Peter Swaziland, MD    We recommend signing up for the patient portal called MyChart.  Sign up information is provided on this After Visit Summary.  MyChart is used to connect with patients for Virtual Visits (Telemedicine).  Patients are able to view lab/test results, encounter notes, upcoming appointments, etc.  Non-urgent messages can be sent to your provider as well.   To learn more about what you can do with MyChart, go to [email or web address].

## 2023-10-31 NOTE — Progress Notes (Unsigned)
 HEART AND VASCULAR CENTER   MULTIDISCIPLINARY HEART VALVE CLINIC                                     Cardiology Office Note:    Date:  11/01/2023   ID:  Asberry GORMAN Daring, DOB 1935/02/15, MRN [id number]  PCP:  Gerome Brunet, DO  [facility] Cardiologist:  Peter Swaziland, MD  [facility] Structural heart: Lurena MARLA Red, MD [facility] Electrophysiologist:  Elspeth Sage, MD   Referring MD: Gerome Brunet, DO   1 year s/p TAVR  History of Present Illness:    GOLDIA LIGMAN is a 89 y.o. 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    Echo 08/30/2022 showed an EF 60% and moderate AS with a mean grad 30 mmHg, peak grad 51.3 mmHg, DVI 0.98, SVI 61, as well a mild MR & AI. Cardiac catheterization showed nonobstructive coronary disease with a mean gradient across aortic valve of 32.5 mmHg with a valve area of 0.84 cm.  S/p TAVR with a 23 mm Edwards Sapien 3 Ultra Resilia 23 THV via the TF approach on 10/23/22. Post operative echo showed EF 65%, normally functioning TAVR with a mean gradient of 11 mmHg and no PVL. She was continued on home aspirin . 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  She was noted to have a 7 mm right lung nodule and follow up CT showed resolution but other left lobe nodules that were low risk. PYP scan for amyloid screening was low risk.    Today the patient presents to clinic for follow up. No CP or SOB. No LE edema, orthopnea or PND. No dizziness or syncope. No blood in stool or urine. No palpitations.    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 Red & Bartle   Severe aortic stenosis    Upper GI bleed 08/07/2016   Vertigo      Current Medications: Current Meds  Medication Sig   pantoprazole  (PROTONIX ) 40 MG tablet Take 1 tablet (40 mg total) by mouth 2 (two) times daily. (Patient taking differently: Take 40 mg by mouth daily.)      ROS:   Please see the history of present illness.    All other systems reviewed and are negative.  EKGs       Risk Assessment/Calculations:            Physical Exam:    VS:  BP (!) 146/38   Ht 5' 2 (1.575 m)   Wt 114 lb (51.7 kg)   SpO2 99%   BMI 20.85 kg/m     Wt Readings from Last 3 Encounters:  10/31/23 114 lb (51.7 kg)  09/18/23 112 lb 3.2 oz (50.9 kg)  07/29/23 113 lb 9.6 oz (51.5 kg)     GEN: Well nourished, well developed in no acute distress NECK: No JVD CARDIAC: RRR,  soft flow murmur at RUSB,No rubs, gallops RESPIRATORY:  Clear to auscultation without rales, wheezing or rhonchi  ABDOMEN: Soft, non-tender, non-distended EXTREMITIES:  No edema; No deformity.    ASSESSMENT:    1. S/P TAVR (transcatheter aortic valve replacement)   2. Essential hypertension   3. Pacemaker     PLAN:    In order of problems listed above:  Severe AS s/p TAVR:  -- Echo today shows EF 60% mild MR, normally functioning TAVR with a mean gradient of 13 mm hg and no PVL.  -- NYHA class I symptoms.  -- Continue Aspirin  81mg  daily.  -- SBE prophylaxis discussed; I have RX'd amoxicillin .  -- Continue regular follow up with Dr. Swaziland.  HTN:  -- BP initially mildly elevated but 132/52 on personal recheck.  -- Continue Noravasc 10mg  daily, Lasix  20 3xw, lisinopril  40mg  daily  S/p PPM: -- Followed by Dr. Fernande.   Medication Adjustments/Labs and Tests Ordered: Current medicines are reviewed at length with the patient today.  Concerns regarding medicines are outlined above.  No  orders of the defined types were placed in this encounter.  Meds ordered this encounter  Medications   DISCONTD: amoxicillin  (AMOXIL ) 500 MG tablet    Sig: Take 4 tablets (2,000 mg total) by mouth as directed. 1 hour prior to dental work including cleanings    Dispense:  12 tablet    Refill:  12   amoxicillin  (AMOXIL ) 500 MG tablet    Sig: Take 4 tablets (2,000 mg total) by mouth as directed. 1 hour prior to dental work including cleanings    Dispense:  12 tablet    Refill:  12    Patient Instructions  Medication Instructions:  Your physician recommends that you continue on your current medications as directed. Please refer to the Current Medication list given to you today.  Refills of Amoxicillin  sent to pharmacy. *If you need a refill on your cardiac medications before your next appointment, please call your pharmacy*  Lab Work: None needed If you have labs (blood work) drawn today and your tests are completely normal, you will receive your results only by: MyChart Message (if you have MyChart) OR A paper copy in the mail If you have any lab test that is abnormal or we need to change your treatment, we will call you to review the results.  Testing/Procedures: None needed  Follow-Up: At [facility], you and your health needs are our priority.  As part of our continuing mission to provide you with exceptional heart care, our providers are all part of one team.  This team includes your primary Cardiologist (physician) and Advanced Practice Providers or APPs (Physician Assistants and Nurse Practitioners) who all work together to provide you with the care you need, when you need it.  Your next appointment:   As scheduled  Provider:   Peter Swaziland, MD    We recommend signing up for the patient portal called MyChart.  Sign up information is provided on this After Visit Summary.  MyChart is used to connect with patients for Virtual Visits (Telemedicine).  Patients are  able to view lab/test results, encounter notes, upcoming appointments, etc.  Non-urgent messages can be sent to your provider as well.   To learn more about what you can do with MyChart, go to [email or web address].        Signed, Lamarr Hummer, PA-C  11/01/2023 9:58 AM    [facility]

## 2023-11-08 DIAGNOSIS — H353231 Exudative age-related macular degeneration, bilateral, with active choroidal neovascularization: Secondary | ICD-10-CM | POA: Diagnosis not present

## 2023-11-12 ENCOUNTER — Ambulatory Visit: Payer: Self-pay | Admitting: Cardiovascular Disease

## 2023-11-27 ENCOUNTER — Other Ambulatory Visit: Payer: Self-pay | Admitting: Family Medicine

## 2023-11-27 DIAGNOSIS — Z1231 Encounter for screening mammogram for malignant neoplasm of breast: Secondary | ICD-10-CM

## 2023-12-17 ENCOUNTER — Ambulatory Visit
Admission: RE | Admit: 2023-12-17 | Discharge: 2023-12-17 | Disposition: A | Discharge status: Home / Self Care | Source: Ambulatory Visit | Attending: Family Medicine | Admitting: Family Medicine

## 2023-12-17 DIAGNOSIS — Z1231 Encounter for screening mammogram for malignant neoplasm of breast: Secondary | ICD-10-CM

## 2023-12-20 DIAGNOSIS — H353231 Exudative age-related macular degeneration, bilateral, with active choroidal neovascularization: Secondary | ICD-10-CM | POA: Diagnosis not present

## 2023-12-26 DIAGNOSIS — Z952 Presence of prosthetic heart valve: Secondary | ICD-10-CM | POA: Diagnosis not present

## 2023-12-26 DIAGNOSIS — Z79899 Other long term (current) drug therapy: Secondary | ICD-10-CM | POA: Diagnosis not present

## 2023-12-26 DIAGNOSIS — E871 Hypo-osmolality and hyponatremia: Secondary | ICD-10-CM | POA: Diagnosis not present

## 2023-12-26 DIAGNOSIS — E1121 Type 2 diabetes mellitus with diabetic nephropathy: Secondary | ICD-10-CM | POA: Diagnosis not present

## 2023-12-26 DIAGNOSIS — I10 Essential (primary) hypertension: Secondary | ICD-10-CM | POA: Diagnosis not present

## 2023-12-26 DIAGNOSIS — E039 Hypothyroidism, unspecified: Secondary | ICD-10-CM | POA: Diagnosis not present

## 2024-01-02 DIAGNOSIS — E7800 Pure hypercholesterolemia, unspecified: Secondary | ICD-10-CM | POA: Diagnosis not present

## 2024-01-02 DIAGNOSIS — E1122 Type 2 diabetes mellitus with diabetic chronic kidney disease: Secondary | ICD-10-CM | POA: Diagnosis not present

## 2024-01-02 DIAGNOSIS — Z0000 Encounter for general adult medical examination without abnormal findings: Secondary | ICD-10-CM | POA: Diagnosis not present

## 2024-01-02 DIAGNOSIS — I129 Hypertensive chronic kidney disease with stage 1 through stage 4 chronic kidney disease, or unspecified chronic kidney disease: Secondary | ICD-10-CM | POA: Diagnosis not present

## 2024-01-02 DIAGNOSIS — M810 Age-related osteoporosis without current pathological fracture: Secondary | ICD-10-CM | POA: Diagnosis not present

## 2024-01-02 DIAGNOSIS — Z952 Presence of prosthetic heart valve: Secondary | ICD-10-CM | POA: Diagnosis not present

## 2024-01-02 DIAGNOSIS — K219 Gastro-esophageal reflux disease without esophagitis: Secondary | ICD-10-CM | POA: Diagnosis not present

## 2024-01-02 DIAGNOSIS — E039 Hypothyroidism, unspecified: Secondary | ICD-10-CM | POA: Diagnosis not present

## 2024-01-02 DIAGNOSIS — N1831 Chronic kidney disease, stage 3a: Secondary | ICD-10-CM | POA: Diagnosis not present

## 2024-01-23 NOTE — Progress Notes (Signed)
 Remote PPM Transmission

## 2024-01-24 ENCOUNTER — Ambulatory Visit: Payer: Medicare PPO

## 2024-01-24 DIAGNOSIS — I442 Atrioventricular block, complete: Secondary | ICD-10-CM | POA: Diagnosis not present

## 2024-01-27 LAB — CUP PACEART REMOTE DEVICE CHECK
Battery Remaining Longevity: 104 mo
Battery Remaining Percentage: 94 %
Battery Voltage: 2.99 V
Brady Statistic AP VP Percent: 43 %
Brady Statistic AP VS Percent: 1 %
Brady Statistic AS VP Percent: 57 %
Brady Statistic AS VS Percent: 1 %
Brady Statistic RA Percent Paced: 42 %
Brady Statistic RV Percent Paced: 99 %
Date Time Interrogation Session: 20251010020012
Implantable Lead Connection Status: 753985
Implantable Lead Connection Status: 753985
Implantable Lead Implant Date: 20250226
Implantable Lead Implant Date: 20250226
Implantable Lead Location: 753859
Implantable Lead Location: 753860
Implantable Lead Model: 3830
Implantable Pulse Generator Implant Date: 20250226
Lead Channel Impedance Value: 460 Ohm
Lead Channel Impedance Value: 590 Ohm
Lead Channel Pacing Threshold Amplitude: 0.5 V
Lead Channel Pacing Threshold Amplitude: 0.5 V
Lead Channel Pacing Threshold Pulse Width: 0.5 ms
Lead Channel Pacing Threshold Pulse Width: 0.5 ms
Lead Channel Sensing Intrinsic Amplitude: 12 mV
Lead Channel Sensing Intrinsic Amplitude: 2.1 mV
Lead Channel Setting Pacing Amplitude: 1.5 V
Lead Channel Setting Pacing Amplitude: 2.5 V
Lead Channel Setting Pacing Pulse Width: 0.5 ms
Lead Channel Setting Sensing Sensitivity: 4 mV
Pulse Gen Model: 2272
Pulse Gen Serial Number: 8241865

## 2024-01-28 NOTE — Progress Notes (Signed)
 Remote PPM Transmission

## 2024-02-05 ENCOUNTER — Ambulatory Visit: Payer: Self-pay | Admitting: Cardiology

## 2024-02-07 DIAGNOSIS — H353231 Exudative age-related macular degeneration, bilateral, with active choroidal neovascularization: Secondary | ICD-10-CM | POA: Diagnosis not present

## 2024-04-01 NOTE — Progress Notes (Unsigned)
 Cardiology Office Note   Date:  04/02/2024   ID:  Margaret Lucas, DOB 01/04/1935, MRN [id number]  PCP:  Gerome Brunet, DO  Cardiologist:   Batul Diego, MD   No chief complaint on file.     History of Present Illness: Margaret Lucas is a 89 y.o. female who is seen for follow up of aortic stenosis.  She has a history of HTN and DM- diet controlled. She mentions she has a murmur dating back several  years. She had an Echo in 2014 showing some AV sclerosis. Repeat in 2017 showed mild AS but valve velocity was really unchanged. Echo in Jan 2022 showed mild AS without change.   She was admitted in November 2021 with N/V, dehydration, UTI and hyponatremia. HCT held. Follow up sodium level improved.    In May she  developed exertional fatigue, shortness of breath, LE edema as well as chest tightness and dizziness. Repeat echocardiogram on 08/30/2022 showed an EF 60% and moderate AS with a mean grad 30 mmHg, peak grad 51.3 mmHg, DVI 0.98, SVI 61, as well a mild MR & AI. Cardiac catheterization showed nonobstructive coronary disease with a mean gradient across aortic valve of 32.5 mmHg with a valve area of 0.84 cm.    The patient was evaluated by the multidisciplinary valve team and underwent successful TAVR with a 23 mm Edwards Sapien 3 Ultra Resilia 23 THV via the TF approach on 10/23/22. Post operative echo showed EF 65%, normally functioning TAVR with a mean gradient of 11 mmHg and no PVL. She was continued on home aspirin . Post procedure she developed some edema that improved with lasix . She was noted to have a 7 mm right lung nodule and repeat CT recommended. She did have a PET scan for amyloid screening and this was low risk.   Admitted 06/11/2023 with new CHB, and underwent PPM implantation.   On follow up today she is doing very well.  No dyspnea, chest pain or palpitations. No edema. BP is well controlled. Feels very well.    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 Wendel & Bartle   Severe aortic stenosis    Upper GI bleed 08/07/2016   Vertigo     Past Surgical History:  Procedure Laterality Date   CHOLECYSTECTOMY     COLONOSCOPY  2010 last   ESOPHAGOGASTRODUODENOSCOPY N/A 08/08/2016   Procedure: ESOPHAGOGASTRODUODENOSCOPY (EGD);  Surgeon: Lupita FORBES Commander, MD;  Location: [facility] ENDOSCOPY;  Service: Endoscopy;  Laterality: N/A;   INTRAOPERATIVE TRANSTHORACIC ECHOCARDIOGRAM N/A 10/23/2022   Procedure: INTRAOPERATIVE TRANSTHORACIC ECHOCARDIOGRAM;  Surgeon: Wendel Lurena POUR, MD;  Location: [facility] INVASIVE CV LAB;  Service: Open Heart Surgery;  Laterality: N/A;   PACEMAKER IMPLANT N/A 06/12/2023   Procedure: PACEMAKER IMPLANT;  Surgeon: Fernande Elspeth BROCKS, MD;  Location: [facility] INVASIVE CV LAB;  Service: Cardiovascular;  Laterality: N/A;   RIGHT/LEFT HEART CATH AND CORONARY ANGIOGRAPHY N/A 09/21/2022   Procedure: RIGHT/LEFT HEART CATH AND CORONARY ANGIOGRAPHY;  Surgeon: Sherron Mummert M, MD;  Location: [facility] INVASIVE CV LAB;  Service: Cardiovascular;  Laterality: N/A;   SKIN CANCER EXCISION  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    TRANSCATHETER AORTIC VALVE REPLACEMENT, TRANSFEMORAL N/A 10/23/2022   Procedure: Transcatheter Aortic Valve Replacement, Transfemoral;  Surgeon: Thukkani, Arun K, MD;  Location: [facility] INVASIVE CV LAB;  Service: Open Heart Surgery;  Laterality: N/A;   TUBAL LIGATION       Current Outpatient Medications  Medication Sig Dispense Refill   amLODipine  (NORVASC ) 10 MG tablet Take 10 mg by mouth daily.     aspirin  EC 81 MG tablet Take 1 tablet (81 mg total) by mouth daily. Swallow whole.      Cholecalciferol (VITAMIN D) 125 MCG (5000 UT) CAPS Take 5,000 Units by mouth at bedtime.     furosemide  (LASIX ) 20 MG tablet Take 1 tablet (20 mg total) by mouth 3 (three) times a week.     levothyroxine  (SYNTHROID ) 88 MCG tablet Take 1 tablet (88 mcg total) by mouth daily before breakfast. 30 tablet 3   lisinopril  (ZESTRIL ) 40 MG tablet Take 1 tablet (40 mg total) by mouth daily. 90 tablet 3   Multiple Vitamins-Minerals (PRESERVISION AREDS 2 PO) Take 1 tablet by mouth in the morning and at bedtime.     pantoprazole  (PROTONIX ) 40 MG tablet Take 1 tablet (40 mg total) by mouth 2 (two) times daily. (Patient taking differently: Take 40 mg by mouth daily.) 60 tablet 0   rosuvastatin  (CRESTOR ) 5 MG tablet Take 5 mg by mouth at bedtime.     amoxicillin  (AMOXIL ) 500 MG tablet Take 4 tablets (2,000 mg total) by mouth as directed. 1 hour prior to dental work including cleanings (Patient not taking: Reported on 04/02/2024) 12 tablet 12   fluticasone (FLONASE) 50 MCG/ACT nasal spray Place 2 sprays into both nostrils daily as needed for allergies. (Patient not taking: Reported on 04/02/2024)     No current facility-administered medications for this visit.    Allergies:   Chlorthalidone  and Demerol [meperidine]    Social History:  The patient  reports that she has never smoked. She has never used smokeless tobacco. She reports that she does not drink alcohol and does not use drugs.   Family History:  The patient's family history includes Aortic stenosis in her brother; Breast cancer in her paternal aunt and paternal aunt; Breast cancer (age of onset: 54) in her sister; Cancer in her sister; Kidney failure in her brother; Pneumonia in her father; Stroke in her mother.    ROS:  Please see the history of present illness.   Otherwise, review of systems are positive for none.   All other systems are reviewed and negative.    PHYSICAL EXAM: VS:  BP (!) 142/52 (BP Location: Left Arm, Patient Position: Sitting,  Cuff Size: Normal)   Pulse 65   Ht 5' 3 (1.6 m)   Wt 110 lb 8 oz (50.1 kg)   SpO2 96%   BMI 19.57 kg/m  , BMI Body mass index is 19.57 kg/m. GENERAL:  Well appearing WF in NAD HEENT:  PERRL, EOMI, sclera are clear. Oropharynx is clear. NECK:  No jugular venous distention, carotid upstroke brisk and symmetric, no bruits, no thyromegaly or adenopathy LUNGS:  Clear to auscultation bilaterally CHEST:  Unremarkable HEART:  RRR,  PMI not displaced or sustained, grade soft 1-2/6 systolic murmur RUSB. S1 and S2 within normal limits, no S3, no S4: no clicks, no rubs ABD:  Soft, nontender. BS +, no masses or bruits. No hepatomegaly, no splenomegaly EXT:  2 + pulses throughout, no edema,  no cyanosis no clubbing SKIN:  Warm and dry.  No rashes NEURO:  Alert and oriented x 3. Cranial nerves II through XII intact. PSYCH:  Cognitively intact  EKG Interpretation Date/Time:  Thursday April 02 2024 10:04:54 EST Ventricular Rate:  65 PR Interval:  216 QRS Duration:  164 QT Interval:  446 QTC Calculation: 463 R Axis:   -74  Text Interpretation: Atrial-sensed ventricular-paced rhythm with prolonged AV conduction When compared with ECG of 27-Jun-2023 09:03,  No significant change was found  Confirmed by Porscha Axley [phone]) on 04/02/2024 10:15:40 AM    Recent Labs: 06/12/2023: Magnesium  2.2; TSH 0.242 06/13/2023: Hemoglobin 11.2; Platelets 172 07/29/2023: ALT 20; BUN 13; Creatinine, Ser 0.90; Potassium 5.2; Sodium 136    Lipid Panel    Component Value Date/Time   CHOL 154 07/29/2023 0957   TRIG 147 07/29/2023 0957   HDL 48 07/29/2023 0957   CHOLHDL 3.2 07/29/2023 0957   LDLCALC 80 07/29/2023 0957     Labs dated 06/03/17: cholesterol 232, triglycerides 158, HDL 47, LDL 153. A1c 6.2%. Potassium 5.5. Creatinine 0.74 Dated 05/20/18: cholesterol 218, triglycerides 121, HDL 54, LDL 140. CMET and TSH normal. Dated 12/26/22: A1c 6%. Cholesterol 221, triglycerides 124, HDL 55, LDL 144.   Wt  Readings from Last 3 Encounters:  04/02/24 110 lb 8 oz (50.1 kg)  10/31/23 114 lb (51.7 kg)  09/18/23 112 lb 3.2 oz (50.9 kg)      Other studies Reviewed: Additional studies/ records that were reviewed today include:  Echo 02/22/16: Study Conclusions   - Left ventricle: The cavity size was normal. Wall thickness was   normal. Systolic function was normal. The estimated ejection   fraction was in the range of 60% to 65%. Wall motion was normal;   there were no regional wall motion abnormalities. Doppler   parameters are consistent with abnormal left ventricular   relaxation (grade 1 diastolic dysfunction). - Aortic valve: There was mild stenosis. There was mild   regurgitation. - Mitral valve: Calcified annulus. There was mild regurgitation  Echo 05/10/20: IMPRESSIONS     1. Left ventricular ejection fraction, by estimation, is 60 to 65%. The  left ventricle has normal function. The left ventricle has no regional  wall motion abnormalities. Left ventricular diastolic parameters are  consistent with Grade I diastolic  dysfunction (impaired relaxation).   2. Right ventricular systolic function is normal. The right ventricular  size is normal.   3. The mitral valve is grossly normal. Trivial mitral valve  regurgitation.   4. The aortic valve is tricuspid. There is moderate calcification of the  aortic valve. Aortic valve regurgitation is mild to moderate. Mild aortic  valve stenosis. Aortic valve area, by VTI measures 1.65 cm. Aortic valve  mean gradient measures 17.0  mmHg. Aortic valve Vmax measures 2.76 m/s.   Comparison(s): A prior study was performed on 02/22/2016. Similar aortic  gradients with slight increase in aortic valve calcification. Largest mean  gradient acquired 22 mm Hg.   Echo 08/29/20: IMPRESSIONS     1. Moderate aortic stenosis.   2. Left ventricular ejection fraction, by estimation, is 60 to 65%. The  left ventricle has normal function. The left ventricle  has no regional  wall motion abnormalities. Left ventricular diastolic parameters are  consistent with Grade I diastolic  dysfunction (impaired relaxation).   3. Right ventricular systolic function is normal. The right ventricular  size is normal. There is normal pulmonary artery systolic pressure.   4. The mitral valve  is normal in structure. Mild mitral valve  regurgitation. No evidence of mitral stenosis.   5. The aortic valve is calcified. There is moderate calcification of the  aortic valve. There is moderate thickening of the aortic valve. Aortic  valve regurgitation is mild. Moderate aortic valve stenosis. Aortic valve  mean gradient measures 30.0 mmHg.  Aortic valve Vmax measures 3.58 m/s.   6. The inferior vena cava is normal in size with greater than 50%  respiratory variability, suggesting right atrial pressure of 3 mmHg.   Comparison(s): Prior images reviewed side by side. ECHO 01/22 aortic mean  17 and peak gradient 30.6 mmHg.   Echo 12/05/22: IMPRESSIONS     1. Mild intracavitary gradient. Peak velocity 0.96 m/s. Peak gradient 3.7  mmHg. Left ventricular ejection fraction, by estimation, is 60 to 65%.  Left ventricular ejection fraction by 3D volume is 64 %. The left  ventricle has normal function. The left  ventricle has no regional wall motion abnormalities. Left ventricular  diastolic parameters are consistent with Grade I diastolic dysfunction  (impaired relaxation). The average left ventricular global longitudinal  strain is -18.1 %. The global  longitudinal strain is normal.   2. Right ventricular systolic function is normal. The right ventricular  size is normal. There is normal pulmonary artery systolic pressure.   3. The mitral valve is normal in structure. Trivial mitral valve  regurgitation. No evidence of mitral stenosis.   4. TAVR mean gradient 15 mmHg, increased from 11 mmHg 10/2022. The aortic  valve has been repaired/replaced. Aortic valve regurgitation  is not  visualized. No aortic stenosis is present. There is a 23 mm Edwards Sapien  prosthetic (TAVR) valve present in  the aortic position. Procedure Date: 10/23/2022. Echo findings are  consistent with normal structure and function of the aortic valve  prosthesis. Aortic valve area, by VTI measures 1.90 cm. Aortic valve mean  gradient measures 15.0 mmHg. Aortic valve Vmax  measures 2.54 m/s.   5. The inferior vena cava is normal in size with greater than 50%  respiratory variability, suggesting right atrial pressure of 3 mmHg.   Echo 10/31/23: IMPRESSIONS     1. Left ventricular ejection fraction, by estimation, is 60 to 65%. Left  ventricular ejection fraction by 3D volume is 68 %. The left ventricle has  normal function. The left ventricle has no regional wall motion  abnormalities. Left ventricular diastolic   function could not be evaluated.   2. Right ventricular systolic function is normal. The right ventricular  size is normal. There is normal pulmonary artery systolic pressure. The  estimated right ventricular systolic pressure is 34.8 mmHg.   3. Left atrial size was mildly dilated.   4. The mitral valve is normal in structure. Mild mitral valve  regurgitation. No evidence of mitral stenosis.   5. The aortic valve has been repaired/replaced. Aortic valve  regurgitation is not visualized. No aortic stenosis is present. There is a  23 mm Sapien prosthetic (TAVR) valve present in the aortic position.  Procedure Date: 10/23/22. Aortic valve mean  gradient measures 13.0 mmHg. Aortic valve Vmax measures 2.45 m/s.   6. The inferior vena cava is normal in size with greater than 50%  respiratory variability, suggesting right atrial pressure of 3 mmHg.    ASSESSMENT AND PLAN:  1.  Aortic stenosis now s/p TAVR. Excellent recovery. Echo in July with normal LV function and normal valve function 2. Complete heart block now s/p PPM in Feb - followed by  EP 3. HTN. Well controlled.  4.  CAD nonobstructive on cardiac cath in June 5. Hypercholesterolemia.  LDL 80 in Sept. On Crestor  5 mg daily.  6. Pulmonary nodule.CT in Dec low risk  Follow up in 6 months.  Signed, Beyonca Wisz, MD  04/02/2024 10:15 AM    [facility] [address] [place], [place], [address] Phone [phone], Fax [phone]

## 2024-04-02 ENCOUNTER — Encounter: Payer: Self-pay | Admitting: Cardiology

## 2024-04-02 ENCOUNTER — Ambulatory Visit: Attending: Cardiovascular Disease | Admitting: Cardiology

## 2024-04-02 VITALS — BP 142/52 | HR 65 | Ht 63.0 in | Wt 110.5 lb

## 2024-04-02 DIAGNOSIS — I10 Essential (primary) hypertension: Secondary | ICD-10-CM | POA: Diagnosis not present

## 2024-04-02 DIAGNOSIS — I442 Atrioventricular block, complete: Secondary | ICD-10-CM | POA: Diagnosis not present

## 2024-04-02 DIAGNOSIS — Z952 Presence of prosthetic heart valve: Secondary | ICD-10-CM | POA: Diagnosis not present

## 2024-04-02 NOTE — Patient Instructions (Signed)
 Medication Instructions:  Continue same medications *If you need a refill on your cardiac medications before your next appointment, please call your pharmacy*  Lab Work: None ordered  Testing/Procedures: None ordered  Follow-Up: At [facility], you and your health needs are our priority.  As part of our continuing mission to provide you with exceptional heart care, our providers are all part of one team.  This team includes your primary Cardiologist (physician) and Advanced Practice Providers or APPs (Physician Assistants and Nurse Practitioners) who all work together to provide you with the care you need, when you need it.  Your next appointment:  6 months    Call in Feb to schedule June appointment     Provider:  Dr.Jordan   We recommend signing up for the patient portal called MyChart.  Sign up information is provided on this After Visit Summary.  MyChart is used to connect with patients for Virtual Visits (Telemedicine).  Patients are able to view lab/test results, encounter notes, upcoming appointments, etc.  Non-urgent messages can be sent to your provider as well.   To learn more about what you can do with MyChart, go to [email or web address].

## 2024-04-24 ENCOUNTER — Ambulatory Visit: Payer: Medicare PPO

## 2024-04-24 DIAGNOSIS — I442 Atrioventricular block, complete: Secondary | ICD-10-CM | POA: Diagnosis not present

## 2024-04-27 LAB — CUP PACEART REMOTE DEVICE CHECK
Battery Remaining Longevity: 99 mo
Battery Remaining Percentage: 92 %
Battery Voltage: 2.99 V
Brady Statistic AP VP Percent: 44 %
Brady Statistic AP VS Percent: 1 %
Brady Statistic AS VP Percent: 56 %
Brady Statistic AS VS Percent: 1 %
Brady Statistic RA Percent Paced: 43 %
Brady Statistic RV Percent Paced: 99 %
Date Time Interrogation Session: 20260109021243
Implantable Lead Connection Status: 753985
Implantable Lead Connection Status: 753985
Implantable Lead Implant Date: 20250226
Implantable Lead Implant Date: 20250226
Implantable Lead Location: 753859
Implantable Lead Location: 753860
Implantable Lead Model: 3830
Implantable Pulse Generator Implant Date: 20250226
Lead Channel Impedance Value: 510 Ohm
Lead Channel Impedance Value: 550 Ohm
Lead Channel Pacing Threshold Amplitude: 0.5 V
Lead Channel Pacing Threshold Amplitude: 0.625 V
Lead Channel Pacing Threshold Pulse Width: 0.5 ms
Lead Channel Pacing Threshold Pulse Width: 0.5 ms
Lead Channel Sensing Intrinsic Amplitude: 12 mV
Lead Channel Sensing Intrinsic Amplitude: 2.3 mV
Lead Channel Setting Pacing Amplitude: 1.625
Lead Channel Setting Pacing Amplitude: 2.5 V
Lead Channel Setting Pacing Pulse Width: 0.5 ms
Lead Channel Setting Sensing Sensitivity: 4 mV
Pulse Gen Model: 2272
Pulse Gen Serial Number: 8241865

## 2024-04-28 ENCOUNTER — Ambulatory Visit: Payer: Self-pay | Admitting: Cardiology

## 2024-04-28 NOTE — Progress Notes (Signed)
 Remote PPM Transmission

## 2024-09-29 ENCOUNTER — Ambulatory Visit: Admitting: Student
